# Patient Record
Sex: Female | Born: 1945 | Race: White | Hispanic: No | Marital: Married | Smoking: Never smoker
Health system: Southern US, Community
[De-identification: ages and names within clinical notes are randomized; demographics above are authoritative.]

## PROBLEM LIST (undated history)

## (undated) DIAGNOSIS — E785 Hyperlipidemia, unspecified: Secondary | ICD-10-CM

## (undated) DIAGNOSIS — I10 Essential (primary) hypertension: Secondary | ICD-10-CM

## (undated) DIAGNOSIS — M8580 Other specified disorders of bone density and structure, unspecified site: Secondary | ICD-10-CM

## (undated) DIAGNOSIS — M810 Age-related osteoporosis without current pathological fracture: Secondary | ICD-10-CM

## (undated) HISTORY — DX: Essential (primary) hypertension: I10

## (undated) HISTORY — DX: Hyperlipidemia, unspecified: E78.5

## (undated) HISTORY — DX: Age-related osteoporosis without current pathological fracture: M81.0

## (undated) HISTORY — DX: Other specified disorders of bone density and structure, unspecified site: M85.80

## (undated) HISTORY — PX: TUBAL LIGATION: SHX77

---

## 2000-01-21 ENCOUNTER — Other Ambulatory Visit: Admission: RE | Admit: 2000-01-21 | Discharge: 2000-01-21 | Payer: Self-pay | Admitting: Obstetrics and Gynecology

## 2000-01-21 ENCOUNTER — Encounter (INDEPENDENT_AMBULATORY_CARE_PROVIDER_SITE_OTHER): Payer: Self-pay

## 2003-12-13 ENCOUNTER — Encounter: Admission: RE | Admit: 2003-12-13 | Discharge: 2003-12-13 | Payer: Self-pay | Admitting: Family Medicine

## 2005-02-12 ENCOUNTER — Ambulatory Visit: Payer: Self-pay | Admitting: Family Medicine

## 2005-06-01 ENCOUNTER — Emergency Department (HOSPITAL_COMMUNITY): Admission: EM | Admit: 2005-06-01 | Discharge: 2005-06-01 | Payer: Self-pay | Admitting: Emergency Medicine

## 2005-06-04 ENCOUNTER — Encounter (HOSPITAL_COMMUNITY): Admission: RE | Admit: 2005-06-04 | Discharge: 2005-09-02 | Payer: Self-pay | Admitting: *Deleted

## 2005-09-15 ENCOUNTER — Ambulatory Visit: Payer: Self-pay | Admitting: Family Medicine

## 2005-09-16 ENCOUNTER — Ambulatory Visit: Payer: Self-pay | Admitting: Family Medicine

## 2005-09-23 ENCOUNTER — Ambulatory Visit: Payer: Self-pay | Admitting: Gastroenterology

## 2005-10-08 ENCOUNTER — Ambulatory Visit: Payer: Self-pay | Admitting: Family Medicine

## 2005-12-02 ENCOUNTER — Ambulatory Visit: Payer: Self-pay | Admitting: Gastroenterology

## 2005-12-02 LAB — HM COLONOSCOPY

## 2006-05-20 ENCOUNTER — Ambulatory Visit: Payer: Self-pay | Admitting: Internal Medicine

## 2006-11-23 ENCOUNTER — Ambulatory Visit: Payer: Self-pay | Admitting: Family Medicine

## 2006-11-23 LAB — CONVERTED CEMR LAB
ALT: 18 units/L (ref 0–40)
Alkaline Phosphatase: 46 units/L (ref 39–117)
BUN: 12 mg/dL (ref 6–23)
Basophils Relative: 0.9 % (ref 0.0–1.0)
Calcium: 9.2 mg/dL (ref 8.4–10.5)
GFR calc Af Amer: 94 mL/min
Glucose, Bld: 92 mg/dL (ref 70–99)
Hemoglobin: 12.6 g/dL (ref 12.0–15.0)
Lymphocytes Relative: 26.3 % (ref 12.0–46.0)
Platelets: 304 10*3/uL (ref 150–400)
Potassium: 3.7 meq/L (ref 3.5–5.1)
TSH: 1.81 microintl units/mL (ref 0.35–5.50)
Total Protein: 6.8 g/dL (ref 6.0–8.3)
WBC: 5.1 10*3/uL (ref 4.5–10.5)

## 2007-02-20 ENCOUNTER — Ambulatory Visit: Payer: Self-pay | Admitting: Family Medicine

## 2007-02-20 LAB — CONVERTED CEMR LAB
ALT: 21 units/L (ref 0–40)
AST: 22 units/L (ref 0–37)
Albumin: 4.1 g/dL (ref 3.5–5.2)
Alkaline Phosphatase: 51 units/L (ref 39–117)
BUN: 16 mg/dL (ref 6–23)
Bilirubin, Direct: 0.1 mg/dL (ref 0.0–0.3)
Calcium: 9.2 mg/dL (ref 8.4–10.5)
Chloride: 104 meq/L (ref 96–112)
GFR calc non Af Amer: 91 mL/min
Glucose, Bld: 94 mg/dL (ref 70–99)

## 2007-02-28 DIAGNOSIS — Z9189 Other specified personal risk factors, not elsewhere classified: Secondary | ICD-10-CM | POA: Insufficient documentation

## 2007-02-28 DIAGNOSIS — E785 Hyperlipidemia, unspecified: Secondary | ICD-10-CM | POA: Insufficient documentation

## 2007-02-28 DIAGNOSIS — I10 Essential (primary) hypertension: Secondary | ICD-10-CM | POA: Insufficient documentation

## 2007-02-28 DIAGNOSIS — Z8719 Personal history of other diseases of the digestive system: Secondary | ICD-10-CM | POA: Insufficient documentation

## 2007-06-27 ENCOUNTER — Ambulatory Visit: Payer: Self-pay | Admitting: Family Medicine

## 2007-06-27 DIAGNOSIS — J209 Acute bronchitis, unspecified: Secondary | ICD-10-CM | POA: Insufficient documentation

## 2007-06-28 ENCOUNTER — Ambulatory Visit: Payer: Self-pay | Admitting: Family Medicine

## 2007-09-04 ENCOUNTER — Ambulatory Visit: Payer: Self-pay | Admitting: Family Medicine

## 2007-09-07 LAB — CONVERTED CEMR LAB
LDL Cholesterol: 89 mg/dL (ref 0–99)
Total CHOL/HDL Ratio: 2.5
VLDL: 10 mg/dL (ref 0–40)

## 2007-09-26 ENCOUNTER — Emergency Department (HOSPITAL_COMMUNITY): Admission: EM | Admit: 2007-09-26 | Discharge: 2007-09-26 | Payer: Self-pay | Admitting: *Deleted

## 2007-09-26 ENCOUNTER — Telehealth: Payer: Self-pay | Admitting: Family Medicine

## 2007-10-10 ENCOUNTER — Ambulatory Visit: Payer: Self-pay | Admitting: Family Medicine

## 2007-10-10 DIAGNOSIS — M542 Cervicalgia: Secondary | ICD-10-CM | POA: Insufficient documentation

## 2008-01-05 ENCOUNTER — Telehealth (INDEPENDENT_AMBULATORY_CARE_PROVIDER_SITE_OTHER): Payer: Self-pay | Admitting: *Deleted

## 2008-01-16 ENCOUNTER — Telehealth (INDEPENDENT_AMBULATORY_CARE_PROVIDER_SITE_OTHER): Payer: Self-pay | Admitting: *Deleted

## 2008-02-08 ENCOUNTER — Telehealth (INDEPENDENT_AMBULATORY_CARE_PROVIDER_SITE_OTHER): Payer: Self-pay | Admitting: *Deleted

## 2008-02-20 ENCOUNTER — Telehealth (INDEPENDENT_AMBULATORY_CARE_PROVIDER_SITE_OTHER): Payer: Self-pay | Admitting: *Deleted

## 2008-03-12 ENCOUNTER — Telehealth (INDEPENDENT_AMBULATORY_CARE_PROVIDER_SITE_OTHER): Payer: Self-pay | Admitting: *Deleted

## 2008-03-25 ENCOUNTER — Encounter: Payer: Self-pay | Admitting: Family Medicine

## 2008-05-16 ENCOUNTER — Ambulatory Visit: Payer: Self-pay | Admitting: Family Medicine

## 2008-05-20 ENCOUNTER — Encounter (INDEPENDENT_AMBULATORY_CARE_PROVIDER_SITE_OTHER): Payer: Self-pay | Admitting: *Deleted

## 2008-05-20 LAB — CONVERTED CEMR LAB
AST: 21 units/L (ref 0–37)
Albumin: 4 g/dL (ref 3.5–5.2)
Alkaline Phosphatase: 43 units/L (ref 39–117)
Bilirubin, Direct: 0.1 mg/dL (ref 0.0–0.3)
Cholesterol: 141 mg/dL (ref 0–200)
Total Protein: 6.5 g/dL (ref 6.0–8.3)
Triglycerides: 40 mg/dL (ref 0–149)

## 2008-05-21 ENCOUNTER — Telehealth (INDEPENDENT_AMBULATORY_CARE_PROVIDER_SITE_OTHER): Payer: Self-pay | Admitting: *Deleted

## 2008-06-04 ENCOUNTER — Telehealth (INDEPENDENT_AMBULATORY_CARE_PROVIDER_SITE_OTHER): Payer: Self-pay | Admitting: *Deleted

## 2008-06-04 ENCOUNTER — Ambulatory Visit: Payer: Self-pay | Admitting: Family Medicine

## 2008-06-28 ENCOUNTER — Telehealth (INDEPENDENT_AMBULATORY_CARE_PROVIDER_SITE_OTHER): Payer: Self-pay | Admitting: *Deleted

## 2008-07-09 ENCOUNTER — Telehealth: Payer: Self-pay | Admitting: Family Medicine

## 2008-07-19 ENCOUNTER — Telehealth (INDEPENDENT_AMBULATORY_CARE_PROVIDER_SITE_OTHER): Payer: Self-pay | Admitting: *Deleted

## 2008-07-26 ENCOUNTER — Ambulatory Visit: Payer: Self-pay | Admitting: Family Medicine

## 2008-07-26 DIAGNOSIS — N951 Menopausal and female climacteric states: Secondary | ICD-10-CM | POA: Insufficient documentation

## 2008-07-29 ENCOUNTER — Encounter: Admission: RE | Admit: 2008-07-29 | Discharge: 2008-07-29 | Payer: Self-pay | Admitting: Family Medicine

## 2008-07-30 ENCOUNTER — Telehealth (INDEPENDENT_AMBULATORY_CARE_PROVIDER_SITE_OTHER): Payer: Self-pay | Admitting: *Deleted

## 2008-08-01 ENCOUNTER — Encounter (INDEPENDENT_AMBULATORY_CARE_PROVIDER_SITE_OTHER): Payer: Self-pay | Admitting: *Deleted

## 2008-08-07 ENCOUNTER — Encounter (INDEPENDENT_AMBULATORY_CARE_PROVIDER_SITE_OTHER): Payer: Self-pay | Admitting: *Deleted

## 2008-08-12 ENCOUNTER — Encounter: Payer: Self-pay | Admitting: Family Medicine

## 2008-08-14 ENCOUNTER — Ambulatory Visit: Payer: Self-pay | Admitting: Family Medicine

## 2008-08-26 ENCOUNTER — Telehealth (INDEPENDENT_AMBULATORY_CARE_PROVIDER_SITE_OTHER): Payer: Self-pay | Admitting: *Deleted

## 2008-08-27 ENCOUNTER — Telehealth (INDEPENDENT_AMBULATORY_CARE_PROVIDER_SITE_OTHER): Payer: Self-pay | Admitting: *Deleted

## 2009-01-08 ENCOUNTER — Telehealth (INDEPENDENT_AMBULATORY_CARE_PROVIDER_SITE_OTHER): Payer: Self-pay | Admitting: *Deleted

## 2009-02-24 ENCOUNTER — Encounter: Payer: Self-pay | Admitting: Family Medicine

## 2009-02-28 ENCOUNTER — Ambulatory Visit: Payer: Self-pay | Admitting: Family Medicine

## 2009-03-04 ENCOUNTER — Encounter (INDEPENDENT_AMBULATORY_CARE_PROVIDER_SITE_OTHER): Payer: Self-pay | Admitting: *Deleted

## 2009-03-05 ENCOUNTER — Encounter: Payer: Self-pay | Admitting: Family Medicine

## 2009-03-17 ENCOUNTER — Encounter (INDEPENDENT_AMBULATORY_CARE_PROVIDER_SITE_OTHER): Payer: Self-pay | Admitting: *Deleted

## 2009-04-12 ENCOUNTER — Ambulatory Visit: Payer: Self-pay | Admitting: Internal Medicine

## 2009-04-12 ENCOUNTER — Telehealth: Payer: Self-pay | Admitting: Internal Medicine

## 2009-04-12 DIAGNOSIS — N390 Urinary tract infection, site not specified: Secondary | ICD-10-CM | POA: Insufficient documentation

## 2009-04-12 LAB — CONVERTED CEMR LAB
Glucose, Urine, Semiquant: NEGATIVE
Ketones, urine, test strip: NEGATIVE
Nitrite: NEGATIVE

## 2009-09-22 ENCOUNTER — Ambulatory Visit: Payer: Self-pay | Admitting: Family Medicine

## 2009-10-09 ENCOUNTER — Ambulatory Visit: Payer: Self-pay | Admitting: Family Medicine

## 2009-10-09 DIAGNOSIS — J019 Acute sinusitis, unspecified: Secondary | ICD-10-CM | POA: Insufficient documentation

## 2009-10-16 ENCOUNTER — Telehealth: Payer: Self-pay | Admitting: Family Medicine

## 2009-10-20 ENCOUNTER — Telehealth: Payer: Self-pay | Admitting: Family Medicine

## 2009-10-20 ENCOUNTER — Telehealth (INDEPENDENT_AMBULATORY_CARE_PROVIDER_SITE_OTHER): Payer: Self-pay | Admitting: *Deleted

## 2009-11-13 ENCOUNTER — Telehealth: Payer: Self-pay | Admitting: Family Medicine

## 2010-01-10 ENCOUNTER — Emergency Department (HOSPITAL_COMMUNITY): Admission: EM | Admit: 2010-01-10 | Discharge: 2010-01-10 | Payer: Self-pay | Admitting: Emergency Medicine

## 2010-01-12 ENCOUNTER — Encounter: Payer: Self-pay | Admitting: Family Medicine

## 2010-01-12 ENCOUNTER — Telehealth: Payer: Self-pay | Admitting: Family Medicine

## 2010-01-23 ENCOUNTER — Telehealth (INDEPENDENT_AMBULATORY_CARE_PROVIDER_SITE_OTHER): Payer: Self-pay | Admitting: *Deleted

## 2010-01-30 ENCOUNTER — Encounter: Payer: Self-pay | Admitting: Family Medicine

## 2010-03-03 ENCOUNTER — Ambulatory Visit: Payer: Self-pay | Admitting: Family Medicine

## 2010-03-03 ENCOUNTER — Encounter (INDEPENDENT_AMBULATORY_CARE_PROVIDER_SITE_OTHER): Payer: Self-pay | Admitting: *Deleted

## 2010-03-03 DIAGNOSIS — M949 Disorder of cartilage, unspecified: Secondary | ICD-10-CM

## 2010-03-03 DIAGNOSIS — M899 Disorder of bone, unspecified: Secondary | ICD-10-CM | POA: Insufficient documentation

## 2010-03-20 ENCOUNTER — Encounter: Payer: Self-pay | Admitting: Family Medicine

## 2010-03-27 ENCOUNTER — Ambulatory Visit: Payer: Self-pay | Admitting: Family Medicine

## 2010-03-27 LAB — CONVERTED CEMR LAB: Fecal Occult Bld: NEGATIVE

## 2010-07-24 ENCOUNTER — Ambulatory Visit: Payer: Self-pay | Admitting: Family Medicine

## 2010-10-09 ENCOUNTER — Telehealth (INDEPENDENT_AMBULATORY_CARE_PROVIDER_SITE_OTHER): Payer: Self-pay | Admitting: *Deleted

## 2010-10-12 ENCOUNTER — Telehealth: Payer: Self-pay | Admitting: Family Medicine

## 2010-10-14 ENCOUNTER — Ambulatory Visit: Payer: Self-pay | Admitting: Family Medicine

## 2010-10-19 LAB — CONVERTED CEMR LAB
ALT: 16 units/L (ref 0–35)
Cholesterol: 154 mg/dL (ref 0–200)
LDL Cholesterol: 94 mg/dL (ref 0–99)
Total Bilirubin: 0.3 mg/dL (ref 0.3–1.2)
Triglycerides: 39 mg/dL (ref 0.0–149.0)

## 2010-12-06 LAB — CONVERTED CEMR LAB
ALT: 21 units/L (ref 0–35)
AST: 23 units/L (ref 0–37)
Albumin: 4.3 g/dL (ref 3.5–5.2)
BUN: 11 mg/dL (ref 6–23)
BUN: 13 mg/dL (ref 6–23)
BUN: 13 mg/dL (ref 6–23)
Basophils Absolute: 0 10*3/uL (ref 0.0–0.1)
Bilirubin Urine: NEGATIVE
Bilirubin, Direct: 0.1 mg/dL (ref 0.0–0.3)
CO2: 30 meq/L (ref 19–32)
CO2: 31 meq/L (ref 19–32)
Chloride: 103 meq/L (ref 96–112)
Chloride: 99 meq/L (ref 96–112)
Cholesterol: 149 mg/dL (ref 0–200)
Cholesterol: 157 mg/dL (ref 0–200)
Creatinine, Ser: 0.6 mg/dL (ref 0.4–1.2)
Eosinophils Absolute: 0.2 10*3/uL (ref 0.0–0.7)
Eosinophils Absolute: 0.3 10*3/uL (ref 0.0–0.7)
Eosinophils Absolute: 0.3 10*3/uL (ref 0.0–0.7)
Eosinophils Relative: 5 % (ref 0.0–5.0)
Eosinophils Relative: 5.4 % — ABNORMAL HIGH (ref 0.0–5.0)
GFR calc Af Amer: 131 mL/min
GFR calc non Af Amer: 108 mL/min
Glucose, Bld: 77 mg/dL (ref 70–99)
Glucose, Bld: 84 mg/dL (ref 70–99)
Glucose, Urine, Semiquant: NEGATIVE
HCT: 35.5 % — ABNORMAL LOW (ref 36.0–46.0)
HCT: 35.9 % — ABNORMAL LOW (ref 36.0–46.0)
Hemoglobin: 12.2 g/dL (ref 12.0–15.0)
Ketones, urine, test strip: NEGATIVE
LDL Cholesterol: 82 mg/dL (ref 0–99)
Lymphs Abs: 1.3 10*3/uL (ref 0.7–4.0)
Lymphs Abs: 1.3 10*3/uL (ref 0.7–4.0)
MCHC: 34.4 g/dL (ref 30.0–36.0)
MCHC: 34.7 g/dL (ref 30.0–36.0)
MCV: 94.9 fL (ref 78.0–100.0)
MCV: 95 fL (ref 78.0–100.0)
MCV: 95.4 fL (ref 78.0–100.0)
Monocytes Absolute: 0.4 10*3/uL (ref 0.1–1.0)
Monocytes Absolute: 0.5 10*3/uL (ref 0.1–1.0)
Monocytes Absolute: 0.6 10*3/uL (ref 0.1–1.0)
Monocytes Relative: 9.3 % (ref 3.0–12.0)
Neutro Abs: 2.7 10*3/uL (ref 1.4–7.7)
Neutrophils Relative %: 57.8 % (ref 43.0–77.0)
Neutrophils Relative %: 63.3 % (ref 43.0–77.0)
Nitrite: NEGATIVE
Pap Smear: NORMAL
Platelets: 259 10*3/uL (ref 150.0–400.0)
Platelets: 319 10*3/uL (ref 150–400)
Potassium: 3.6 meq/L (ref 3.5–5.1)
Potassium: 3.7 meq/L (ref 3.5–5.1)
Potassium: 4 meq/L (ref 3.5–5.1)
RDW: 12.5 % (ref 11.5–14.6)
RDW: 13 % (ref 11.5–14.6)
RDW: 14.3 % (ref 11.5–14.6)
Sodium: 139 meq/L (ref 135–145)
Sodium: 139 meq/L (ref 135–145)
Specific Gravity, Urine: <1.005
TSH: 1.4 microintl units/mL (ref 0.35–5.50)
TSH: 1.45 microintl units/mL (ref 0.35–5.50)
Total Bilirubin: 0.7 mg/dL (ref 0.3–1.2)
Total Protein: 6.7 g/dL (ref 6.0–8.3)
Triglycerides: 54 mg/dL (ref 0.0–149.0)
Urobilinogen, UA: 0.2
Urobilinogen, UA: NEGATIVE
Vit D, 25-Hydroxy: 65 ng/mL (ref 30–89)
Vit D, 25-Hydroxy: 77 ng/mL (ref 30–89)
WBC Urine, dipstick: NEGATIVE
WBC: 5.9 10*3/uL (ref 4.5–10.5)
WBC: 6 10*3/uL (ref 4.5–10.5)
pH: 6
pH: 8

## 2010-12-10 NOTE — Progress Notes (Signed)
Summary: REFILL  Phone Note Refill Request Message from:  Patient on October 09, 2010 11:58 AM  Refills Requested: Medication #1:  LIPITOR 20 MG  TABS 1 by mouth at bedtime [facility] - 90 DAY  SUPPLY FAX  [phone]  Initial call taken by: Okey Regal Spring,  October 09, 2010 12:00 PM    New/Updated Medications: LIPITOR 20 MG  TABS (ATORVASTATIN CALCIUM) 1 by mouth at bedtime ** LABS ARE DUE NOW***  Prescriptions: LIPITOR 20 MG  TABS (ATORVASTATIN CALCIUM) 1 by mouth at bedtime ** LABS ARE DUE NOW***  #90 x 0   Entered by:   Almeta Monas CMA (AAMA)   Authorized by:   Loreen Freud DO   Signed by:   Almeta Monas CMA (AAMA) on 10/09/2010   Method used:   Electronically to        [facility]* (retail)             ,          Ph: [phone]       Fax: [phone]   RxID:   [id number]

## 2010-12-10 NOTE — Progress Notes (Signed)
Summary: Lipitor  Phone Note Call from Patient Call back at Work Phone [phone]   Summary of Call: Patient called for samples of Lipitor.   I called to notify that we do not get samples of the med anymore, left message on voicemail to call back to office.  Initial call taken by: Lucious Groves CMA,  October 12, 2010 9:56 AM  Follow-up for Phone Call        Patient notified and prescription sent. Follow-up by: Lucious Groves CMA,  October 12, 2010 10:30 AM    Prescriptions: LIPITOR 20 MG  TABS (ATORVASTATIN CALCIUM) 1 by mouth at bedtime ** LABS ARE DUE NOW***  #30 x 0   Entered by:   Lucious Groves CMA   Authorized by:   Loreen Freud DO   Signed by:   Lucious Groves CMA on 10/12/2010   Method used:   Electronically to        [phone][facility] (retail)       [address] [place], [place]  [address]       Ph: [phone]       Fax: [phone]   RxID:   [id number]

## 2010-12-10 NOTE — Assessment & Plan Note (Signed)
Summary: cpx//lch   Vital Signs:  Patient profile:   65 year old female Height:      62 inches Weight:      123 pounds BMI:     22.58 Pulse rate:   86 / minute Pulse rhythm:   regular BP sitting:   124 / 80  (left arm) Cuff size:   regular  Vitals Entered By: Army Fossa CMA (March 03, 2010 9:01 AM) CC: Pt here for CPX. no pap. has a gyn.   History of Present Illness: Pt here for cpe and labs.  Pt has gyn.   No complaints.    Preventive Screening-Counseling & Management  Alcohol-Tobacco     Alcohol drinks/day: <1     Alcohol type: wine     Smoking Status: never     Passive Smoke Exposure: no  Caffeine-Diet-Exercise     Caffeine use/day: 6 cups coffee     Caffeine Counseling: decrease use of caffeine     Does Patient Exercise: yes     Type of exercise: walking, yoga, curves     Exercise (avg: min/session): 30-60     Times/week: 5     Exercise Counseling: not indicated; exercise is adequate  Hep-HIV-STD-Contraception     HIV Risk: no     Dental Visit-last 6 months yes     Dental Care Counseling: not indicated; dental care within six months     SBE monthly: yes     SBE Education/Counseling: not indicated; SBE done regularly     Sun Exposure-Excessive: no     Sun Exposure Counseling: not indicated; sun exposure is acceptable  Safety-Violence-Falls     Seat Belt Use: yes     Firearms in the Home: no firearms in the home     Smoke Detectors: yes     Violence in the Home: no risk noted     Sexual Abuse: no  Current Medications (verified): 1)  Aspirin Ec Lo-Dose 81 Mg Tbec (Aspirin) 2)  Wellbutrin Xl 150 Mg Xr24h-Tab (Bupropion Hcl) .Marland Kitchen.. 1 By Mouth Qam. 3)  Lisinopril-Hydrochlorothiazide 20-25 Mg Tabs (Lisinopril-Hydrochlorothiazide) .... Take One Tablet Daily 4)  Lipitor 20 Mg  Tabs (Atorvastatin Calcium) .Marland Kitchen.. 1 By Mouth At Bedtime 5)  Vit-E 400 .Marland Kitchen.. 1 By Mouth Once Daily 6)  Fish Oil 1000 Mg  Caps (Omega-3 Fatty Acids) .Marland Kitchen.. 1 By Mouth Once Daily 7)  Soma 250  Mg  Tabs (Carisoprodol) .Marland Kitchen.. 1 By Mouth At Bedtime As Needed 8)  Claritin 10 Mg Caps (Loratadine) .Marland Kitchen.. 1 By Mouth Once Daily As Needed 9)  Vagifem 25 Mcg Tabs (Estradiol) .... Use As Directed 10)  Vitamin D 1000 Unit Tabs (Cholecalciferol) 11)  Calcium 1500 Mg Tabs (Calcium Carbonate) 12)  Vivelle-Dot 0.0375 Mg/24hr Pttw (Estradiol) 13)  Nasacort Aq 55 Mcg/act Aers (Triamcinolone Acetonide(Nasal)) .... 2 Sprays Each Nostril Once Daily  Allergies (verified): No Known Drug Allergies  Past History:  Past Medical History: Last updated: 07/26/2008 Hyperlipidemia Hypertension Current Problems:  PREVENTIVE HEALTH CARE (ICD-V70.0) FAMILY HISTORY DIABETES 1ST DEGREE RELATIVE (ICD-V18.0) FAMILY HISTORY OF CERVICAL CANCER (ICD-V17.3) FAMILY HISTORY OF CAD FEMALE 1ST DEGREE RELATIVE <50 (ICD-V17.3) FAMILY HISTORY OF CAD FEMALE 1ST DEGREE RELATIVE <60 (ICD-V16.49) NECK PAIN, LEFT (ICD-723.1) BRONCHITIS, ACUTE (ICD-466.0) BREAST BIOPSY, HX OF (ICD-V15.9) COLONOSCOPY, HX OF (ICD-V12.79) S/P PT DOC HAVE DEXA (CPT-G8340) HYPERTENSION (ICD-401.9) HYPERLIPIDEMIA (ICD-272.4)  Past Surgical History: Last updated: 07/26/2008 Denies surgical history  Family History: Last updated: 02/28/2009 Family History of CAD Female 1st degree relative <60 Family  History of CAD Female 1st degree relative <50 Family History of Cervical cancer Family History Diabetes 1st degree relative Family History Hypertension Family History Lung cancer M-dementia  Social History: Last updated: 07/26/2008 Never Smoked Alcohol use-yes Retired Drug use-no Married Regular exercise-no  Risk Factors: Alcohol Use: <1 (03/03/2010) Caffeine Use: 6 cups coffee (03/03/2010) Exercise: yes (03/03/2010)  Risk Factors: Smoking Status: never (03/03/2010) Passive Smoke Exposure: no (03/03/2010)  Family History: Reviewed history from 02/28/2009 and no changes required. Family History of CAD Female 1st degree relative  <60 Family History of CAD Female 1st degree relative <50 Family History of Cervical cancer Family History Diabetes 1st degree relative Family History Hypertension Family History Lung cancer M-dementia  Social History: Reviewed history from 07/26/2008 and no changes required. Never Smoked Alcohol use-yes Retired Drug use-no Married Regular exercise-no  Review of Systems      See HPI General:  Denies chills, fatigue, fever, loss of appetite, malaise, sleep disorder, sweats, weakness, and weight loss. Eyes:  Denies blurring, discharge, double vision, eye irritation, eye pain, halos, itching, light sensitivity, red eye, vision loss-1 eye, and vision loss-both eyes; optho--no. ENT:  Denies decreased hearing, difficulty swallowing, ear discharge, earache, hoarseness, nasal congestion, nosebleeds, postnasal drainage, ringing in ears, sinus pressure, and sore throat. CV:  Denies bluish discoloration of lips or nails, chest pain or discomfort, difficulty breathing at night, difficulty breathing while lying down, fainting, fatigue, leg cramps with exertion, lightheadness, near fainting, palpitations, shortness of breath with exertion, swelling of feet, swelling of hands, and weight gain. Resp:  Denies chest discomfort, chest pain with inspiration, cough, coughing up blood, excessive snoring, hypersomnolence, morning headaches, pleuritic, shortness of breath, sputum productive, and wheezing. GI:  Denies abdominal pain, bloody stools, change in bowel habits, constipation, dark tarry stools, diarrhea, excessive appetite, gas, hemorrhoids, indigestion, and loss of appetite. GU:  Denies abnormal vaginal bleeding, decreased libido, discharge, dysuria, genital sores, hematuria, incontinence, nocturia, urinary frequency, and urinary hesitancy. MS:  Denies joint pain, joint redness, joint swelling, loss of strength, low back pain, mid back pain, muscle aches, muscle , cramps, muscle weakness, stiffness, and  thoracic pain. Derm:  Denies changes in color of skin, changes in nail beds, dryness, excessive perspiration, flushing, hair loss, insect bite(s), itching, lesion(s), poor wound healing, and rash. Neuro:  Denies brief paralysis, difficulty with concentration, disturbances in coordination, falling down, headaches, inability to speak, memory loss, numbness, poor balance, seizures, sensation of room spinning, tingling, tremors, visual disturbances, and weakness. Psych:  Denies alternate hallucination ( auditory/visual), anxiety, depression, easily angered, easily tearful, irritability, mental problems, panic attacks, sense of great danger, suicidal thoughts/plans, thoughts of violence, unusual visions or sounds, and thoughts /plans of harming others. Endo:  Denies cold intolerance, excessive hunger, excessive thirst, excessive urination, heat intolerance, polyuria, and weight change. Heme:  Denies abnormal bruising, bleeding, enlarge lymph nodes, fevers, pallor, and skin discoloration. Allergy:  Denies hives or rash, itching eyes, persistent infections, seasonal allergies, and sneezing.  Physical Exam  General:  Well-developed,well-nourished,in no acute distress; alert,appropriate and cooperative throughout examination Head:  Normocephalic and atraumatic without obvious abnormalities. No apparent alopecia or balding. Eyes:  pupils equal, pupils round, pupils reactive to light, and no injection.   Ears:  + tubes b/L no external deformities.   Nose:  External nasal examination shows no deformity or inflammation. Nasal mucosa are pink and moist without lesions or exudates. Mouth:  Oral mucosa and oropharynx without lesions or exudates.  Teeth in good repair. Neck:  No deformities, masses,  or tenderness noted. Breasts:  gyn Lungs:  Normal respiratory effort, chest expands symmetrically. Lungs are clear to auscultation, no crackles or wheezes. Heart:  normal rate and no murmur.   Abdomen:  Bowel sounds  positive,abdomen soft and non-tender without masses, organomegaly or hernias noted. Rectal:  gyn Genitalia:  gyn Msk:  normal ROM, no joint tenderness, no joint swelling, no joint warmth, no redness over joints, no joint deformities, no joint instability, and no crepitation.   Pulses:  R posterior tibial normal, R dorsalis pedis normal, R carotid normal, L posterior tibial normal, L dorsalis pedis normal, and L carotid normal.   Extremities:  No clubbing, cyanosis, edema, or deformity noted with normal full range of motion of all joints.   Neurologic:  No cranial nerve deficits noted. Station and gait are normal. Plantar reflexes are down-going bilaterally. DTRs are symmetrical throughout. Sensory, motor and coordinative functions appear intact. Skin:  Intact without suspicious lesions or rashes Cervical Nodes:  No lymphadenopathy noted Psych:  Cognition and judgment appear intact. Alert and cooperative with normal attention span and concentration. No apparent delusions, illusions, hallucinations   Impression & Recommendations:  Problem # 1:  PREVENTIVE HEALTH CARE (ICD-V70.0)  Orders: Venipuncture (45409) TLB-Lipid Panel (80061-LIPID) TLB-BMP (Basic Metabolic Panel-BMET) (80048-METABOL) TLB-CBC Platelet - w/Differential (85025-CBCD) TLB-Hepatic/Liver Function Pnl (80076-HEPATIC) TLB-TSH (Thyroid Stimulating Hormone) (84443-TSH) T-Vitamin D (25-Hydroxy) (81191-47829) EKG w/ Interpretation (93000)  Problem # 2:  OSTEOPENIA (ICD-733.90)  Her updated medication list for this problem includes:    Vitamin D 1000 Unit Tabs (Cholecalciferol)    Calcium 1500 Mg Tabs (Calcium carbonate)  Bone Density: Location:  [facility].    (03/16/2009) Vit D:77 (02/28/2009)  Orders: EKG w/ Interpretation (93000)  Problem # 3:  POSTMENOPAUSAL STATUS (ICD-627.2)  Her updated medication list for this problem includes:    Vagifem 25 Mcg Tabs (Estradiol) ..... Use as directed     Vivelle-dot 0.0375 Mg/24hr Pttw (Estradiol)  Orders: Venipuncture ([address]) TLB-Lipid Panel (80061-LIPID) TLB-BMP (Basic Metabolic Panel-BMET) (80048-METABOL) TLB-CBC Platelet - w/Differential (85025-CBCD) TLB-Hepatic/Liver Function Pnl (80076-HEPATIC) TLB-TSH (Thyroid Stimulating Hormone) (84443-TSH) T-Vitamin D (25-Hydroxy) (08657-84696) EKG w/ Interpretation (93000)  Discussed treatment options.   Problem # 4:  HYPERTENSION (ICD-401.9)  Her updated medication list for this problem includes:    Lisinopril-hydrochlorothiazide 20-25 Mg Tabs (Lisinopril-hydrochlorothiazide) .Marland Kitchen... Take one tablet daily  Orders: Venipuncture (29528) TLB-Lipid Panel (80061-LIPID) TLB-BMP (Basic Metabolic Panel-BMET) (80048-METABOL) TLB-CBC Platelet - w/Differential (85025-CBCD) TLB-Hepatic/Liver Function Pnl (80076-HEPATIC) TLB-TSH (Thyroid Stimulating Hormone) (84443-TSH) T-Vitamin D (25-Hydroxy) (41324-40102) EKG w/ Interpretation (93000)  BP today: 124/80 Prior BP: 120/80 (10/09/2009)  Labs Reviewed: K+: 3.7 (02/28/2009) Creat: : 0.6 (02/28/2009)   Chol: 157 (02/28/2009)   HDL: 63.80 (02/28/2009)   LDL: 82 (02/28/2009)   TG: 54.0 (02/28/2009)  Problem # 5:  HYPERLIPIDEMIA (ICD-272.4)  Her updated medication list for this problem includes:    Lipitor 20 Mg Tabs (Atorvastatin calcium) .Marland Kitchen... 1 by mouth at bedtime  Orders: Venipuncture (72536) TLB-Lipid Panel (80061-LIPID) TLB-BMP (Basic Metabolic Panel-BMET) (80048-METABOL) TLB-CBC Platelet - w/Differential (85025-CBCD) TLB-Hepatic/Liver Function Pnl (80076-HEPATIC) TLB-TSH (Thyroid Stimulating Hormone) (84443-TSH) T-Vitamin D (25-Hydroxy) (64403-47425) EKG w/ Interpretation (93000)  Labs Reviewed: SGOT: 24 (02/28/2009)   SGPT: 21 (02/28/2009)   HDL:63.80 (02/28/2009), 62.5 (05/16/2008)  LDL:82 (02/28/2009), 71 (05/16/2008)  Chol:157 (02/28/2009), 141 (05/16/2008)  Trig:54.0 (02/28/2009), 40 (05/16/2008)  Complete Medication List: 1)   Aspirin Ec Lo-dose 81 Mg Tbec (Aspirin) 2)  Wellbutrin Xl 150 Mg Xr24h-tab (Bupropion hcl) .Marland Kitchen.. 1 by mouth qam. 3)  Lisinopril-hydrochlorothiazide  20-25 Mg Tabs (Lisinopril-hydrochlorothiazide) .... Take one tablet daily 4)  Lipitor 20 Mg Tabs (Atorvastatin calcium) .Marland Kitchen.. 1 by mouth at bedtime 5)  Vit-e 400  .Marland KitchenMarland Kitchen. 1 by mouth once daily 6)  Fish Oil 1000 Mg Caps (Omega-3 fatty acids) .Marland Kitchen.. 1 by mouth once daily 7)  Soma 250 Mg Tabs (Carisoprodol) .Marland Kitchen.. 1 by mouth at bedtime as needed 8)  Claritin 10 Mg Caps (Loratadine) .Marland Kitchen.. 1 by mouth once daily as needed 9)  Vagifem 25 Mcg Tabs (Estradiol) .... Use as directed 10)  Vitamin D 1000 Unit Tabs (Cholecalciferol) 11)  Calcium 1500 Mg Tabs (Calcium carbonate) 12)  Vivelle-dot 0.0375 Mg/24hr Pttw (Estradiol) 13)  Nasacort Aq 55 Mcg/act Aers (Triamcinolone acetonide(nasal)) .... 2 sprays each nostril once daily Prescriptions: LIPITOR 20 MG  TABS (ATORVASTATIN CALCIUM) 1 by mouth at bedtime  #30 x 5   Entered and Authorized by:   Loreen Freud DO   Signed by:   Loreen Freud DO on 03/03/2010   Method used:   Print then Give to Patient   RxID:   [id number]    EKG  Procedure date:  03/03/2010  Findings:      Sinus bradycardia with rate of:  58  Bone Density  Procedure date:  03/16/2009  Findings:      Location:  [facility].     Comments:      Assessment:  Osteopenia.     PAP Result Date:  02/19/2009 PAP Result:  normal PAP Next Due:  1 yr Last Mammogram:  Normal Bilateral (05/22/2008 2:02:23 PM) Mammogram Result Date:  03/02/2010 Mammogram Result:  normal Mammogram Next Due:  1 yr      Appended Document: cpx//lch  Laboratory Results   Urine Tests   Date/Time Reported: March 03, 2010 1:48 PM   Routine Urinalysis   Color: yellow Appearance: Clear Glucose: negative   (Normal Range: Negative) Bilirubin: negative   (Normal Range: Negative) Ketone: negative   (Normal Range: Negative) Spec. Gravity:  <1.005   (Normal Range: 1.003-1.035) Blood: negative   (Normal Range: Negative) pH: 6.0   (Normal Range: 5.0-8.0) Protein: negative   (Normal Range: Negative) Urobilinogen: negative   (Normal Range: 0-1) Nitrite: negative   (Normal Range: Negative) Leukocyte Esterace: negative   (Normal Range: Negative)    Comments: Floydene Flock  March 03, 2010 1:49 PM

## 2010-12-10 NOTE — Letter (Signed)
Summary: [facility]: Immunoassay Fecal Occult Blood (iFOB) Order Form  [facility] [place]  [address] [place], [place] [address]   Phone: [phone]  Fax: [phone]      [facility]: Immunoassay Fecal Occult Blood (iFOB) Order Form   March 03, 2010 MRN: [id number]   Natasha Trujillo [birth year]   Physicican Name:___Yvonne Lowne,DO______________________  Diagnosis Code:______v76.51____________________      Army Fossa CMA

## 2010-12-10 NOTE — Consult Note (Signed)
Summary: [facility]  [facility]   Imported By: Lanelle Bal 01/19/2010 09:31:45  _____________________________________________________________________  External Attachment:    Type:   Image     Comment:   External Document

## 2010-12-10 NOTE — Medication Information (Signed)
Summary: Noncompliance with Triamterene/[employer]  Noncompliance with Triamterene/[employer]   Imported By: Lanelle Bal 02/02/2010 10:02:20  _____________________________________________________________________  External Attachment:    Type:   Image     Comment:   External Document

## 2010-12-10 NOTE — Progress Notes (Signed)
Summary: [name]  Phone Note Outgoing Call   Summary of Call: Triage Call Report Triage Record Num: [id number] Operator: Dayton Martes Patient Name: Natasha Trujillo Call Date & Time: 01/10/2010 7:18:03PM Patient Phone: [phone] PCP: Lelon Perla Patient Gender: Female PCP Fax : Patient DOB: [birth year] Practice Name: [facility] Reason for Call: Pt sts that she has had nasal congestion since 01/08/10 and earache; onset tonight. Denies urgent emergent sx's. Care adv given. PER STANDING ORDERS -A/B Otic: instill 2-3 gtts affected ear Q 2-3 hrs prn pain. [facility] @ [phone]. Adv UC in am. Protocol(s) Used: Ear - Pain/Injury/Foreign Body Recommended Outcome per Protocol: See Provider within 24 hours Reason for Outcome: Constant or intermittent dull earache, throbbing in ear(s) or feeling of fullness; may interfere with sleep or ability to carry out normal activities Care Advice: A warm washcloth or heating pad set on low to the affected ear may help relieve the discomfort. May apply for 15 to 20 minutes, 3 to 4 times a day.  ~ Speak with provider as soon as possible if having: - discharge from ear that does not look like earwax or that has bad odor - increased redness or swelling of external ear - worsening pain - new or worsening dizziness - or unsteadiness.  ~ Resting or sleeping with head elevated, such as semi-reclining in a recliner, may help reduce inner ear pressure and discomfort.  ~ Keep ear canal as dry as possible. Take a bath instead of showering. Try to keep water out of ear when washing hair by placing cotton balls in ear opening. Avoid swimming or water sports until okayed by provider.  ~ Consider acetaminophen as directed on label or by pharmacist/provider for pain or fever PRECAUTIONS: - If there is no history of liver disease, alcoholism, or intake of three or more alcohol drinks per day - If approved by provider during pregnancy  or when breastfeeding - During pregnancy, acetaminophen should not be taken more than 3 consecutive days without telling provider   Follow-up for Phone Call        pt states that ear drum burst after speaking with nurse. pt went to ED to be evaluated and is to f/u with ENT this week. pt states that she is doing much better today. .....Marland KitchenMarland KitchenFelecia Deloach CMA  January 12, 2010 8:36 AM

## 2010-12-10 NOTE — Assessment & Plan Note (Signed)
Summary: FLU SHOT///SPH   Nurse Visit   Allergies: No Known Drug Allergies  Orders Added: 1)  Admin 1st Vaccine [90471] 2)  Flu Vaccine 56yrs + [04540] Flu Vaccine Consent Questions     Do you have a history of severe allergic reactions to this vaccine? no    Any prior history of allergic reactions to egg and/or gelatin? no    Do you have a sensitivity to the preservative Thimersol? no    Do you have a past history of Guillan-Barre Syndrome? no    Do you currently have an acute febrile illness? no    Have you ever had a severe reaction to latex? no    Vaccine information given and explained to patient? yes    Are you currently pregnant? no    Lot Number:[id number]   Exp Date:05/08/2011   Site Given Right Deltoid IMu Vaccine 32yrs + [id number] .lbflu

## 2010-12-10 NOTE — Progress Notes (Signed)
Summary: Refill Request  Phone Note Refill Request Message from:  [facility] [address]   Refills Requested: Medication #1:  LIPITOR 20 MG  TABS 1 by mouth at bedtime   Dosage confirmed as above?Dosage Confirmed Patient normally orders her meds thru [facility], and has already ordered her Lipitor but it hasnt come yet and she is leaving early in the morning for 9 days. She wanted to know if you could call in like a 2 week rx and she can pick it up before she leaves. Thanks!!  Initial call taken by: Harold Barban,  January 23, 2010 1:43 PM  Follow-up for Phone Call        sent in meds, pt aware. Army Fossa CMA  January 23, 2010 1:49 PM     Prescriptions: LIPITOR 20 MG  TABS (ATORVASTATIN CALCIUM) 1 by mouth at bedtime  #30 x 0   Entered by:   Army Fossa CMA   Authorized by:   Loreen Freud DO   Signed by:   Army Fossa CMA on 01/23/2010   Method used:   [id number]       [address]       [place], [place]  [address]       Ph: [phone] or [phone]       Fax: [phone]   RxID:   [id number]

## 2010-12-10 NOTE — Letter (Signed)
Summary: Letter Regarding Liver Function Test/[employer]  Letter Regarding Liver Function Test/[employer]   Imported By: Lanelle Bal 03/26/2010 14:23:57  _____________________________________________________________________  External Attachment:    Type:   Image     Comment:   External Document  Appended Document: Letter Regarding Liver Function Test/[employer] It was just done 02/2010

## 2010-12-10 NOTE — Progress Notes (Signed)
Summary: refill  Phone Note Refill Request Message from:  Fax from [facility] fax [phone]  Refills Requested: Medication #1:  SOMA 250 MG  TABS 1 by mouth at bedtime as needed bupropion hcl xl 150mg ,  Initial call taken by: Barb Merino,  November 13, 2009 10:24 AM  Follow-up for Phone Call        soma last refilled- 02/28/09 bupropion- 10/20/09 last ov- 10/09/09- acute last cpx- 02/28/09 Follow-up by: Army Fossa CMA,  November 13, 2009 10:46 AM  Additional Follow-up for Phone Call Additional follow up Details #1::        ok to refill x1 Additional Follow-up by: Loreen Freud DO,  November 13, 2009 11:22 AM    Prescriptions: SOMA 250 MG  TABS (CARISOPRODOL) 1 by mouth at bedtime as needed  #90 x 0   Entered by:   Army Fossa CMA   Authorized by:   Loreen Freud DO   Signed by:   Army Fossa CMA on 11/13/2009   Method used:   Printed then faxed to ...       [facility]* (mail-order)             ,          Ph: [phone]       Fax: [phone]   RxID:   [id number] WELLBUTRIN XL 150 MG XR24H-TAB (BUPROPION HCL) 1 by mouth qam.  #90 x 0   Entered by:   Army Fossa CMA   Authorized by:   Loreen Freud DO   Signed by:   Army Fossa CMA on 11/13/2009   Method used:   Faxed to ...       [facility]* (mail-order)             ,          Ph: [phone]       Fax: [phone]   RxID:   [id number]

## 2011-01-01 ENCOUNTER — Telehealth (INDEPENDENT_AMBULATORY_CARE_PROVIDER_SITE_OTHER): Payer: Self-pay | Admitting: *Deleted

## 2011-01-04 ENCOUNTER — Telehealth (INDEPENDENT_AMBULATORY_CARE_PROVIDER_SITE_OTHER): Payer: Self-pay | Admitting: *Deleted

## 2011-01-05 ENCOUNTER — Telehealth (INDEPENDENT_AMBULATORY_CARE_PROVIDER_SITE_OTHER): Payer: Self-pay | Admitting: *Deleted

## 2011-01-05 NOTE — Progress Notes (Signed)
Summary: refill  Phone Note Refill Request Message from:  Patient  Refills Requested: Medication #1:  LIPITOR 20 MG  TABS 1 by mouth at bedtime ** LABS ARE DUE NOW*** Pt requesting 90 day supply. Pt would like to pick rx up.Marland Kitchen Pt aware Rx ready for pickup............Marland KitchenFelecia Deloach CMA  January 01, 2011 11:28 AM      Prescriptions: LIPITOR 20 MG  TABS (ATORVASTATIN CALCIUM) 1 by mouth at bedtime ** LABS ARE DUE NOW***  #90 x 0   Entered by:   Jeremy Johann CMA   Authorized by:   Loreen Freud DO   Signed by:   Jeremy Johann CMA on 01/01/2011   Method used:   Print then Give to Patient   RxID:   [id number]

## 2011-01-14 NOTE — Progress Notes (Signed)
Summary: refill  Phone Note Refill Request Message from:  Fax from Pharmacy on January 05, 2011 11:35 AM  Refills Requested: Medication #1:  LISINOPRIL-HYDROCHLOROTHIAZIDE 20-25 MG TABS take one tablet daily [facility] [address] - fax [phone]  Initial call taken by: Okey Regal Spring,  January 05, 2011 11:36 AM    Prescriptions: LISINOPRIL-HYDROCHLOROTHIAZIDE 20-25 MG TABS (LISINOPRIL-HYDROCHLOROTHIAZIDE) take one tablet daily  #90 Each x 0   Entered by:   Almeta Monas CMA (AAMA)   Authorized by:   Loreen Freud DO   Signed by:   Almeta Monas CMA (AAMA) on 01/05/2011   Method used:   Faxed to ...       [facility]* (retail)       [address] [place], [place]  [address]       Ph: [phone]       Fax: [phone]   RxID:   [id number]

## 2011-01-14 NOTE — Progress Notes (Signed)
Summary: refiil  Phone Note Refill Request Message from:  Fax from Pharmacy on January 04, 2011 8:55 AM  Refills Requested: Medication #1:  LIPITOR 20 MG  TABS 1 by mouth at bedtime ** LABS ARE DUE NOW*** [facility] [address] [facility] [phone] - 90 day supply  Initial call taken by: Okey Regal Spring,  January 04, 2011 8:57 AM    New/Updated Medications: LIPITOR 20 MG  TABS (ATORVASTATIN CALCIUM) 1 by mouth at bedtime Prescriptions: LIPITOR 20 MG  TABS (ATORVASTATIN CALCIUM) 1 by mouth at bedtime  #90 x 0   Entered by:   Almeta Monas CMA (AAMA)   Authorized by:   Loreen Freud DO   Signed by:   Almeta Monas CMA (AAMA) on 01/04/2011   Method used:   Electronically to        [facility]* (retail)       [address] [place], [place]  [address]       Ph: [phone]       Fax: [phone]   RxID:   [id number]

## 2011-02-11 ENCOUNTER — Other Ambulatory Visit: Payer: Self-pay

## 2011-02-11 MED ORDER — BUPROPION HCL ER (XL) 150 MG PO TB24: 150.0000 mg | ORAL_TABLET | ORAL | Status: DC

## 2011-02-18 ENCOUNTER — Encounter: Payer: Self-pay | Admitting: Family Medicine

## 2011-02-22 ENCOUNTER — Other Ambulatory Visit: Payer: Self-pay | Admitting: Family Medicine

## 2011-03-08 ENCOUNTER — Ambulatory Visit (INDEPENDENT_AMBULATORY_CARE_PROVIDER_SITE_OTHER): Payer: 59 | Admitting: Family Medicine

## 2011-03-08 ENCOUNTER — Encounter: Payer: Self-pay | Admitting: Family Medicine

## 2011-03-08 VITALS — BP 118/72 | HR 60 | Resp 12 | Ht 62.0 in | Wt 122.8 lb

## 2011-03-08 DIAGNOSIS — M899 Disorder of bone, unspecified: Secondary | ICD-10-CM

## 2011-03-08 DIAGNOSIS — E785 Hyperlipidemia, unspecified: Secondary | ICD-10-CM

## 2011-03-08 DIAGNOSIS — E7800 Pure hypercholesterolemia, unspecified: Secondary | ICD-10-CM

## 2011-03-08 DIAGNOSIS — I10 Essential (primary) hypertension: Secondary | ICD-10-CM

## 2011-03-08 DIAGNOSIS — Z0000 Encounter for general adult medical examination without abnormal findings: Secondary | ICD-10-CM

## 2011-03-08 DIAGNOSIS — N951 Menopausal and female climacteric states: Secondary | ICD-10-CM

## 2011-03-08 DIAGNOSIS — M8580 Other specified disorders of bone density and structure, unspecified site: Secondary | ICD-10-CM

## 2011-03-08 DIAGNOSIS — M949 Disorder of cartilage, unspecified: Secondary | ICD-10-CM

## 2011-03-08 LAB — CBC WITH DIFFERENTIAL/PLATELET
Basophils Relative: 0.5 % (ref 0.0–3.0)
Eosinophils Relative: 6.4 % — ABNORMAL HIGH (ref 0.0–5.0)
HCT: 35 % — ABNORMAL LOW (ref 36.0–46.0)
Lymphs Abs: 1.3 10*3/uL (ref 0.7–4.0)
MCV: 97 fl (ref 78.0–100.0)
Monocytes Absolute: 0.5 10*3/uL (ref 0.1–1.0)
RBC: 3.6 Mil/uL — ABNORMAL LOW (ref 3.87–5.11)
WBC: 4.5 10*3/uL (ref 4.5–10.5)

## 2011-03-08 LAB — POCT URINALYSIS DIPSTICK
Bilirubin, UA: NEGATIVE
Clarity, UA: NEGATIVE
Glucose, UA: NEGATIVE
Ketones, UA: NEGATIVE
Spec Grav, UA: 1.005

## 2011-03-08 NOTE — Assessment & Plan Note (Signed)
Per gyn 

## 2011-03-08 NOTE — Progress Notes (Signed)
  Subjective:     Natasha Trujillo is a 65 y.o. female and is here for a comprehensive physical exam. The patient reports seeing Dr Vincente Poli for menopause.Marland Kitchen  History   Social History  . Marital Status: Married    Spouse Name: N/A    Number of Children: N/A  . Years of Education: N/A   Occupational History  . retired    Social History Main Topics  . Smoking status: Never Smoker   . Smokeless tobacco: Never Used  . Alcohol Use: Yes  . Drug Use: No  . Sexually Active: Yes -- Female partner(s)   Other Topics Concern  . Not on file   Social History Narrative  . No narrative on file   Health Maintenance  Topic Date Due  . Influenza Vaccine  08/09/2011  . Pap Smear  02/20/2012  . Mammogram  03/02/2012  . Colonoscopy  12/03/2015  . Tetanus/tdap  07/26/2018  . Zostavax  Completed    The following portions of the patient's history were reviewed and updated as appropriate: allergies, current medications, past family history, past medical history, past social history, past surgical history and problem list.  Review of Systems A comprehensive review of systems was negative except for: Gastrointestinal: positive for increased gas   Objective:    BP 118/72  Pulse 60  Resp 12  Ht 5\' 2"  (1.575 m)  Wt 122 lb 12.8 oz (55.702 kg)  BMI 22.46 kg/m2 General appearance: alert, cooperative, appears stated age and no distress Head: Normocephalic, without obvious abnormality, atraumatic Eyes: conjunctivae/corneas clear. PERRL, EOM's intact. Fundi benign. Ears: normal TM's and external ear canals both ears Nose: Nares normal. Septum midline. Mucosa normal. No drainage or sinus tenderness. Throat: lips, mucosa, and tongue normal; teeth and gums normal Neck: no adenopathy, no carotid bruit, no JVD, supple, symmetrical, trachea midline and thyroid not enlarged, symmetric, no tenderness/mass/nodules Lungs: clear to auscultation bilaterally Breasts: gyn Heart: regular rate and rhythm, S1, S2  normal, no murmur, click, rub or gallop Abdomen: soft, non-tender; bowel sounds normal; no masses,  no organomegaly Pelvic: gyn.  Rectal per gyn Extremities: extremities normal, atraumatic, no cyanosis or edema Pulses: 2+ and symmetric Skin: Skin color, texture, turgor normal. No rashes or lesions Lymph nodes: Cervical, supraclavicular, and axillary nodes normal. Neurologic: Grossly normal    Assessment:    Healthy female exam.    Plan:     See After Visit Summary for Counseling Recommendations

## 2011-03-08 NOTE — Progress Notes (Signed)
Addended by: Floydene Flock on: 03/08/2011 01:33 PM   Modules accepted: Orders

## 2011-03-08 NOTE — Patient Instructions (Signed)
con't diet and exercise rto 6 months

## 2011-03-08 NOTE — Assessment & Plan Note (Signed)
Cont meds Recheck lab

## 2011-03-08 NOTE — Assessment & Plan Note (Signed)
Stable Check labs  con't meds 

## 2011-03-23 ENCOUNTER — Ambulatory Visit (INDEPENDENT_AMBULATORY_CARE_PROVIDER_SITE_OTHER): Payer: 59 | Admitting: Family Medicine

## 2011-03-23 ENCOUNTER — Encounter: Payer: Self-pay | Admitting: Family Medicine

## 2011-03-23 DIAGNOSIS — E785 Hyperlipidemia, unspecified: Secondary | ICD-10-CM

## 2011-03-23 DIAGNOSIS — E559 Vitamin D deficiency, unspecified: Secondary | ICD-10-CM

## 2011-03-23 MED ORDER — NIACIN ER (ANTIHYPERLIPIDEMIC) 1000 MG PO TBCR: 1000.0000 mg | EXTENDED_RELEASE_TABLET | Freq: Every day | ORAL | Status: DC

## 2011-03-23 MED ORDER — LORATADINE 10 MG PO CAPS: 10.0000 mg | ORAL_CAPSULE | Freq: Every day | ORAL | Status: DC | PRN

## 2011-03-23 NOTE — Patient Instructions (Signed)
niaspan 500 mg every night for 1 month then increase to 1000mg  every night. Recheck labs in 3 months

## 2011-03-23 NOTE — Assessment & Plan Note (Signed)
Add niaspan 500mg  qpm for 1 month then increase to 1000 con't Lipitor Check labs  3months

## 2011-03-23 NOTE — Progress Notes (Signed)
  Subjective:    Patient ID: Natasha Trujillo, female    DOB: February 16, 1946, 65 y.o.   MRN: [id number]  HPI Pt here to go over [facility] and have vita D drawn.  No complaints.     Review of Systems As above    Objective:   Physical Exam  Constitutional: She appears well-developed and well-nourished.  Psychiatric: She has a normal mood and affect. Her behavior is normal.          Assessment & Plan:

## 2011-03-25 LAB — VITAMIN D 1,25 DIHYDROXY
Vitamin D 1, 25 (OH)2 Total: 73 pg/mL — ABNORMAL HIGH (ref 18–72)
Vitamin D3 1, 25 (OH)2: 73 pg/mL

## 2011-03-26 ENCOUNTER — Encounter: Payer: Self-pay | Admitting: *Deleted

## 2011-03-29 ENCOUNTER — Other Ambulatory Visit: Payer: 59

## 2011-03-30 ENCOUNTER — Encounter: Payer: Self-pay | Admitting: Family Medicine

## 2011-04-02 ENCOUNTER — Other Ambulatory Visit: Payer: Self-pay

## 2011-04-02 MED ORDER — ATORVASTATIN CALCIUM 20 MG PO TABS: 20.0000 mg | ORAL_TABLET | Freq: Every day | ORAL | Status: DC

## 2011-04-02 MED ORDER — LISINOPRIL-HYDROCHLOROTHIAZIDE 20-25 MG PO TABS: 1.0000 | ORAL_TABLET | Freq: Every day | ORAL | Status: DC

## 2011-04-16 ENCOUNTER — Telehealth: Payer: Self-pay | Admitting: Family Medicine

## 2011-04-16 NOTE — Telephone Encounter (Signed)
Yes ok to give samples.

## 2011-04-16 NOTE — Telephone Encounter (Signed)
Left Pt detail message samples placed up front for pick up. 

## 2011-04-16 NOTE — Telephone Encounter (Signed)
Please advise 

## 2011-05-15 ENCOUNTER — Other Ambulatory Visit: Payer: Self-pay | Admitting: Family Medicine

## 2011-05-17 ENCOUNTER — Other Ambulatory Visit: Payer: Self-pay | Admitting: Family Medicine

## 2011-05-17 ENCOUNTER — Telehealth: Payer: Self-pay | Admitting: Family Medicine

## 2011-05-17 MED ORDER — BUPROPION HCL ER (XL) 150 MG PO TB24: 150.0000 mg | ORAL_TABLET | ORAL | Status: DC

## 2011-05-17 NOTE — Telephone Encounter (Signed)
Last seen 03/21/11 and filled 02/11/11 # 90 please advise    KP

## 2011-05-17 NOTE — Telephone Encounter (Signed)
Tried contacting patient to verify meds and mail order comp... No ans

## 2011-05-17 NOTE — Telephone Encounter (Signed)
Patient wants to get med from mail order - doesn't want refilled at [facility]

## 2011-05-18 ENCOUNTER — Other Ambulatory Visit: Payer: Self-pay | Admitting: Family Medicine

## 2011-05-18 DIAGNOSIS — F32A Depression, unspecified: Secondary | ICD-10-CM

## 2011-05-18 DIAGNOSIS — F329 Major depressive disorder, single episode, unspecified: Secondary | ICD-10-CM

## 2011-05-18 MED ORDER — BUPROPION HCL ER (XL) 150 MG PO TB24: 150.0000 mg | ORAL_TABLET | ORAL | Status: DC

## 2011-05-18 NOTE — Telephone Encounter (Signed)
rcv'd fax from mail order.Marland Kitchen Rx faxed    KP

## 2011-05-26 ENCOUNTER — Telehealth: Payer: Self-pay | Admitting: Family Medicine

## 2011-05-26 NOTE — Telephone Encounter (Signed)
Pt advise that we do not have samples of this med. Offered to send Rx in to local pharmacy, Pt states that she will have to think about it and give [place] a call back.

## 2011-06-23 ENCOUNTER — Other Ambulatory Visit: Payer: Self-pay | Admitting: *Deleted

## 2011-06-23 ENCOUNTER — Other Ambulatory Visit: Payer: Self-pay | Admitting: Family Medicine

## 2011-06-23 DIAGNOSIS — E785 Hyperlipidemia, unspecified: Secondary | ICD-10-CM

## 2011-06-24 ENCOUNTER — Other Ambulatory Visit (INDEPENDENT_AMBULATORY_CARE_PROVIDER_SITE_OTHER): Payer: 59

## 2011-06-24 DIAGNOSIS — E785 Hyperlipidemia, unspecified: Secondary | ICD-10-CM

## 2011-06-24 DIAGNOSIS — Z0000 Encounter for general adult medical examination without abnormal findings: Secondary | ICD-10-CM

## 2011-06-24 LAB — LIPID PANEL
Cholesterol: 159 mg/dL (ref 0–200)
HDL: 80.5 mg/dL (ref >39.00)
Triglycerides: 77 mg/dL (ref 0.0–149.0)

## 2011-06-24 LAB — HEPATIC FUNCTION PANEL
Bilirubin, Direct: 0 mg/dL (ref 0.0–0.3)
Total Bilirubin: 0.3 mg/dL (ref 0.3–1.2)
Total Protein: 6.7 g/dL (ref 6.0–8.3)

## 2011-06-24 NOTE — Progress Notes (Signed)
Labs only

## 2011-06-25 ENCOUNTER — Encounter: Payer: Self-pay | Admitting: Family Medicine

## 2011-06-28 ENCOUNTER — Other Ambulatory Visit: Payer: Self-pay | Admitting: Family Medicine

## 2011-07-12 ENCOUNTER — Other Ambulatory Visit: Payer: Self-pay | Admitting: Family Medicine

## 2011-08-17 LAB — DIFFERENTIAL
Basophils Absolute: 0
Eosinophils Relative: 5
Lymphocytes Relative: 27
Lymphs Abs: 1.7
Monocytes Relative: 7
Neutro Abs: 3.8

## 2011-08-17 LAB — SEDIMENTATION RATE: Sed Rate: 5

## 2011-08-17 LAB — I-STAT 8, (EC8 V) (CONVERTED LAB)
Glucose, Bld: 114 — ABNORMAL HIGH
Potassium: 5.9 — ABNORMAL HIGH
TCO2: 33
pH, Ven: 7.407 — ABNORMAL HIGH

## 2011-08-17 LAB — CBC
HCT: 38
Hemoglobin: 12.8
RDW: 13.6
WBC: 6.2

## 2011-08-17 LAB — POCT CARDIAC MARKERS
CKMB, poc: 1.6
Troponin i, poc: <0.05

## 2011-08-17 LAB — POCT I-STAT CREATININE: Operator id: 272551

## 2011-12-21 ENCOUNTER — Telehealth: Payer: Self-pay | Admitting: Family Medicine

## 2011-12-21 MED ORDER — PROMETHAZINE HCL 25 MG PO TABS: 25.0000 mg | ORAL_TABLET | Freq: Three times a day (TID) | ORAL | Status: AC | PRN

## 2011-12-21 NOTE — Telephone Encounter (Signed)
Patient called again requesting advice from dr/

## 2011-12-21 NOTE — Telephone Encounter (Signed)
Discussed recommendations with patient and she voice understanding-- she agree to try Gatorade, clear liquid and phenergan was ok'd by Dr.Lowne.    Rx faxed     KP

## 2011-12-21 NOTE — Telephone Encounter (Signed)
Vomiting not usually associated with flu---- usually high fever and body aches--- could be rotavirus---completely different treatment.  Is she have diarrhea too.

## 2011-12-21 NOTE — Telephone Encounter (Signed)
Patient's husband called stating patient has been vomiting since last night. Patient does not feel well enough to come in. Requesting prescription for tamiflu. [facility] [address]

## 2012-01-08 ENCOUNTER — Other Ambulatory Visit: Payer: Self-pay | Admitting: Family Medicine

## 2012-03-08 LAB — HM DEXA SCAN

## 2012-03-08 LAB — HM MAMMOGRAPHY

## 2012-03-21 ENCOUNTER — Other Ambulatory Visit: Payer: Self-pay | Admitting: Obstetrics and Gynecology

## 2012-03-29 ENCOUNTER — Other Ambulatory Visit: Payer: Self-pay | Admitting: Family Medicine

## 2012-04-17 ENCOUNTER — Other Ambulatory Visit: Payer: Self-pay | Admitting: Family Medicine

## 2012-05-08 ENCOUNTER — Other Ambulatory Visit: Payer: Self-pay | Admitting: Family Medicine

## 2012-05-08 ENCOUNTER — Telehealth: Payer: Self-pay | Admitting: Family Medicine

## 2012-05-08 DIAGNOSIS — Z0000 Encounter for general adult medical examination without abnormal findings: Secondary | ICD-10-CM

## 2012-05-08 NOTE — Telephone Encounter (Signed)
Pt coming in for CPE tomorrow and would like to come at 9am for labs. Can you put orders in for her?

## 2012-05-08 NOTE — Telephone Encounter (Signed)
Please advise on labs.      KP 

## 2012-05-09 ENCOUNTER — Ambulatory Visit (INDEPENDENT_AMBULATORY_CARE_PROVIDER_SITE_OTHER): Payer: 59 | Admitting: Family Medicine

## 2012-05-09 ENCOUNTER — Encounter: Payer: Self-pay | Admitting: Family Medicine

## 2012-05-09 VITALS — BP 114/70 | HR 76 | Temp 97.9°F | Ht 62.0 in | Wt 125.0 lb

## 2012-05-09 DIAGNOSIS — E785 Hyperlipidemia, unspecified: Secondary | ICD-10-CM

## 2012-05-09 DIAGNOSIS — I10 Essential (primary) hypertension: Secondary | ICD-10-CM

## 2012-05-09 DIAGNOSIS — M8580 Other specified disorders of bone density and structure, unspecified site: Secondary | ICD-10-CM

## 2012-05-09 DIAGNOSIS — E7800 Pure hypercholesterolemia, unspecified: Secondary | ICD-10-CM

## 2012-05-09 DIAGNOSIS — Z0000 Encounter for general adult medical examination without abnormal findings: Secondary | ICD-10-CM

## 2012-05-09 LAB — HEPATIC FUNCTION PANEL
Alkaline Phosphatase: 39 U/L (ref 39–117)
Bilirubin, Direct: 0 mg/dL (ref 0.0–0.3)
Total Bilirubin: 0.6 mg/dL (ref 0.3–1.2)
Total Protein: 6.9 g/dL (ref 6.0–8.3)

## 2012-05-09 LAB — POCT URINALYSIS DIPSTICK
Leukocytes, UA: NEGATIVE
Protein, UA: NEGATIVE
Spec Grav, UA: <=1.005
Urobilinogen, UA: 0.2
pH, UA: 8

## 2012-05-09 LAB — CBC WITH DIFFERENTIAL/PLATELET
Eosinophils Absolute: 0.3 10*3/uL (ref 0.0–0.7)
HCT: 36.8 % (ref 36.0–46.0)
Lymphs Abs: 1.3 10*3/uL (ref 0.7–4.0)
MCHC: 33.5 g/dL (ref 30.0–36.0)
MCV: 96.2 fl (ref 78.0–100.0)
Monocytes Absolute: 0.5 10*3/uL (ref 0.1–1.0)
Neutrophils Relative %: 46.8 % (ref 43.0–77.0)
Platelets: 247 10*3/uL (ref 150.0–400.0)
RDW: 14.4 % (ref 11.5–14.6)

## 2012-05-09 LAB — BASIC METABOLIC PANEL
BUN: 14 mg/dL (ref 6–23)
CO2: 28 mEq/L (ref 19–32)
Chloride: 99 mEq/L (ref 96–112)
Creatinine, Ser: 0.7 mg/dL (ref 0.4–1.2)
Glucose, Bld: 93 mg/dL (ref 70–99)

## 2012-05-09 LAB — LIPID PANEL
Cholesterol: 169 mg/dL (ref 0–200)
LDL Cholesterol: 82 mg/dL (ref 0–99)
Total CHOL/HDL Ratio: 2

## 2012-05-09 NOTE — Addendum Note (Signed)
Addended by: Silvio Pate D on: 05/09/2012 02:31 PM   Modules accepted: Orders

## 2012-05-09 NOTE — Patient Instructions (Addendum)
Preventive Care for Adults, Female A healthy lifestyle and preventive care can promote health and wellness. Preventive health guidelines for women include the following key practices.  A routine yearly physical is a good way to check with your caregiver about your health and preventive screening. It is a chance to share any concerns and updates on your health, and to receive a thorough exam.   Visit your dentist for a routine exam and preventive care every 6 months. Brush your teeth twice a day and floss once a day. Good oral hygiene prevents tooth decay and gum disease.   The frequency of eye exams is based on your age, health, family medical history, use of contact lenses, and other factors. Follow your caregiver's recommendations for frequency of eye exams.   Eat a healthy diet. Foods like vegetables, fruits, whole grains, low-fat dairy products, and lean protein foods contain the nutrients you need without too many calories. Decrease your intake of foods high in solid fats, added sugars, and salt. Eat the right amount of calories for you.Get information about a proper diet from your caregiver, if necessary.   Regular physical exercise is one of the most important things you can do for your health. Most adults should get at least 150 minutes of moderate-intensity exercise (any activity that increases your heart rate and causes you to sweat) each week. In addition, most adults need muscle-strengthening exercises on 2 or more days a week.   Maintain a healthy weight. The body mass index (BMI) is a screening tool to identify possible weight problems. It provides an estimate of body fat based on height and weight. Your caregiver can help determine your BMI, and can help you achieve or maintain a healthy weight.For adults 20 years and older:   A BMI below 18.5 is considered underweight.   A BMI of 18.5 to 24.9 is normal.   A BMI of 25 to 29.9 is considered overweight.   A BMI of 30 and above is  considered obese.   Maintain normal blood lipids and cholesterol levels by exercising and minimizing your intake of saturated fat. Eat a balanced diet with plenty of fruit and vegetables. Blood tests for lipids and cholesterol should begin at age 20 and be repeated every 5 years. If your lipid or cholesterol levels are high, you are over 50, or you are at high risk for heart disease, you may need your cholesterol levels checked more frequently.Ongoing high lipid and cholesterol levels should be treated with medicines if diet and exercise are not effective.   If you smoke, find out from your caregiver how to quit. If you do not use tobacco, do not start.   If you are pregnant, do not drink alcohol. If you are breastfeeding, be very cautious about drinking alcohol. If you are not pregnant and choose to drink alcohol, do not exceed 1 drink per day. One drink is considered to be 12 ounces (355 mL) of beer, 5 ounces (148 mL) of wine, or 1.5 ounces (44 mL) of liquor.   Avoid use of street drugs. Do not share needles with anyone. Ask for help if you need support or instructions about stopping the use of drugs.   High blood pressure causes heart disease and increases the risk of stroke. Your blood pressure should be checked at least every 1 to 2 years. Ongoing high blood pressure should be treated with medicines if weight loss and exercise are not effective.   If you are 55 to 65   years old, ask your caregiver if you should take aspirin to prevent strokes.   Diabetes screening involves taking a blood sample to check your fasting blood sugar level. This should be done once every 3 years, after age 45, if you are within normal weight and without risk factors for diabetes. Testing should be considered at a younger age or be carried out more frequently if you are overweight and have at least 1 risk factor for diabetes.   Breast cancer screening is essential preventive care for women. You should practice "breast  self-awareness." This means understanding the normal appearance and feel of your breasts and may include breast self-examination. Any changes detected, no matter how small, should be reported to a caregiver. Women in their 20s and 30s should have a clinical breast exam (CBE) by a caregiver as part of a regular health exam every 1 to 3 years. After age 40, women should have a CBE every year. Starting at age 40, women should consider having a mammography (breast X-ray test) every year. Women who have a family history of breast cancer should talk to their caregiver about genetic screening. Women at a high risk of breast cancer should talk to their caregivers about having magnetic resonance imaging (MRI) and a mammography every year.   The Pap test is a screening test for cervical cancer. A Pap test can show cell changes on the cervix that might become cervical cancer if left untreated. A Pap test is a procedure in which cells are obtained and examined from the lower end of the uterus (cervix).   Women should have a Pap test starting at age 21.   Between ages 21 and 29, Pap tests should be repeated every 2 years.   Beginning at age 30, you should have a Pap test every 3 years as long as the past 3 Pap tests have been normal.   Some women have medical problems that increase the chance of getting cervical cancer. Talk to your caregiver about these problems. It is especially important to talk to your caregiver if a new problem develops soon after your last Pap test. In these cases, your caregiver may recommend more frequent screening and Pap tests.   The above recommendations are the same for women who have or have not gotten the vaccine for human papillomavirus (HPV).   If you had a hysterectomy for a problem that was not cancer or a condition that could lead to cancer, then you no longer need Pap tests. Even if you no longer need a Pap test, a regular exam is a good idea to make sure no other problems are  starting.   If you are between ages 65 and 70, and you have had normal Pap tests going back 10 years, you no longer need Pap tests. Even if you no longer need a Pap test, a regular exam is a good idea to make sure no other problems are starting.   If you have had past treatment for cervical cancer or a condition that could lead to cancer, you need Pap tests and screening for cancer for at least 20 years after your treatment.   If Pap tests have been discontinued, risk factors (such as a new sexual partner) need to be reassessed to determine if screening should be resumed.   The HPV test is an additional test that may be used for cervical cancer screening. The HPV test looks for the virus that can cause the cell changes on the cervix.   The cells collected during the Pap test can be tested for HPV. The HPV test could be used to screen women aged 30 years and older, and should be used in women of any age who have unclear Pap test results. After the age of 30, women should have HPV testing at the same frequency as a Pap test.   Colorectal cancer can be detected and often prevented. Most routine colorectal cancer screening begins at the age of 50 and continues through age 75. However, your caregiver may recommend screening at an earlier age if you have risk factors for colon cancer. On a yearly basis, your caregiver may provide home test kits to check for hidden blood in the stool. Use of a small camera at the end of a tube, to directly examine the colon (sigmoidoscopy or colonoscopy), can detect the earliest forms of colorectal cancer. Talk to your caregiver about this at age 50, when routine screening begins. Direct examination of the colon should be repeated every 5 to 10 years through age 75, unless early forms of pre-cancerous polyps or small growths are found.   Hepatitis C blood testing is recommended for all people born from 1945 through 1965 and any individual with known risks for hepatitis C.    Practice safe sex. Use condoms and avoid high-risk sexual practices to reduce the spread of sexually transmitted infections (STIs). STIs include gonorrhea, chlamydia, syphilis, trichomonas, herpes, HPV, and human immunodeficiency virus (HIV). Herpes, HIV, and HPV are viral illnesses that have no cure. They can result in disability, cancer, and death. Sexually active women aged 25 and younger should be checked for chlamydia. Older women with new or multiple partners should also be tested for chlamydia. Testing for other STIs is recommended if you are sexually active and at increased risk.   Osteoporosis is a disease in which the bones lose minerals and strength with aging. This can result in serious bone fractures. The risk of osteoporosis can be identified using a bone density scan. Women ages 65 and over and women at risk for fractures or osteoporosis should discuss screening with their caregivers. Ask your caregiver whether you should take a calcium supplement or vitamin D to reduce the rate of osteoporosis.   Menopause can be associated with physical symptoms and risks. Hormone replacement therapy is available to decrease symptoms and risks. You should talk to your caregiver about whether hormone replacement therapy is right for you.   Use sunscreen with sun protection factor (SPF) of 30 or more. Apply sunscreen liberally and repeatedly throughout the day. You should seek shade when your shadow is shorter than you. Protect yourself by wearing long sleeves, pants, a wide-brimmed hat, and sunglasses year round, whenever you are outdoors.   Once a month, do a whole body skin exam, using a mirror to look at the skin on your back. Notify your caregiver of new moles, moles that have irregular borders, moles that are larger than a pencil eraser, or moles that have changed in shape or color.   Stay current with required immunizations.   Influenza. You need a dose every fall (or winter). The composition of  the flu vaccine changes each year, so being vaccinated once is not enough.   Pneumococcal polysaccharide. You need 1 to 2 doses if you smoke cigarettes or if you have certain chronic medical conditions. You need 1 dose at age 65 (or older) if you have never been vaccinated.   Tetanus, diphtheria, pertussis (Tdap, Td). Get 1 dose of   Tdap vaccine if you are younger than age 65, are over 65 and have contact with an infant, are a healthcare worker, are pregnant, or simply want to be protected from whooping cough. After that, you need a Td booster dose every 10 years. Consult your caregiver if you have not had at least 3 tetanus and diphtheria-containing shots sometime in your life or have a deep or dirty wound.   HPV. You need this vaccine if you are a woman age 26 or younger. The vaccine is given in 3 doses over 6 months.   Measles, mumps, rubella (MMR). You need at least 1 dose of MMR if you were born in 1957 or later. You may also need a second dose.   Meningococcal. If you are age 19 to 21 and a first-year college student living in a residence hall, or have one of several medical conditions, you need to get vaccinated against meningococcal disease. You may also need additional booster doses.   Zoster (shingles). If you are age 60 or older, you should get this vaccine.   Varicella (chickenpox). If you have never had chickenpox or you were vaccinated but received only 1 dose, talk to your caregiver to find out if you need this vaccine.   Hepatitis A. You need this vaccine if you have a specific risk factor for hepatitis A virus infection or you simply wish to be protected from this disease. The vaccine is usually given as 2 doses, 6 to 18 months apart.   Hepatitis B. You need this vaccine if you have a specific risk factor for hepatitis B virus infection or you simply wish to be protected from this disease. The vaccine is given in 3 doses, usually over 6 months.  Preventive Services /  Frequency Ages 19 to 39  Blood pressure check.** / Every 1 to 2 years.   Lipid and cholesterol check.** / Every 5 years beginning at age 20.   Clinical breast exam.** / Every 3 years for women in their 20s and 30s.   Pap test.** / Every 2 years from ages 21 through 29. Every 3 years starting at age 30 through age 65 or 70 with a history of 3 consecutive normal Pap tests.   HPV screening.** / Every 3 years from ages 30 through ages 65 to 70 with a history of 3 consecutive normal Pap tests.   Hepatitis C blood test.** / For any individual with known risks for hepatitis C.   Skin self-exam. / Monthly.   Influenza immunization.** / Every year.   Pneumococcal polysaccharide immunization.** / 1 to 2 doses if you smoke cigarettes or if you have certain chronic medical conditions.   Tetanus, diphtheria, pertussis (Tdap, Td) immunization. / A one-time dose of Tdap vaccine. After that, you need a Td booster dose every 10 years.   HPV immunization. / 3 doses over 6 months, if you are 26 and younger.   Measles, mumps, rubella (MMR) immunization. / You need at least 1 dose of MMR if you were born in 1957 or later. You may also need a second dose.   Meningococcal immunization. / 1 dose if you are age 19 to 21 and a first-year college student living in a residence hall, or have one of several medical conditions, you need to get vaccinated against meningococcal disease. You may also need additional booster doses.   Varicella immunization.** / Consult your caregiver.   Hepatitis A immunization.** / Consult your caregiver. 2 doses, 6 to 18 months   apart.   Hepatitis B immunization.** / Consult your caregiver. 3 doses usually over 6 months.  Ages 40 to 64  Blood pressure check.** / Every 1 to 2 years.   Lipid and cholesterol check.** / Every 5 years beginning at age 20.   Clinical breast exam.** / Every year after age 40.   Mammogram.** / Every year beginning at age 40 and continuing for as  long as you are in good health. Consult with your caregiver.   Pap test.** / Every 3 years starting at age 30 through age 65 or 70 with a history of 3 consecutive normal Pap tests.   HPV screening.** / Every 3 years from ages 30 through ages 65 to 70 with a history of 3 consecutive normal Pap tests.   Fecal occult blood test (FOBT) of stool. / Every year beginning at age 50 and continuing until age 75. You may not need to do this test if you get a colonoscopy every 10 years.   Flexible sigmoidoscopy or colonoscopy.** / Every 5 years for a flexible sigmoidoscopy or every 10 years for a colonoscopy beginning at age 50 and continuing until age 75.   Hepatitis C blood test.** / For all people born from 1945 through 1965 and any individual with known risks for hepatitis C.   Skin self-exam. / Monthly.   Influenza immunization.** / Every year.   Pneumococcal polysaccharide immunization.** / 1 to 2 doses if you smoke cigarettes or if you have certain chronic medical conditions.   Tetanus, diphtheria, pertussis (Tdap, Td) immunization.** / A one-time dose of Tdap vaccine. After that, you need a Td booster dose every 10 years.   Measles, mumps, rubella (MMR) immunization. / You need at least 1 dose of MMR if you were born in 1957 or later. You may also need a second dose.   Varicella immunization.** / Consult your caregiver.   Meningococcal immunization.** / Consult your caregiver.   Hepatitis A immunization.** / Consult your caregiver. 2 doses, 6 to 18 months apart.   Hepatitis B immunization.** / Consult your caregiver. 3 doses, usually over 6 months.  Ages 65 and over  Blood pressure check.** / Every 1 to 2 years.   Lipid and cholesterol check.** / Every 5 years beginning at age 20.   Clinical breast exam.** / Every year after age 40.   Mammogram.** / Every year beginning at age 40 and continuing for as long as you are in good health. Consult with your caregiver.   Pap test.** /  Every 3 years starting at age 30 through age 65 or 70 with a 3 consecutive normal Pap tests. Testing can be stopped between 65 and 70 with 3 consecutive normal Pap tests and no abnormal Pap or HPV tests in the past 10 years.   HPV screening.** / Every 3 years from ages 30 through ages 65 or 70 with a history of 3 consecutive normal Pap tests. Testing can be stopped between 65 and 70 with 3 consecutive normal Pap tests and no abnormal Pap or HPV tests in the past 10 years.   Fecal occult blood test (FOBT) of stool. / Every year beginning at age 50 and continuing until age 75. You may not need to do this test if you get a colonoscopy every 10 years.   Flexible sigmoidoscopy or colonoscopy.** / Every 5 years for a flexible sigmoidoscopy or every 10 years for a colonoscopy beginning at age 50 and continuing until age 75.   Hepatitis   C blood test.** / For all people born from 1945 through 1965 and any individual with known risks for hepatitis C.   Osteoporosis screening.** / A one-time screening for women ages 65 and over and women at risk for fractures or osteoporosis.   Skin self-exam. / Monthly.   Influenza immunization.** / Every year.   Pneumococcal polysaccharide immunization.** / 1 dose at age 65 (or older) if you have never been vaccinated.   Tetanus, diphtheria, pertussis (Tdap, Td) immunization. / A one-time dose of Tdap vaccine if you are over 65 and have contact with an infant, are a healthcare worker, or simply want to be protected from whooping cough. After that, you need a Td booster dose every 10 years.   Varicella immunization.** / Consult your caregiver.   Meningococcal immunization.** / Consult your caregiver.   Hepatitis A immunization.** / Consult your caregiver. 2 doses, 6 to 18 months apart.   Hepatitis B immunization.** / Check with your caregiver. 3 doses, usually over 6 months.  ** Family history and personal history of risk and conditions may change your caregiver's  recommendations. Document Released: 12/21/2001 Document Revised: 10/14/2011 Document Reviewed: 03/22/2011 [employer] Patient Information 2012 [employer]. 

## 2012-05-09 NOTE — Progress Notes (Signed)
Subjective:    Patient ID: Natasha Trujillo, female    DOB: 1946/05/08, 66 y.o.   MRN: [id number]  HPI    Review of Systems     Objective:   Physical Exam        Assessment & Plan:   Subjective:     Natasha Trujillo is a 66 y.o. female and is here for a comprehensive physical exam. The patient reports no problems.  History   Social History  . Marital Status: Married    Spouse Name: N/A    Number of Children: N/A  . Years of Education: N/A   Occupational History  . retired    Social History Main Topics  . Smoking status: Never Smoker   . Smokeless tobacco: Never Used  . Alcohol Use: Yes  . Drug Use: No  . Sexually Active: Yes -- Female partner(s)   Other Topics Concern  . Not on file   Social History Narrative  . No narrative on file   Health Maintenance  Topic Date Due  . Mammogram  03/02/2012  . Influenza Vaccine  08/08/2012  . Pap Smear  03/22/2015  . Colonoscopy  12/03/2015  . Tetanus/tdap  07/26/2018  . Zostavax  Completed    The following portions of the patient's history were reviewed and updated as appropriate: allergies, current medications, past family history, past medical history, past social history, past surgical history and problem list.  Review of Systems Review of Systems  Constitutional: Negative for activity change, appetite change and fatigue.  HENT: Negative for hearing loss, congestion, tinnitus and ear discharge.  dentist q27m Eyes: Negative for visual disturbance (see optho q1y -- vision corrected to 20/20 with contacts).  Respiratory: Negative for cough, chest tightness and shortness of breath.   Cardiovascular: Negative for chest pain, palpitations and leg swelling.  Gastrointestinal: Negative for abdominal pain, diarrhea, constipation and abdominal distention.  Genitourinary: Negative for urgency, frequency, decreased urine volume and difficulty urinating.  Musculoskeletal: Negative for back pain, arthralgias and gait  problem.  Skin: Negative for color change, pallor and rash.  Neurological: Negative for dizziness, light-headedness, numbness and headaches.  Hematological: Negative for adenopathy. Does not bruise/bleed easily.  Psychiatric/Behavioral: Negative for suicidal ideas, confusion, sleep disturbance, self-injury, dysphoric mood, decreased concentration and agitation.       Objective:    BP 114/70  Pulse 76  Temp 97.9 F (36.6 C) (Oral)  Ht 5\' 2"  (1.575 m)  Wt 125 lb (56.7 kg)  BMI 22.86 kg/m2  SpO2 98% General appearance: alert, cooperative, appears stated age and no distress Head: Normocephalic, without obvious abnormality, atraumatic Eyes: conjunctivae/corneas clear. PERRL, EOM's intact. Fundi benign. Ears: normal TM's and external ear canals both ears Nose: Nares normal. Septum midline. Mucosa normal. No drainage or sinus tenderness. Throat: lips, mucosa, and tongue normal; teeth and gums normal Neck: no adenopathy, no carotid bruit, no JVD, supple, symmetrical, trachea midline and thyroid not enlarged, symmetric, no tenderness/mass/nodules Back: symmetric, no curvature. ROM normal. No CVA tenderness. Lungs: clear to auscultation bilaterally Breasts: gyn Heart: regular rate and rhythm, S1, S2 normal, no murmur, click, rub or gallop Abdomen: soft, non-tender; bowel sounds normal; no masses,  no organomegaly Pelvic: deferred Extremities: extremities normal, atraumatic, no cyanosis or edema Pulses: 2+ and symmetric Skin: Skin color, texture, turgor normal. No rashes or lesions Lymph nodes: Cervical, supraclavicular, and axillary nodes normal. Neurologic: Alert and oriented X 3, normal strength and tone. Normal symmetric reflexes. Normal coordination and gait psych-- no depression, anxiety  Assessment:    Healthy female exam.      Plan:  ghm utd Check labs--drawn   See After Visit Summary for Counseling Recommendations

## 2012-05-09 NOTE — Assessment & Plan Note (Signed)
con't meds  Check labs 

## 2012-05-09 NOTE — Assessment & Plan Note (Signed)
Stable con't meds 

## 2012-05-09 NOTE — Addendum Note (Signed)
Addended by: Silvio Pate D on: 05/09/2012 10:21 AM   Modules accepted: Orders

## 2012-05-11 ENCOUNTER — Encounter: Payer: Self-pay | Admitting: Family Medicine

## 2012-05-12 LAB — VITAMIN D 1,25 DIHYDROXY
Vitamin D 1, 25 (OH)2 Total: 51 pg/mL (ref 18–72)
Vitamin D3 1, 25 (OH)2: 51 pg/mL

## 2012-06-06 ENCOUNTER — Other Ambulatory Visit: Payer: Self-pay | Admitting: Family Medicine

## 2012-06-26 ENCOUNTER — Other Ambulatory Visit: Payer: Self-pay | Admitting: *Deleted

## 2012-06-26 MED ORDER — BUPROPION HCL ER (XL) 150 MG PO TB24: ORAL_TABLET | ORAL | Status: DC

## 2012-06-26 MED ORDER — ATORVASTATIN CALCIUM 40 MG PO TABS: ORAL_TABLET | ORAL | Status: DC

## 2012-06-26 MED ORDER — LISINOPRIL-HYDROCHLOROTHIAZIDE 20-25 MG PO TABS: ORAL_TABLET | ORAL | Status: DC

## 2012-06-26 MED ORDER — LORATADINE 10 MG PO TABS: ORAL_TABLET | ORAL | Status: DC

## 2012-06-26 NOTE — Telephone Encounter (Signed)
Pt aware Rx sent.  

## 2012-11-24 ENCOUNTER — Encounter: Payer: Self-pay | Admitting: Gastroenterology

## 2012-12-23 ENCOUNTER — Other Ambulatory Visit: Payer: Self-pay

## 2013-01-17 ENCOUNTER — Telehealth: Payer: Self-pay | Admitting: Family Medicine

## 2013-01-17 NOTE — Telephone Encounter (Signed)
refill  Atorvastatin (Tab) 40 MG TAKE 1/2 TABLET BY MOUTH DAILY AT BEDTIME -- requesting a 90 day supply no last fill date --- last wrt 8.19.13 #45 x 1-refill

## 2013-01-18 MED ORDER — ATORVASTATIN CALCIUM 40 MG PO TABS: ORAL_TABLET | ORAL | Status: DC

## 2013-01-19 ENCOUNTER — Other Ambulatory Visit: Payer: Self-pay

## 2013-01-19 MED ORDER — ATORVASTATIN CALCIUM 40 MG PO TABS: ORAL_TABLET | ORAL | Status: DC

## 2013-01-23 ENCOUNTER — Other Ambulatory Visit: Payer: Self-pay | Admitting: *Deleted

## 2013-01-23 DIAGNOSIS — E7800 Pure hypercholesterolemia, unspecified: Secondary | ICD-10-CM

## 2013-01-23 MED ORDER — ATORVASTATIN CALCIUM 40 MG PO TABS: ORAL_TABLET | ORAL | Status: DC

## 2013-01-23 NOTE — Telephone Encounter (Signed)
[facility]ill for [facility]or[facility]atin [facility]

## 2013-02-13 ENCOUNTER — Telehealth: Payer: Self-pay | Admitting: Family Medicine

## 2013-02-13 MED ORDER — LISINOPRIL-HYDROCHLOROTHIAZIDE 20-25 MG PO TABS: ORAL_TABLET | ORAL | Status: DC

## 2013-02-13 NOTE — Telephone Encounter (Signed)
Letter to schedule an apt re-mailed      KP

## 2013-02-13 NOTE — Telephone Encounter (Signed)
Refill: Lisinopril-hctz 20-25 mg. Take 1 tablet by mouth daily.

## 2013-02-13 NOTE — Addendum Note (Signed)
Addended by: Arnette Norris on: 02/13/2013 02:36 PM   Modules accepted: Orders

## 2013-03-15 ENCOUNTER — Telehealth: Payer: Self-pay | Admitting: *Deleted

## 2013-03-15 MED ORDER — BUPROPION HCL ER (XL) 150 MG PO TB24: ORAL_TABLET | ORAL | Status: DC

## 2013-03-15 NOTE — Telephone Encounter (Signed)
msg left to call the office     KP 

## 2013-03-15 NOTE — Telephone Encounter (Signed)
3 months only--ov due

## 2013-03-15 NOTE — Telephone Encounter (Signed)
Last OV 05-09-12, last filled 06-26-12 #90 [phone]

## 2013-03-15 NOTE — Telephone Encounter (Signed)
Discuss with patient, Rx sent, appt scheduled. 

## 2013-05-04 ENCOUNTER — Encounter: Payer: Self-pay | Admitting: Gastroenterology

## 2013-05-10 ENCOUNTER — Ambulatory Visit (INDEPENDENT_AMBULATORY_CARE_PROVIDER_SITE_OTHER): Payer: Medicare HMO | Admitting: Family Medicine

## 2013-05-10 ENCOUNTER — Encounter: Payer: Self-pay | Admitting: Family Medicine

## 2013-05-10 VITALS — BP 118/76 | HR 70 | Temp 98.4°F | Ht 62.5 in | Wt 126.8 lb

## 2013-05-10 DIAGNOSIS — E785 Hyperlipidemia, unspecified: Secondary | ICD-10-CM

## 2013-05-10 DIAGNOSIS — I10 Essential (primary) hypertension: Secondary | ICD-10-CM

## 2013-05-10 DIAGNOSIS — Z23 Encounter for immunization: Secondary | ICD-10-CM

## 2013-05-10 DIAGNOSIS — Z0000 Encounter for general adult medical examination without abnormal findings: Secondary | ICD-10-CM

## 2013-05-10 DIAGNOSIS — E559 Vitamin D deficiency, unspecified: Secondary | ICD-10-CM

## 2013-05-10 LAB — POCT URINALYSIS DIPSTICK
Bilirubin, UA: NEGATIVE
Blood, UA: NEGATIVE
Leukocytes, UA: NEGATIVE
Nitrite, UA: NEGATIVE
Protein, UA: NEGATIVE
Urobilinogen, UA: 0.2
pH, UA: 7

## 2013-05-10 LAB — BASIC METABOLIC PANEL
CO2: 32 mEq/L (ref 19–32)
Calcium: 8.9 mg/dL (ref 8.4–10.5)
Creatinine, Ser: 0.6 mg/dL (ref 0.4–1.2)
GFR: 100.53 mL/min (ref >60.00)
Sodium: 140 mEq/L (ref 135–145)

## 2013-05-10 LAB — CBC WITH DIFFERENTIAL/PLATELET
Basophils Absolute: 0 10*3/uL (ref 0.0–0.1)
Eosinophils Absolute: 0.4 10*3/uL (ref 0.0–0.7)
HCT: 36 % (ref 36.0–46.0)
Hemoglobin: 12.1 g/dL (ref 12.0–15.0)
Lymphs Abs: 1.4 10*3/uL (ref 0.7–4.0)
MCHC: 33.8 g/dL (ref 30.0–36.0)
Monocytes Relative: 8.8 % (ref 3.0–12.0)
Neutro Abs: 3.3 10*3/uL (ref 1.4–7.7)
Platelets: 260 10*3/uL (ref 150.0–400.0)
RDW: 13.8 % (ref 11.5–14.6)

## 2013-05-10 LAB — HEPATIC FUNCTION PANEL
Alkaline Phosphatase: 48 U/L (ref 39–117)
Bilirubin, Direct: 0 mg/dL (ref 0.0–0.3)
Total Bilirubin: 0.5 mg/dL (ref 0.3–1.2)

## 2013-05-10 LAB — LIPID PANEL
HDL: 74.6 mg/dL (ref >39.00)
LDL Cholesterol: 79 mg/dL (ref 0–99)
Total CHOL/HDL Ratio: 2
Triglycerides: 44 mg/dL (ref 0.0–149.0)
VLDL: 8.8 mg/dL (ref 0.0–40.0)

## 2013-05-10 MED ORDER — ATORVASTATIN CALCIUM 20 MG PO TABS: 20.0000 mg | ORAL_TABLET | Freq: Every day | ORAL | Status: DC

## 2013-05-10 NOTE — Assessment & Plan Note (Signed)
Stable Cont meds 

## 2013-05-10 NOTE — Patient Instructions (Signed)
Preventive Care for Adults, Female A healthy lifestyle and preventive care can promote health and wellness. Preventive health guidelines for women include the following key practices.  A routine yearly physical is a good way to check with your caregiver about your health and preventive screening. It is a chance to share any concerns and updates on your health, and to receive a thorough exam.  Visit your dentist for a routine exam and preventive care every 6 months. Brush your teeth twice a day and floss once a day. Good oral hygiene prevents tooth decay and gum disease.  The frequency of eye exams is based on your age, health, family medical history, use of contact lenses, and other factors. Follow your caregiver's recommendations for frequency of eye exams.  Eat a healthy diet. Foods like vegetables, fruits, whole grains, low-fat dairy products, and lean protein foods contain the nutrients you need without too many calories. Decrease your intake of foods high in solid fats, added sugars, and salt. Eat the right amount of calories for you.Get information about a proper diet from your caregiver, if necessary.  Regular physical exercise is one of the most important things you can do for your health. Most adults should get at least 150 minutes of moderate-intensity exercise (any activity that increases your heart rate and causes you to sweat) each week. In addition, most adults need muscle-strengthening exercises on 2 or more days a week.  Maintain a healthy weight. The body mass index (BMI) is a screening tool to identify possible weight problems. It provides an estimate of body fat based on height and weight. Your caregiver can help determine your BMI, and can help you achieve or maintain a healthy weight.For adults 20 years and older:  A BMI below 18.5 is considered underweight.  A BMI of 18.5 to 24.9 is normal.  A BMI of 25 to 29.9 is considered overweight.  A BMI of 30 and above is  considered obese.  Maintain normal blood lipids and cholesterol levels by exercising and minimizing your intake of saturated fat. Eat a balanced diet with plenty of fruit and vegetables. Blood tests for lipids and cholesterol should begin at age 20 and be repeated every 5 years. If your lipid or cholesterol levels are high, you are over 50, or you are at high risk for heart disease, you may need your cholesterol levels checked more frequently.Ongoing high lipid and cholesterol levels should be treated with medicines if diet and exercise are not effective.  If you smoke, find out from your caregiver how to quit. If you do not use tobacco, do not start.  If you are pregnant, do not drink alcohol. If you are breastfeeding, be very cautious about drinking alcohol. If you are not pregnant and choose to drink alcohol, do not exceed 1 drink per day. One drink is considered to be 12 ounces (355 mL) of beer, 5 ounces (148 mL) of wine, or 1.5 ounces (44 mL) of liquor.  Avoid use of street drugs. Do not share needles with anyone. Ask for help if you need support or instructions about stopping the use of drugs.  High blood pressure causes heart disease and increases the risk of stroke. Your blood pressure should be checked at least every 1 to 2 years. Ongoing high blood pressure should be treated with medicines if weight loss and exercise are not effective.  If you are 55 to 66 years old, ask your caregiver if you should take aspirin to prevent strokes.  Diabetes   screening involves taking a blood sample to check your fasting blood sugar level. This should be done once every 3 years, after age 45, if you are within normal weight and without risk factors for diabetes. Testing should be considered at a younger age or be carried out more frequently if you are overweight and have at least 1 risk factor for diabetes.  Breast cancer screening is essential preventive care for women. You should practice "breast  self-awareness." This means understanding the normal appearance and feel of your breasts and may include breast self-examination. Any changes detected, no matter how small, should be reported to a caregiver. Women in their 20s and 30s should have a clinical breast exam (CBE) by a caregiver as part of a regular health exam every 1 to 3 years. After age 40, women should have a CBE every year. Starting at age 40, women should consider having a mammography (breast X-ray test) every year. Women who have a family history of breast cancer should talk to their caregiver about genetic screening. Women at a high risk of breast cancer should talk to their caregivers about having magnetic resonance imaging (MRI) and a mammography every year.  The Pap test is a screening test for cervical cancer. A Pap test can show cell changes on the cervix that might become cervical cancer if left untreated. A Pap test is a procedure in which cells are obtained and examined from the lower end of the uterus (cervix).  Women should have a Pap test starting at age 21.  Between ages 21 and 29, Pap tests should be repeated every 2 years.  Beginning at age 30, you should have a Pap test every 3 years as long as the past 3 Pap tests have been normal.  Some women have medical problems that increase the chance of getting cervical cancer. Talk to your caregiver about these problems. It is especially important to talk to your caregiver if a new problem develops soon after your last Pap test. In these cases, your caregiver may recommend more frequent screening and Pap tests.  The above recommendations are the same for women who have or have not gotten the vaccine for human papillomavirus (HPV).  If you had a hysterectomy for a problem that was not cancer or a condition that could lead to cancer, then you no longer need Pap tests. Even if you no longer need a Pap test, a regular exam is a good idea to make sure no other problems are  starting.  If you are between ages 65 and 70, and you have had normal Pap tests going back 10 years, you no longer need Pap tests. Even if you no longer need a Pap test, a regular exam is a good idea to make sure no other problems are starting.  If you have had past treatment for cervical cancer or a condition that could lead to cancer, you need Pap tests and screening for cancer for at least 20 years after your treatment.  If Pap tests have been discontinued, risk factors (such as a new sexual partner) need to be reassessed to determine if screening should be resumed.  The HPV test is an additional test that may be used for cervical cancer screening. The HPV test looks for the virus that can cause the cell changes on the cervix. The cells collected during the Pap test can be tested for HPV. The HPV test could be used to screen women aged 30 years and older, and should   be used in women of any age who have unclear Pap test results. After the age of 30, women should have HPV testing at the same frequency as a Pap test.  Colorectal cancer can be detected and often prevented. Most routine colorectal cancer screening begins at the age of 50 and continues through age 75. However, your caregiver may recommend screening at an earlier age if you have risk factors for colon cancer. On a yearly basis, your caregiver may provide home test kits to check for hidden blood in the stool. Use of a small camera at the end of a tube, to directly examine the colon (sigmoidoscopy or colonoscopy), can detect the earliest forms of colorectal cancer. Talk to your caregiver about this at age 50, when routine screening begins. Direct examination of the colon should be repeated every 5 to 10 years through age 75, unless early forms of pre-cancerous polyps or small growths are found.  Hepatitis C blood testing is recommended for all people born from 1945 through 1965 and any individual with known risks for hepatitis C.  Practice  safe sex. Use condoms and avoid high-risk sexual practices to reduce the spread of sexually transmitted infections (STIs). STIs include gonorrhea, chlamydia, syphilis, trichomonas, herpes, HPV, and human immunodeficiency virus (HIV). Herpes, HIV, and HPV are viral illnesses that have no cure. They can result in disability, cancer, and death. Sexually active women aged 25 and younger should be checked for chlamydia. Older women with new or multiple partners should also be tested for chlamydia. Testing for other STIs is recommended if you are sexually active and at increased risk.  Osteoporosis is a disease in which the bones lose minerals and strength with aging. This can result in serious bone fractures. The risk of osteoporosis can be identified using a bone density scan. Women ages 65 and over and women at risk for fractures or osteoporosis should discuss screening with their caregivers. Ask your caregiver whether you should take a calcium supplement or vitamin D to reduce the rate of osteoporosis.  Menopause can be associated with physical symptoms and risks. Hormone replacement therapy is available to decrease symptoms and risks. You should talk to your caregiver about whether hormone replacement therapy is right for you.  Use sunscreen with sun protection factor (SPF) of 30 or more. Apply sunscreen liberally and repeatedly throughout the day. You should seek shade when your shadow is shorter than you. Protect yourself by wearing long sleeves, pants, a wide-brimmed hat, and sunglasses year round, whenever you are outdoors.  Once a month, do a whole body skin exam, using a mirror to look at the skin on your back. Notify your caregiver of new moles, moles that have irregular borders, moles that are larger than a pencil eraser, or moles that have changed in shape or color.  Stay current with required immunizations.  Influenza. You need a dose every fall (or winter). The composition of the flu vaccine  changes each year, so being vaccinated once is not enough.  Pneumococcal polysaccharide. You need 1 to 2 doses if you smoke cigarettes or if you have certain chronic medical conditions. You need 1 dose at age 65 (or older) if you have never been vaccinated.  Tetanus, diphtheria, pertussis (Tdap, Td). Get 1 dose of Tdap vaccine if you are younger than age 65, are over 65 and have contact with an infant, are a healthcare worker, are pregnant, or simply want to be protected from whooping cough. After that, you need a Td   booster dose every 10 years. Consult your caregiver if you have not had at least 3 tetanus and diphtheria-containing shots sometime in your life or have a deep or dirty wound.  HPV. You need this vaccine if you are a woman age 26 or younger. The vaccine is given in 3 doses over 6 months.  Measles, mumps, rubella (MMR). You need at least 1 dose of MMR if you were born in 1957 or later. You may also need a second dose.  Meningococcal. If you are age 19 to 21 and a first-year college student living in a residence hall, or have one of several medical conditions, you need to get vaccinated against meningococcal disease. You may also need additional booster doses.  Zoster (shingles). If you are age 60 or older, you should get this vaccine.  Varicella (chickenpox). If you have never had chickenpox or you were vaccinated but received only 1 dose, talk to your caregiver to find out if you need this vaccine.  Hepatitis A. You need this vaccine if you have a specific risk factor for hepatitis A virus infection or you simply wish to be protected from this disease. The vaccine is usually given as 2 doses, 6 to 18 months apart.  Hepatitis B. You need this vaccine if you have a specific risk factor for hepatitis B virus infection or you simply wish to be protected from this disease. The vaccine is given in 3 doses, usually over 6 months. Preventive Services / Frequency Ages 19 to 39  Blood  pressure check.** / Every 1 to 2 years.  Lipid and cholesterol check.** / Every 5 years beginning at age 20.  Clinical breast exam.** / Every 3 years for women in their 20s and 30s.  Pap test.** / Every 2 years from ages 21 through 29. Every 3 years starting at age 30 through age 65 or 70 with a history of 3 consecutive normal Pap tests.  HPV screening.** / Every 3 years from ages 30 through ages 65 to 70 with a history of 3 consecutive normal Pap tests.  Hepatitis C blood test.** / For any individual with known risks for hepatitis C.  Skin self-exam. / Monthly.  Influenza immunization.** / Every year.  Pneumococcal polysaccharide immunization.** / 1 to 2 doses if you smoke cigarettes or if you have certain chronic medical conditions.  Tetanus, diphtheria, pertussis (Tdap, Td) immunization. / A one-time dose of Tdap vaccine. After that, you need a Td booster dose every 10 years.  HPV immunization. / 3 doses over 6 months, if you are 26 and younger.  Measles, mumps, rubella (MMR) immunization. / You need at least 1 dose of MMR if you were born in 1957 or later. You may also need a second dose.  Meningococcal immunization. / 1 dose if you are age 19 to 21 and a first-year college student living in a residence hall, or have one of several medical conditions, you need to get vaccinated against meningococcal disease. You may also need additional booster doses.  Varicella immunization.** / Consult your caregiver.  Hepatitis A immunization.** / Consult your caregiver. 2 doses, 6 to 18 months apart.  Hepatitis B immunization.** / Consult your caregiver. 3 doses usually over 6 months. Ages 40 to 64  Blood pressure check.** / Every 1 to 2 years.  Lipid and cholesterol check.** / Every 5 years beginning at age 20.  Clinical breast exam.** / Every year after age 40.  Mammogram.** / Every year beginning at age 40   and continuing for as long as you are in good health. Consult with your  caregiver.  Pap test.** / Every 3 years starting at age 30 through age 65 or 70 with a history of 3 consecutive normal Pap tests.  HPV screening.** / Every 3 years from ages 30 through ages 65 to 70 with a history of 3 consecutive normal Pap tests.  Fecal occult blood test (FOBT) of stool. / Every year beginning at age 50 and continuing until age 75. You may not need to do this test if you get a colonoscopy every 10 years.  Flexible sigmoidoscopy or colonoscopy.** / Every 5 years for a flexible sigmoidoscopy or every 10 years for a colonoscopy beginning at age 50 and continuing until age 75.  Hepatitis C blood test.** / For all people born from 1945 through 1965 and any individual with known risks for hepatitis C.  Skin self-exam. / Monthly.  Influenza immunization.** / Every year.  Pneumococcal polysaccharide immunization.** / 1 to 2 doses if you smoke cigarettes or if you have certain chronic medical conditions.  Tetanus, diphtheria, pertussis (Tdap, Td) immunization.** / A one-time dose of Tdap vaccine. After that, you need a Td booster dose every 10 years.  Measles, mumps, rubella (MMR) immunization. / You need at least 1 dose of MMR if you were born in 1957 or later. You may also need a second dose.  Varicella immunization.** / Consult your caregiver.  Meningococcal immunization.** / Consult your caregiver.  Hepatitis A immunization.** / Consult your caregiver. 2 doses, 6 to 18 months apart.  Hepatitis B immunization.** / Consult your caregiver. 3 doses, usually over 6 months. Ages 65 and over  Blood pressure check.** / Every 1 to 2 years.  Lipid and cholesterol check.** / Every 5 years beginning at age 20.  Clinical breast exam.** / Every year after age 40.  Mammogram.** / Every year beginning at age 40 and continuing for as long as you are in good health. Consult with your caregiver.  Pap test.** / Every 3 years starting at age 30 through age 65 or 70 with a 3  consecutive normal Pap tests. Testing can be stopped between 65 and 70 with 3 consecutive normal Pap tests and no abnormal Pap or HPV tests in the past 10 years.  HPV screening.** / Every 3 years from ages 30 through ages 65 or 70 with a history of 3 consecutive normal Pap tests. Testing can be stopped between 65 and 70 with 3 consecutive normal Pap tests and no abnormal Pap or HPV tests in the past 10 years.  Fecal occult blood test (FOBT) of stool. / Every year beginning at age 50 and continuing until age 75. You may not need to do this test if you get a colonoscopy every 10 years.  Flexible sigmoidoscopy or colonoscopy.** / Every 5 years for a flexible sigmoidoscopy or every 10 years for a colonoscopy beginning at age 50 and continuing until age 75.  Hepatitis C blood test.** / For all people born from 1945 through 1965 and any individual with known risks for hepatitis C.  Osteoporosis screening.** / A one-time screening for women ages 65 and over and women at risk for fractures or osteoporosis.  Skin self-exam. / Monthly.  Influenza immunization.** / Every year.  Pneumococcal polysaccharide immunization.** / 1 dose at age 65 (or older) if you have never been vaccinated.  Tetanus, diphtheria, pertussis (Tdap, Td) immunization. / A one-time dose of Tdap vaccine if you are over   65 and have contact with an infant, are a healthcare worker, or simply want to be protected from whooping cough. After that, you need a Td booster dose every 10 years.  Varicella immunization.** / Consult your caregiver.  Meningococcal immunization.** / Consult your caregiver.  Hepatitis A immunization.** / Consult your caregiver. 2 doses, 6 to 18 months apart.  Hepatitis B immunization.** / Check with your caregiver. 3 doses, usually over 6 months. ** Family history and personal history of risk and conditions may change your caregiver's recommendations. Document Released: 12/21/2001 Document Revised: 01/17/2012  Document Reviewed: 03/22/2011 [employer] Patient Information 2014 [employer].  

## 2013-05-10 NOTE — Addendum Note (Signed)
Addended by: Silvio Pate D on: 05/10/2013 03:21 PM   Modules accepted: Orders

## 2013-05-10 NOTE — Assessment & Plan Note (Signed)
Check labs 

## 2013-05-10 NOTE — Progress Notes (Signed)
Subjective:    Natasha Trujillo is a 67 y.o. female who presents for a welcome to Medicare exam.   Cardiac risk factors: advanced age (older than 69 for men, 67 for women), dyslipidemia and hypertension.  Activities of Daily Living  In your present state of health, do you have any difficulty performing the following activities?:  Preparing food and eating?: No Bathing yourself: No Getting dressed: No Using the toilet:No Moving around from place to place: No In the past year have you fallen or had a near fall?:No  Current exercise habits: Home exercise routine includes walking 5 miles 5x a week.   Dietary issues discussed: na   Depression Screen (Note: if answer to either of the following is "Yes", then a more complete depression screening is indicated)  Q1: Over the past two weeks, have you felt down, depressed or hopeless?no Q2: Over the past two weeks, have you felt little interest or pleasure in doing things? no   The following portions of the patient's history were reviewed and updated as appropriate:  She  has a past medical history of Hyperlipidemia; Hypertension; and Osteopenia. She  does not have any pertinent problems on file. She  has past surgical history that includes Tubal ligation. Her family history includes Cancer (age of onset: 89) in her maternal aunt; Cervical cancer in an unspecified family member; Coronary artery disease in an unspecified family member; Dementia in her mother; Diabetes in her other; Diabetes (age of onset: 26) in her brother; Heart disease in her brother; Heart disease (age of onset: 69) in her father and mother; Hypertension in her brother, father, and mother; and Lung cancer in an unspecified family member. She  reports that she has never smoked. She has never used smokeless tobacco. She reports that  drinks alcohol. She reports that she does not use illicit drugs. She has a current medication list which includes the following  prescription(s): aspirin, atorvastatin, bupropion, calcium, cholecalciferol, lisinopril-hydrochlorothiazide, loratadine, fish oil, and vitamin e. Current Outpatient Prescriptions on File Prior to Visit  Medication Sig Dispense Refill  . aspirin 81 MG EC tablet Take 81 mg by mouth.        Marland Kitchen atorvastatin (LIPITOR) 40 MG tablet TAKE 1/2 TABLET BY MOUTH DAILY AT BEDTIME  45 tablet  3  . buPROPion (WELLBUTRIN XL) 150 MG 24 hr tablet TAKE 1 TABLET EVERY MORNING  90 tablet  0  . Calcium 1500 MG tablet Take 1,500 mg by mouth.        . cholecalciferol (VITAMIN D) 1000 UNITS tablet Take 1,000 Units by mouth.        Marland Kitchen lisinopril-hydrochlorothiazide (PRINZIDE,ZESTORETIC) 20-25 MG per tablet TAKE 1 TABLET BY MOUTH DAILY.  90 tablet  0  . loratadine (CLARITIN) 10 MG tablet TAKE ONE TABLET BY MOUTH EVERY DAY AS NEEDED  90 tablet  1  . Omega-3 Fatty Acids (FISH OIL) 1000 MG CAPS Take 1 capsule by mouth daily.        . vitamin E 400 UNIT capsule Take 400 Units by mouth.         No current facility-administered medications on file prior to visit.   She has No Known Allergies.. Review of Systems Review of Systems  Constitutional: Negative for activity change, appetite change and fatigue.  HENT: Negative for hearing loss, congestion, tinnitus and ear discharge.  dentist q87m Eyes: Negative for visual disturbance (see optho q1y -- vision corrected to 20/20 with glasses).  Respiratory: Negative for cough, chest tightness and  shortness of breath.   Cardiovascular: Negative for chest pain, palpitations and leg swelling.  Gastrointestinal: Negative for abdominal pain, diarrhea, constipation and abdominal distention.  Genitourinary: Negative for urgency, frequency, decreased urine volume and difficulty urinating.  Musculoskeletal: Negative for back pain, arthralgias and gait problem.  Skin: Negative for color change, pallor and rash.  Neurological: Negative for dizziness, light-headedness, numbness and headaches.   Hematological: Negative for adenopathy. Does not bruise/bleed easily.  Psychiatric/Behavioral: Negative for suicidal ideas, confusion, sleep disturbance, self-injury, dysphoric mood, decreased concentration and agitation.        Objective:     Vision by Snellen chart: opth Blood pressure 118/76, pulse 70, temperature 98.4 F (36.9 C), temperature source Oral, height 5' 2.5" (1.588 m), weight 126 lb 12.8 oz (57.516 kg), SpO2 97.00%. Body mass index is 22.81 kg/(m^2). BP 118/76  Pulse 70  Temp(Src) 98.4 F (36.9 C) (Oral)  Ht 5' 2.5" (1.588 m)  Wt 126 lb 12.8 oz (57.516 kg)  BMI 22.81 kg/m2  SpO2 97% General appearance: alert, cooperative, appears stated age and no distress Head: Normocephalic, without obvious abnormality, atraumatic Eyes: conjunctivae/corneas clear. PERRL, EOM's intact. Fundi benign. Ears: normal TM's and external ear canals both ears Nose: Nares normal. Septum midline. Mucosa normal. No drainage or sinus tenderness. Throat: lips, mucosa, and tongue normal; teeth and gums normal Neck: no adenopathy, no carotid bruit, no JVD, supple, symmetrical, trachea midline and thyroid not enlarged, symmetric, no tenderness/mass/nodules Back: symmetric, no curvature. ROM normal. No CVA tenderness. Lungs: clear to auscultation bilaterally Breasts: gyn Heart: regular rate and rhythm, S1, S2 normal, no murmur, click, rub or gallop Abdomen: soft, non-tender; bowel sounds normal; no masses,  no organomegaly Pelvic: deferred-deferred Extremities: extremities normal, atraumatic, no cyanosis or edema Pulses: 2+ and symmetric Skin: Skin color, texture, turgor normal. No rashes or lesions Lymph nodes: Cervical, supraclavicular, and axillary nodes normal. Neurologic: Alert and oriented X 3, normal strength and tone. Normal symmetric reflexes. Normal coordination and gait Psych-- no depression, no anxiety      Assessment:    cpe     Plan:     During the course of the visit  the patient was educated and counseled about appropriate screening and preventive services including:   Pneumococcal vaccine   Influenza vaccine  Td vaccine  Screening electrocardiogram  Screening mammography  Screening Pap smear and pelvic exam   Bone densitometry screening  Colorectal cancer screening  Diabetes screening  Glaucoma screening  Advanced directives: has an advanced directive - a copy HAS NOT been provided.  Patient Instructions (the written plan) was given to the patient.

## 2013-05-11 LAB — MICROALBUMIN / CREATININE URINE RATIO
Creatinine, Urine: 110.1 mg/dL
Microalb Creat Ratio: 4.5 mg/g (ref 0.0–30.0)

## 2013-05-15 LAB — VITAMIN D 1,25 DIHYDROXY: Vitamin D2 1, 25 (OH)2: <8 pg/mL

## 2013-05-16 ENCOUNTER — Other Ambulatory Visit: Payer: Self-pay | Admitting: *Deleted

## 2013-05-16 MED ORDER — LISINOPRIL-HYDROCHLOROTHIAZIDE 20-25 MG PO TABS: ORAL_TABLET | ORAL | Status: DC

## 2013-05-16 NOTE — Telephone Encounter (Signed)
Rx sent 

## 2013-05-17 ENCOUNTER — Encounter: Payer: Self-pay | Admitting: Family Medicine

## 2013-05-17 ENCOUNTER — Other Ambulatory Visit: Payer: Self-pay | Admitting: Family Medicine

## 2013-05-17 DIAGNOSIS — Z0000 Encounter for general adult medical examination without abnormal findings: Secondary | ICD-10-CM

## 2013-05-17 MED ORDER — LISINOPRIL-HYDROCHLOROTHIAZIDE 20-25 MG PO TABS: ORAL_TABLET | ORAL | Status: DC

## 2013-06-18 ENCOUNTER — Other Ambulatory Visit: Payer: Self-pay | Admitting: Family Medicine

## 2013-06-18 ENCOUNTER — Other Ambulatory Visit: Payer: Self-pay | Admitting: *Deleted

## 2013-06-18 MED ORDER — BUPROPION HCL ER (XL) 150 MG PO TB24: ORAL_TABLET | ORAL | Status: DC

## 2013-06-18 NOTE — Telephone Encounter (Signed)
Rx has been refilled.  AG cma

## 2013-07-03 ENCOUNTER — Other Ambulatory Visit: Payer: Self-pay | Admitting: Obstetrics and Gynecology

## 2013-07-30 ENCOUNTER — Ambulatory Visit (AMBULATORY_SURGERY_CENTER): Payer: Self-pay | Admitting: *Deleted

## 2013-07-30 VITALS — Ht 62.0 in | Wt 130.8 lb

## 2013-07-30 DIAGNOSIS — Z86010 Personal history of colonic polyps: Secondary | ICD-10-CM

## 2013-07-30 MED ORDER — MOVIPREP 100 G PO SOLR: ORAL | Status: DC

## 2013-08-02 ENCOUNTER — Encounter: Payer: Self-pay | Admitting: Gastroenterology

## 2013-08-13 ENCOUNTER — Other Ambulatory Visit: Payer: Medicare HMO | Admitting: Gastroenterology

## 2013-08-15 ENCOUNTER — Telehealth: Payer: Self-pay | Admitting: Family Medicine

## 2013-08-15 NOTE — Telephone Encounter (Signed)
No records at this time.    KP

## 2013-08-15 NOTE — Telephone Encounter (Signed)
Patient says that she was seen at the [facility] after hours clinic on [address] this past Saturday by a Dr. Juluis Rainier for a possible meniscus tear on her LT leg. She was given crutches and a brace and was told that notes would be sent to our office requesting that Dr. Laury Axon refer her to an Orthopedic doctor. Patient is calling to find out if we have received this information.  She is out of town this week and would like to receive a referral to see an Orthopedic doctor next week when she gets back if possible. Please advise.

## 2013-08-16 NOTE — Telephone Encounter (Signed)
Detailed message advising no records received.     KP

## 2013-08-21 ENCOUNTER — Encounter: Payer: Self-pay | Admitting: Gastroenterology

## 2013-08-21 ENCOUNTER — Ambulatory Visit (AMBULATORY_SURGERY_CENTER): Payer: Medicare HMO | Admitting: Gastroenterology

## 2013-08-21 VITALS — BP 126/86 | HR 68 | Temp 98.2°F | Resp 19 | Ht 62.0 in | Wt 130.0 lb

## 2013-08-21 DIAGNOSIS — Z86010 Personal history of colonic polyps: Secondary | ICD-10-CM

## 2013-08-21 MED ORDER — SODIUM CHLORIDE 0.9 % IV SOLN: 500.0000 mL | INTRAVENOUS | Status: DC

## 2013-08-21 NOTE — Op Note (Signed)
 [facility] [address] [place], [address]   COLONOSCOPY PROCEDURE REPORT  PATIENT: Natasha, Trujillo  MR#: [id number] BIRTHDATE: 1946-03-01 , 67  yrs. old GENDER: Female ENDOSCOPIST: Rachael Fee, MD PROCEDURE DATE:  08/21/2013 PROCEDURE:   Colonoscopy, surveillance First Screening Colonoscopy - Avg.  risk and is 50 yrs.  old or older - No.  Prior Negative Screening - Now for repeat screening. N/A  History of Adenoma - Now for follow-up colonoscopy & has been > or = to 3 yrs.  Yes hx of adenoma.  Has been 3 or more years since last colonoscopy.  Polyps Removed Today? No.  Recommend repeat exam, <10 yrs? Yes.  High risk (family or personal hx). ASA CLASS:   Class II INDICATIONS:Colonoscopy 2003 with SML, 12mm adenoma removed. Colonoscopy [facility] 2007 found no polyps.Marland Kitchen MEDICATIONS: Fentanyl 75 mcg IV, Versed 7 mg IV, and These medications were titrated to patient response per physician's verbal order  DESCRIPTION OF PROCEDURE:   After the risks benefits and alternatives of the procedure were thoroughly explained, informed consent was obtained.  A digital rectal exam revealed no abnormalities of the rectum.   The LB [id number]  endoscope was introduced through the anus and advanced to the cecum, which was identified by both the appendix and ileocecal valve. No adverse events experienced.   The quality of the prep was good.  The instrument was then slowly withdrawn as the colon was fully examined.  COLON FINDINGS: A normal appearing cecum, ileocecal valve, and appendiceal orifice were identified.  The ascending, hepatic flexure, transverse, splenic flexure, descending, sigmoid colon and rectum appeared unremarkable.  No polyps or cancers were seen. Retroflexed views revealed no abnormalities. The time to cecum=6 minutes 23 seconds.  Withdrawal time=7 minutes 00 seconds.  The scope was withdrawn and the procedure completed. COMPLICATIONS: There were  no complications.  ENDOSCOPIC IMPRESSION: Normal colon No polyps or cancers  RECOMMENDATIONS: Given your personal history of adenomatous (pre-cancerous) polyps, you will need a repeat colonoscopy in 5 years.   eSigned:  Rachael Fee, MD 08/21/2013 9:03 AM   cc: Loreen Freud, MD

## 2013-08-21 NOTE — Progress Notes (Signed)
Patient did not experience any of the following events: a burn prior to discharge; a fall within the facility; wrong site/side/patient/procedure/implant event; or a hospital transfer or hospital admission upon discharge from the facility. (G8907) Patient did not have preoperative order for IV antibiotic SSI prophylaxis. (G8918)  

## 2013-08-21 NOTE — Patient Instructions (Signed)
YOU HAD AN ENDOSCOPIC PROCEDURE TODAY AT THE [facility]: Refer to the procedure report that was given to you for any specific questions about what was found during the examination.  If the procedure report does not answer your questions, please call your gastroenterologist to clarify.  If you requested that your care partner not be given the details of your procedure findings, then the procedure report has been included in a sealed envelope for you to review at your convenience later.  YOU SHOULD EXPECT: Some feelings of bloating in the abdomen. Passage of more gas than usual.  Walking can help get rid of the air that was put into your GI tract during the procedure and reduce the bloating. If you had a lower endoscopy (such as a colonoscopy or flexible sigmoidoscopy) you may notice spotting of blood in your stool or on the toilet paper. If you underwent a bowel prep for your procedure, then you may not have a normal bowel movement for a few days.  DIET: Your first meal following the procedure should be a light meal and then it is ok to progress to your normal diet.  A half-sandwich or bowl of soup is an example of a good first meal.  Heavy or fried foods are harder to digest and may make you feel nauseous or bloated.  Likewise meals heavy in dairy and vegetables can cause extra gas to form and this can also increase the bloating.  Drink plenty of fluids but you should avoid alcoholic beverages for 24 hours.  ACTIVITY: Your care partner should take you home directly after the procedure.  You should plan to take it easy, moving slowly for the rest of the day.  You can resume normal activity the day after the procedure however you should NOT DRIVE or use heavy machinery for 24 hours (because of the sedation medicines used during the test).    SYMPTOMS TO REPORT IMMEDIATELY: A gastroenterologist can be reached at any hour.  During normal business hours, 8:30 AM to 5:00 PM Monday through Friday,  call [phone].  After hours and on weekends, please call the GI answering service at [phone] who will take a message and have the physician on call contact you.   Following lower endoscopy (colonoscopy or flexible sigmoidoscopy):  Excessive amounts of blood in the stool  Significant tenderness or worsening of abdominal pains  Swelling of the abdomen that is new, acute  Fever of 100F or higher  FOLLOW UP: If any biopsies were taken you will be contacted by phone or by letter within the next 1-3 weeks.  Call your gastroenterologist if you have not heard about the biopsies in 3 weeks.  Our staff will call the home number listed on your records the next business day following your procedure to check on you and address any questions or concerns that you may have at that time regarding the information given to you following your procedure. This is a courtesy call and so if there is no answer at the home number and we have not heard from you through the emergency physician on call, we will assume that you have returned to your regular daily activities without incident.  SIGNATURES/CONFIDENTIALITY: You and/or your care partner have signed paperwork which will be entered into your electronic medical record.  These signatures attest to the fact that that the information above on your After Visit Summary has been reviewed and is understood.  Full responsibility of the confidentiality of this   discharge information lies with you and/or your care-partner.  Repeat colonoscopy in 5 years 

## 2013-08-22 ENCOUNTER — Telehealth: Payer: Self-pay

## 2013-08-22 NOTE — Telephone Encounter (Signed)
Left a message at [phone] for the pt to call if she has any questions or concerns. Maw

## 2013-09-12 HISTORY — PX: KNEE SURGERY: SHX244

## 2013-09-19 ENCOUNTER — Other Ambulatory Visit: Payer: Self-pay | Admitting: *Deleted

## 2013-09-19 MED ORDER — BUPROPION HCL ER (XL) 150 MG PO TB24: ORAL_TABLET | ORAL | Status: DC

## 2013-10-01 ENCOUNTER — Ambulatory Visit (HOSPITAL_COMMUNITY): Payer: Medicare HMO | Attending: Orthopedic Surgery

## 2013-10-01 DIAGNOSIS — M7989 Other specified soft tissue disorders: Secondary | ICD-10-CM | POA: Insufficient documentation

## 2013-10-01 DIAGNOSIS — M79609 Pain in unspecified limb: Secondary | ICD-10-CM | POA: Insufficient documentation

## 2013-10-30 ENCOUNTER — Encounter: Payer: Self-pay | Admitting: Family Medicine

## 2013-10-31 ENCOUNTER — Other Ambulatory Visit: Payer: Self-pay | Admitting: *Deleted

## 2013-10-31 DIAGNOSIS — Z9889 Other specified postprocedural states: Secondary | ICD-10-CM

## 2013-11-09 ENCOUNTER — Encounter: Payer: Self-pay | Admitting: Family Medicine

## 2013-11-09 ENCOUNTER — Other Ambulatory Visit: Payer: Self-pay | Admitting: Family Medicine

## 2013-11-09 DIAGNOSIS — M25569 Pain in unspecified knee: Secondary | ICD-10-CM

## 2013-11-09 NOTE — Telephone Encounter (Signed)
Does a new referral need to be put in, or does Natasha Trujillo have to call there?      KP

## 2013-11-09 NOTE — Telephone Encounter (Signed)
Message from Rosalita Chessman, DO sent at 11/09/2013 12:36 PM ----- This should have been done by Dr French Ana originally but since Dr Larose Kells did it we can extend the referral

## 2013-11-21 ENCOUNTER — Other Ambulatory Visit: Payer: Self-pay

## 2013-11-21 MED ORDER — LISINOPRIL-HYDROCHLOROTHIAZIDE 20-25 MG PO TABS: ORAL_TABLET | ORAL | Status: DC

## 2013-12-06 ENCOUNTER — Encounter: Payer: Self-pay | Admitting: Family Medicine

## 2013-12-06 ENCOUNTER — Ambulatory Visit (INDEPENDENT_AMBULATORY_CARE_PROVIDER_SITE_OTHER): Payer: Medicare HMO | Admitting: Family Medicine

## 2013-12-06 VITALS — BP 132/80 | HR 85 | Temp 98.6°F | Wt 134.4 lb

## 2013-12-06 DIAGNOSIS — E785 Hyperlipidemia, unspecified: Secondary | ICD-10-CM

## 2013-12-06 DIAGNOSIS — F329 Major depressive disorder, single episode, unspecified: Secondary | ICD-10-CM

## 2013-12-06 DIAGNOSIS — F32A Depression, unspecified: Secondary | ICD-10-CM

## 2013-12-06 DIAGNOSIS — R2231 Localized swelling, mass and lump, right upper limb: Secondary | ICD-10-CM

## 2013-12-06 DIAGNOSIS — F3289 Other specified depressive episodes: Secondary | ICD-10-CM

## 2013-12-06 DIAGNOSIS — R229 Localized swelling, mass and lump, unspecified: Secondary | ICD-10-CM

## 2013-12-06 DIAGNOSIS — I10 Essential (primary) hypertension: Secondary | ICD-10-CM

## 2013-12-06 LAB — HEPATIC FUNCTION PANEL
ALT: 18 U/L (ref 0–35)
AST: 19 U/L (ref 0–37)
Albumin: 4.5 g/dL (ref 3.5–5.2)
Alkaline Phosphatase: 59 U/L (ref 39–117)
BILIRUBIN DIRECT: 0.1 mg/dL (ref 0.0–0.3)
BILIRUBIN TOTAL: 0.7 mg/dL (ref 0.3–1.2)
Total Protein: 7.4 g/dL (ref 6.0–8.3)

## 2013-12-06 LAB — BASIC METABOLIC PANEL
BUN: 8 mg/dL (ref 6–23)
CHLORIDE: 97 meq/L (ref 96–112)
CO2: 28 mEq/L (ref 19–32)
CREATININE: 0.6 mg/dL (ref 0.4–1.2)
Calcium: 9.2 mg/dL (ref 8.4–10.5)
GFR: 106.17 mL/min (ref >60.00)
Glucose, Bld: 77 mg/dL (ref 70–99)
POTASSIUM: 3.8 meq/L (ref 3.5–5.1)
Sodium: 133 mEq/L — ABNORMAL LOW (ref 135–145)

## 2013-12-06 LAB — LIPID PANEL
CHOLESTEROL: 161 mg/dL (ref 0–200)
HDL: 78.3 mg/dL (ref >39.00)
LDL Cholesterol: 72 mg/dL (ref 0–99)
TRIGLYCERIDES: 52 mg/dL (ref 0.0–149.0)
Total CHOL/HDL Ratio: 2
VLDL: 10.4 mg/dL (ref 0.0–40.0)

## 2013-12-06 MED ORDER — BUPROPION HCL ER (XL) 150 MG PO TB24: ORAL_TABLET | ORAL | Status: DC

## 2013-12-06 NOTE — Progress Notes (Signed)
Pre visit review using our clinic review tool, if applicable. No additional management support is needed unless otherwise documented below in the visit note. 

## 2013-12-06 NOTE — Patient Instructions (Signed)

## 2013-12-06 NOTE — Progress Notes (Signed)
  Subjective:    Patient here for follow-up of elevated blood pressure.  She is not exercising and is adherent to a low-salt diet.  Blood pressure is well controlled at home. Cardiac symptoms: none. Patient denies: chest pain, chest pressure/discomfort, claudication, dyspnea, exertional chest pressure/discomfort, fatigue, irregular heart beat, lower extremity edema, near-syncope, orthopnea, palpitations, paroxysmal nocturnal dyspnea, syncope and tachypnea. Cardiovascular risk factors: dyslipidemia, hypertension and sedentary lifestyle. Use of agents associated with hypertension: none. History of target organ damage: none.  Pt also c/o mass in R axilla.  No pain--- she noticed it 2 weeks ago.   The following portions of the patient's history were reviewed and updated as appropriate: allergies, current medications, past family history, past medical history, past social history, past surgical history and problem list.  Review of Systems Pertinent items are noted in HPI.     Objective:    BP 132/80  Pulse 85  Temp(Src) 98.6 F (37 C) (Oral)  Wt 134 lb 6.4 oz (60.963 kg)  SpO2 99% General appearance: alert, cooperative, appears stated age and no distress Lungs: clear to auscultation bilaterally Breasts: + nodule -- R axilla , non tender, mobile Heart: regular rate and rhythm, S1, S2 normal, no murmur, click, rub or gallop Extremities: extremities normal, atraumatic, no cyanosis or edema   L axilla-- + nodule 3/4 inch diameter,  Non tender, mobil,   Breast tissue normal Assessment:    Hypertension, normal blood pressure . Evidence of target organ damage: none.    Plan:    Medication: no change. Dietary sodium restriction. Regular aerobic exercise. Follow up: 6 months and as needed.

## 2013-12-07 ENCOUNTER — Other Ambulatory Visit: Payer: Self-pay | Admitting: Family Medicine

## 2013-12-07 ENCOUNTER — Telehealth: Payer: Self-pay | Admitting: Family Medicine

## 2013-12-07 DIAGNOSIS — R2231 Localized swelling, mass and lump, right upper limb: Secondary | ICD-10-CM

## 2013-12-07 NOTE — Telephone Encounter (Signed)
Relevant patient education assigned to patient using Emmi. ° °

## 2013-12-13 ENCOUNTER — Ambulatory Visit
Admission: RE | Admit: 2013-12-13 | Discharge: 2013-12-13 | Disposition: A | Discharge status: Home / Self Care | Payer: Commercial Managed Care - HMO | Source: Ambulatory Visit | Attending: Family Medicine | Admitting: Family Medicine

## 2013-12-13 DIAGNOSIS — R2231 Localized swelling, mass and lump, right upper limb: Secondary | ICD-10-CM

## 2014-01-14 ENCOUNTER — Other Ambulatory Visit: Payer: Self-pay | Admitting: Family Medicine

## 2014-03-07 ENCOUNTER — Telehealth: Payer: Self-pay

## 2014-03-07 DIAGNOSIS — H60399 Other infective otitis externa, unspecified ear: Secondary | ICD-10-CM

## 2014-03-07 DIAGNOSIS — S0920XA Traumatic rupture of unspecified ear drum, initial encounter: Secondary | ICD-10-CM

## 2014-03-07 NOTE — Telephone Encounter (Signed)
Call from Pisek at Dr. Pollie Friar office who advised the patient is having some bleeding from her ear,and they will need a [facility] referral since she is Natasha Trujillo and her apt is scheduled for today at 3:15pm. I made Jen aware and she is working on the referral.      KP

## 2014-03-21 ENCOUNTER — Telehealth: Payer: Self-pay | Admitting: Family Medicine

## 2014-03-21 NOTE — Telephone Encounter (Signed)
[facility] referral faxed. Awaiting approval

## 2014-03-21 NOTE — Telephone Encounter (Signed)
Caller name: Natasha Trujillo Relation to BH:ALPFXTK Call back number: [phone] Pharmacy:  Reason for call: to request a referral be sent to [facility] regarding her Knee. Patient does have an appointment today at 3:30pm.   [employer] id #[id number]

## 2014-03-27 NOTE — Telephone Encounter (Signed)
Auth # [id number]  Valid 03/21/14-06/19/14 # visits: 6

## 2014-04-19 ENCOUNTER — Ambulatory Visit (INDEPENDENT_AMBULATORY_CARE_PROVIDER_SITE_OTHER): Payer: Medicare HMO | Admitting: Family Medicine

## 2014-04-19 ENCOUNTER — Other Ambulatory Visit: Payer: Self-pay

## 2014-04-19 ENCOUNTER — Encounter: Payer: Self-pay | Admitting: Family Medicine

## 2014-04-19 VITALS — BP 136/80 | HR 74 | Temp 98.6°F | Wt 127.6 lb

## 2014-04-19 DIAGNOSIS — R51 Headache: Secondary | ICD-10-CM

## 2014-04-19 DIAGNOSIS — IMO0002 Reserved for concepts with insufficient information to code with codable children: Secondary | ICD-10-CM

## 2014-04-19 DIAGNOSIS — M792 Neuralgia and neuritis, unspecified: Secondary | ICD-10-CM

## 2014-04-19 LAB — HEPATIC FUNCTION PANEL
ALT: 17 U/L (ref 0–35)
AST: 19 U/L (ref 0–37)
Albumin: 4.3 g/dL (ref 3.5–5.2)
Alkaline Phosphatase: 50 U/L (ref 39–117)
BILIRUBIN DIRECT: 0 mg/dL (ref 0.0–0.3)
BILIRUBIN TOTAL: 0.3 mg/dL (ref 0.2–1.2)
Total Protein: 7 g/dL (ref 6.0–8.3)

## 2014-04-19 LAB — CBC WITH DIFFERENTIAL/PLATELET
BASOS ABS: 0 10*3/uL (ref 0.0–0.1)
Basophils Relative: 0.6 % (ref 0.0–3.0)
Eosinophils Absolute: 0.3 10*3/uL (ref 0.0–0.7)
Eosinophils Relative: 7.7 % — ABNORMAL HIGH (ref 0.0–5.0)
HCT: 36.4 % (ref 36.0–46.0)
Hemoglobin: 12.3 g/dL (ref 12.0–15.0)
LYMPHS ABS: 1.3 10*3/uL (ref 0.7–4.0)
Lymphocytes Relative: 31.8 % (ref 12.0–46.0)
MCHC: 33.7 g/dL (ref 30.0–36.0)
MCV: 94.3 fl (ref 78.0–100.0)
MONOS PCT: 9.4 % (ref 3.0–12.0)
Monocytes Absolute: 0.4 10*3/uL (ref 0.1–1.0)
NEUTROS PCT: 50.5 % (ref 43.0–77.0)
Neutro Abs: 2 10*3/uL (ref 1.4–7.7)
PLATELETS: 323 10*3/uL (ref 150.0–400.0)
RBC: 3.86 Mil/uL — ABNORMAL LOW (ref 3.87–5.11)
RDW: 13.7 % (ref 11.5–15.5)
WBC: 4 10*3/uL (ref 4.0–10.5)

## 2014-04-19 LAB — BASIC METABOLIC PANEL
BUN: 9 mg/dL (ref 6–23)
CALCIUM: 9.5 mg/dL (ref 8.4–10.5)
CO2: 31 mEq/L (ref 19–32)
CREATININE: 0.5 mg/dL (ref 0.4–1.2)
Chloride: 96 mEq/L (ref 96–112)
GFR: 125.09 mL/min (ref >60.00)
GLUCOSE: 76 mg/dL (ref 70–99)
POTASSIUM: 3.8 meq/L (ref 3.5–5.1)
Sodium: 134 mEq/L — ABNORMAL LOW (ref 135–145)

## 2014-04-19 LAB — SEDIMENTATION RATE: Sed Rate: 13 mm/hr (ref 0–22)

## 2014-04-19 LAB — VITAMIN B12: VITAMIN B 12: 249 pg/mL (ref 211–911)

## 2014-04-19 NOTE — Progress Notes (Signed)
   Subjective:    Patient ID: YURITZA PAULHUS, female    DOB: 1946-10-14, 68 y.o.   MRN: [id number]  HPI Pt here c/o burning sensation L side head--- with sharp pain top of back of scalp.  No headaches, no vision changes.  No dizziness.  +Throbbing in head.  Has not occurred since Monday-- before that occurring 1x a day.       Review of Systems As above    Objective:   Physical Exam BP 150/80  Pulse 74  Temp(Src) 98.6 F (37 C) (Oral)  Wt 127 lb 9.6 oz (57.879 kg)  SpO2 98% General appearance: alert, cooperative, appears stated age and no distress Head: Normocephalic, without obvious abnormality, atraumatic Eyes: conjunctivae/corneas clear. PERRL, EOM's intact. Fundi benign. Ears: Myrigotomy tubes b/l -- L one maybe coming out Nose: Nares normal. Septum midline. Mucosa normal. No drainage or sinus tenderness. Throat: lips, mucosa, and tongue normal; teeth and gums normal Neck: no adenopathy, no carotid bruit, no JVD, supple, symmetrical, trachea midline and thyroid not enlarged, symmetric, no tenderness/mass/nodules Lungs: clear to auscultation bilaterally Heart: S1, S2 normal Extremities: extremities normal, atraumatic, no cyanosis or edema Neurologic: Alert and oriented X 3, normal strength and tone. Normal symmetric reflexes. Normal coordination and gait       Assessment & Plan:  1. Neuralgia Check labs If occurs again check bp and call office or go to Er If rash breaks out-- call office - Basic metabolic panel - CBC with Differential - Hepatic function panel - Vitamin B12 - Sedimentation rate  2. GDJMEQAS(341.9)  - Basic metabolic panel - CBC with Differential - Hepatic function panel - Vitamin B12 - Sedimentation rate

## 2014-04-19 NOTE — Patient Instructions (Signed)
If symptoms return -- call office Check bp if you can We will check some labs in the mean time

## 2014-04-19 NOTE — Progress Notes (Signed)
Pre visit review using our clinic review tool, if applicable. No additional management support is needed unless otherwise documented below in the visit note. 

## 2014-04-30 ENCOUNTER — Encounter: Payer: Self-pay | Admitting: Family Medicine

## 2014-05-01 ENCOUNTER — Encounter: Payer: Self-pay | Admitting: Family Medicine

## 2014-05-01 MED ORDER — PREMIUM AUTOMATIC BP MONITOR DEVI: Status: DC

## 2014-05-02 NOTE — Telephone Encounter (Signed)
Patient is scheduled to come in on 05/03/2014 at 3:15pm for her B-12 injection.

## 2014-05-03 ENCOUNTER — Ambulatory Visit (INDEPENDENT_AMBULATORY_CARE_PROVIDER_SITE_OTHER): Payer: Medicare HMO | Admitting: *Deleted

## 2014-05-03 DIAGNOSIS — E538 Deficiency of other specified B group vitamins: Secondary | ICD-10-CM

## 2014-05-03 MED ORDER — CYANOCOBALAMIN 1000 MCG/ML IJ SOLN
1000.0000 ug | Freq: Once | INTRAMUSCULAR | Status: AC
  Administered 2014-05-03: 1000 ug via INTRAMUSCULAR

## 2014-05-09 ENCOUNTER — Ambulatory Visit (INDEPENDENT_AMBULATORY_CARE_PROVIDER_SITE_OTHER): Payer: Commercial Managed Care - HMO | Admitting: *Deleted

## 2014-05-09 DIAGNOSIS — E538 Deficiency of other specified B group vitamins: Secondary | ICD-10-CM

## 2014-05-09 MED ORDER — CYANOCOBALAMIN 1000 MCG/ML IJ SOLN
1000.0000 ug | Freq: Once | INTRAMUSCULAR | Status: AC
  Administered 2014-05-09: 1000 ug via INTRAMUSCULAR

## 2014-05-13 ENCOUNTER — Encounter: Payer: Self-pay | Admitting: Family Medicine

## 2014-05-14 ENCOUNTER — Ambulatory Visit (INDEPENDENT_AMBULATORY_CARE_PROVIDER_SITE_OTHER): Payer: Commercial Managed Care - HMO

## 2014-05-14 DIAGNOSIS — E538 Deficiency of other specified B group vitamins: Secondary | ICD-10-CM

## 2014-05-14 MED ORDER — CYANOCOBALAMIN 1000 MCG/ML IJ SOLN
1000.0000 ug | Freq: Once | INTRAMUSCULAR | Status: AC
  Administered 2014-05-14: 1000 ug via INTRAMUSCULAR

## 2014-05-14 NOTE — Progress Notes (Signed)
Patient ID: Natasha Trujillo, female   DOB: 10-08-1946, 68 y.o.   MRN: [id number] Pre visit review using our clinic review tool, if applicable. No additional management support is needed unless otherwise documented below in the visit note.

## 2014-05-18 ENCOUNTER — Encounter: Payer: Self-pay | Admitting: Family Medicine

## 2014-05-30 ENCOUNTER — Ambulatory Visit (INDEPENDENT_AMBULATORY_CARE_PROVIDER_SITE_OTHER): Payer: Commercial Managed Care - HMO | Admitting: *Deleted

## 2014-05-30 DIAGNOSIS — E538 Deficiency of other specified B group vitamins: Secondary | ICD-10-CM

## 2014-05-30 MED ORDER — CYANOCOBALAMIN 1000 MCG/ML IJ SOLN
1000.0000 ug | Freq: Once | INTRAMUSCULAR | Status: AC
Start: 2014-05-30 — End: 2014-05-30
  Administered 2014-05-30: 1000 ug via INTRAMUSCULAR

## 2014-05-30 NOTE — Progress Notes (Signed)
Pt for her 4th weekly Vit B12 injection.  Pt tolerated injection well.   Next injection in 1 month and pt will schedule an appt.//AB/CMA

## 2014-06-13 ENCOUNTER — Encounter: Payer: Self-pay | Admitting: Family Medicine

## 2014-06-20 ENCOUNTER — Ambulatory Visit (INDEPENDENT_AMBULATORY_CARE_PROVIDER_SITE_OTHER): Payer: Commercial Managed Care - HMO

## 2014-06-20 ENCOUNTER — Encounter: Payer: Self-pay | Admitting: Family Medicine

## 2014-06-20 ENCOUNTER — Ambulatory Visit (INDEPENDENT_AMBULATORY_CARE_PROVIDER_SITE_OTHER): Payer: Commercial Managed Care - HMO | Admitting: Family Medicine

## 2014-06-20 VITALS — BP 132/78 | HR 68 | Temp 98.1°F | Ht 61.5 in | Wt 126.0 lb

## 2014-06-20 DIAGNOSIS — D229 Melanocytic nevi, unspecified: Secondary | ICD-10-CM

## 2014-06-20 DIAGNOSIS — E785 Hyperlipidemia, unspecified: Secondary | ICD-10-CM

## 2014-06-20 DIAGNOSIS — Z0000 Encounter for general adult medical examination without abnormal findings: Secondary | ICD-10-CM

## 2014-06-20 DIAGNOSIS — D239 Other benign neoplasm of skin, unspecified: Secondary | ICD-10-CM

## 2014-06-20 DIAGNOSIS — I10 Essential (primary) hypertension: Secondary | ICD-10-CM

## 2014-06-20 DIAGNOSIS — E538 Deficiency of other specified B group vitamins: Secondary | ICD-10-CM

## 2014-06-20 DIAGNOSIS — Z23 Encounter for immunization: Secondary | ICD-10-CM

## 2014-06-20 LAB — CBC WITH DIFFERENTIAL/PLATELET
BASOS ABS: 0.1 10*3/uL (ref 0.0–0.1)
Basophils Relative: 1.1 % (ref 0.0–3.0)
EOS PCT: 6.6 % — AB (ref 0.0–5.0)
Eosinophils Absolute: 0.3 10*3/uL (ref 0.0–0.7)
HCT: 35.3 % — ABNORMAL LOW (ref 36.0–46.0)
HEMOGLOBIN: 11.9 g/dL — AB (ref 12.0–15.0)
Lymphocytes Relative: 25.5 % (ref 12.0–46.0)
Lymphs Abs: 1.2 10*3/uL (ref 0.7–4.0)
MCHC: 33.6 g/dL (ref 30.0–36.0)
MCV: 94.1 fl (ref 78.0–100.0)
MONOS PCT: 11.9 % (ref 3.0–12.0)
Monocytes Absolute: 0.6 10*3/uL (ref 0.1–1.0)
NEUTROS ABS: 2.7 10*3/uL (ref 1.4–7.7)
Neutrophils Relative %: 54.9 % (ref 43.0–77.0)
Platelets: 309 10*3/uL (ref 150.0–400.0)
RBC: 3.76 Mil/uL — ABNORMAL LOW (ref 3.87–5.11)
RDW: 14 % (ref 11.5–15.5)
WBC: 4.9 10*3/uL (ref 4.0–10.5)

## 2014-06-20 LAB — LIPID PANEL
CHOLESTEROL: 211 mg/dL — AB (ref 0–200)
HDL: 73.3 mg/dL (ref >39.00)
LDL Cholesterol: 128 mg/dL — ABNORMAL HIGH (ref 0–99)
NONHDL: 137.7
Total CHOL/HDL Ratio: 3
Triglycerides: 51 mg/dL (ref 0.0–149.0)
VLDL: 10.2 mg/dL (ref 0.0–40.0)

## 2014-06-20 LAB — HEPATIC FUNCTION PANEL
ALT: 15 U/L (ref 0–35)
AST: 19 U/L (ref 0–37)
Albumin: 4.3 g/dL (ref 3.5–5.2)
Alkaline Phosphatase: 52 U/L (ref 39–117)
BILIRUBIN TOTAL: 0.6 mg/dL (ref 0.2–1.2)
Bilirubin, Direct: 0 mg/dL (ref 0.0–0.3)
Total Protein: 6.9 g/dL (ref 6.0–8.3)

## 2014-06-20 LAB — POCT URINALYSIS DIPSTICK
Bilirubin, UA: NEGATIVE
Blood, UA: NEGATIVE
GLUCOSE UA: NEGATIVE
Ketones, UA: NEGATIVE
LEUKOCYTES UA: NEGATIVE
NITRITE UA: NEGATIVE
Protein, UA: NEGATIVE
Spec Grav, UA: 1.01
Urobilinogen, UA: 0.2
pH, UA: 7

## 2014-06-20 LAB — BASIC METABOLIC PANEL
BUN: 9 mg/dL (ref 6–23)
CO2: 29 mEq/L (ref 19–32)
Calcium: 9.3 mg/dL (ref 8.4–10.5)
Chloride: 98 mEq/L (ref 96–112)
Creatinine, Ser: 0.7 mg/dL (ref 0.4–1.2)
GFR: 88.72 mL/min (ref >60.00)
Glucose, Bld: 78 mg/dL (ref 70–99)
Potassium: 3.8 mEq/L (ref 3.5–5.1)
Sodium: 134 mEq/L — ABNORMAL LOW (ref 135–145)

## 2014-06-20 MED ORDER — CYANOCOBALAMIN 1000 MCG/ML IJ SOLN
1000.0000 ug | Freq: Once | INTRAMUSCULAR | Status: AC
  Administered 2014-06-20: 1000 ug via INTRAMUSCULAR

## 2014-06-20 MED ORDER — LISINOPRIL-HYDROCHLOROTHIAZIDE 20-25 MG PO TABS: ORAL_TABLET | ORAL | Status: DC

## 2014-06-20 NOTE — Progress Notes (Signed)
Pre visit review using our clinic review tool, if applicable. No additional management support is needed unless otherwise documented below in the visit note. 

## 2014-06-20 NOTE — Patient Instructions (Signed)
Preventive Care for Adults A healthy lifestyle and preventive care can promote health and wellness. Preventive health guidelines for women include the following key practices.  A routine yearly physical is a good way to check with your health care provider about your health and preventive screening. It is a chance to share any concerns and updates on your health and to receive a thorough exam.  Visit your dentist for a routine exam and preventive care every 6 months. Brush your teeth twice a day and floss once a day. Good oral hygiene prevents tooth decay and gum disease.  The frequency of eye exams is based on your age, health, family medical history, use of contact lenses, and other factors. Follow your health care provider's recommendations for frequency of eye exams.  Eat a healthy diet. Foods like vegetables, fruits, whole grains, low-fat dairy products, and lean protein foods contain the nutrients you need without too many calories. Decrease your intake of foods high in solid fats, added sugars, and salt. Eat the right amount of calories for you.Get information about a proper diet from your health care provider, if necessary.  Regular physical exercise is one of the most important things you can do for your health. Most adults should get at least 150 minutes of moderate-intensity exercise (any activity that increases your heart rate and causes you to sweat) each week. In addition, most adults need muscle-strengthening exercises on 2 or more days a week.  Maintain a healthy weight. The body mass index (BMI) is a screening tool to identify possible weight problems. It provides an estimate of body fat based on height and weight. Your health care provider can find your BMI and can help you achieve or maintain a healthy weight.For adults 20 years and older:  A BMI below 18.5 is considered underweight.  A BMI of 18.5 to 24.9 is normal.  A BMI of 25 to 29.9 is considered overweight.  A BMI of  30 and above is considered obese.  Maintain normal blood lipids and cholesterol levels by exercising and minimizing your intake of saturated fat. Eat a balanced diet with plenty of fruit and vegetables. Blood tests for lipids and cholesterol should begin at age 76 and be repeated every 5 years. If your lipid or cholesterol levels are high, you are over 50, or you are at high risk for heart disease, you may need your cholesterol levels checked more frequently.Ongoing high lipid and cholesterol levels should be treated with medicines if diet and exercise are not working.  If you smoke, find out from your health care provider how to quit. If you do not use tobacco, do not start.  Lung cancer screening is recommended for adults aged 22-80 years who are at high risk for developing lung cancer because of a history of smoking. A yearly low-dose CT scan of the lungs is recommended for people who have at least a 30-pack-year history of smoking and are a current smoker or have quit within the past 15 years. A pack year of smoking is smoking an average of 1 pack of cigarettes a day for 1 year (for example: 1 pack a day for 30 years or 2 packs a day for 15 years). Yearly screening should continue until the smoker has stopped smoking for at least 15 years. Yearly screening should be stopped for people who develop a health problem that would prevent them from having lung cancer treatment.  If you are pregnant, do not drink alcohol. If you are breastfeeding,  be very cautious about drinking alcohol. If you are not pregnant and choose to drink alcohol, do not have more than 1 drink per day. One drink is considered to be 12 ounces (355 mL) of beer, 5 ounces (148 mL) of wine, or 1.5 ounces (44 mL) of liquor.  Avoid use of street drugs. Do not share needles with anyone. Ask for help if you need support or instructions about stopping the use of drugs.  High blood pressure causes heart disease and increases the risk of  stroke. Your blood pressure should be checked at least every 1 to 2 years. Ongoing high blood pressure should be treated with medicines if weight loss and exercise do not work.  If you are 75-52 years old, ask your health care provider if you should take aspirin to prevent strokes.  Diabetes screening involves taking a blood sample to check your fasting blood sugar level. This should be done once every 3 years, after age 15, if you are within normal weight and without risk factors for diabetes. Testing should be considered at a younger age or be carried out more frequently if you are overweight and have at least 1 risk factor for diabetes.  Breast cancer screening is essential preventive care for women. You should practice "breast self-awareness." This means understanding the normal appearance and feel of your breasts and may include breast self-examination. Any changes detected, no matter how small, should be reported to a health care provider. Women in their 58s and 30s should have a clinical breast exam (CBE) by a health care provider as part of a regular health exam every 1 to 3 years. After age 16, women should have a CBE every year. Starting at age 53, women should consider having a mammogram (breast X-ray test) every year. Women who have a family history of breast cancer should talk to their health care provider about genetic screening. Women at a high risk of breast cancer should talk to their health care providers about having an MRI and a mammogram every year.  Breast cancer gene (BRCA)-related cancer risk assessment is recommended for women who have family members with BRCA-related cancers. BRCA-related cancers include breast, ovarian, tubal, and peritoneal cancers. Having family members with these cancers may be associated with an increased risk for harmful changes (mutations) in the breast cancer genes BRCA1 and BRCA2. Results of the assessment will determine the need for genetic counseling and  BRCA1 and BRCA2 testing.  Routine pelvic exams to screen for cancer are no longer recommended for nonpregnant women who are considered low risk for cancer of the pelvic organs (ovaries, uterus, and vagina) and who do not have symptoms. Ask your health care provider if a screening pelvic exam is right for you.  If you have had past treatment for cervical cancer or a condition that could lead to cancer, you need Pap tests and screening for cancer for at least 20 years after your treatment. If Pap tests have been discontinued, your risk factors (such as having a new sexual partner) need to be reassessed to determine if screening should be resumed. Some women have medical problems that increase the chance of getting cervical cancer. In these cases, your health care provider may recommend more frequent screening and Pap tests.  The HPV test is an additional test that may be used for cervical cancer screening. The HPV test looks for the virus that can cause the cell changes on the cervix. The cells collected during the Pap test can be  tested for HPV. The HPV test could be used to screen women aged 30 years and older, and should be used in women of any age who have unclear Pap test results. After the age of 30, women should have HPV testing at the same frequency as a Pap test.  Colorectal cancer can be detected and often prevented. Most routine colorectal cancer screening begins at the age of 50 years and continues through age 75 years. However, your health care provider may recommend screening at an earlier age if you have risk factors for colon cancer. On a yearly basis, your health care provider may provide home test kits to check for hidden blood in the stool. Use of a small camera at the end of a tube, to directly examine the colon (sigmoidoscopy or colonoscopy), can detect the earliest forms of colorectal cancer. Talk to your health care provider about this at age 50, when routine screening begins. Direct  exam of the colon should be repeated every 5-10 years through age 75 years, unless early forms of pre-cancerous polyps or small growths are found.  People who are at an increased risk for hepatitis B should be screened for this virus. You are considered at high risk for hepatitis B if:  You were born in a country where hepatitis B occurs often. Talk with your health care provider about which countries are considered high risk.  Your parents were born in a high-risk country and you have not received a shot to protect against hepatitis B (hepatitis B vaccine).  You have HIV or AIDS.  You use needles to inject street drugs.  You live with, or have sex with, someone who has hepatitis B.  You get hemodialysis treatment.  You take certain medicines for conditions like cancer, organ transplantation, and autoimmune conditions.  Hepatitis C blood testing is recommended for all people born from 1945 through 1965 and any individual with known risks for hepatitis C.  Practice safe sex. Use condoms and avoid high-risk sexual practices to reduce the spread of sexually transmitted infections (STIs). STIs include gonorrhea, chlamydia, syphilis, trichomonas, herpes, HPV, and human immunodeficiency virus (HIV). Herpes, HIV, and HPV are viral illnesses that have no cure. They can result in disability, cancer, and death.  You should be screened for sexually transmitted illnesses (STIs) including gonorrhea and chlamydia if:  You are sexually active and are younger than 24 years.  You are older than 24 years and your health care provider tells you that you are at risk for this type of infection.  Your sexual activity has changed since you were last screened and you are at an increased risk for chlamydia or gonorrhea. Ask your health care provider if you are at risk.  If you are at risk of being infected with HIV, it is recommended that you take a prescription medicine daily to prevent HIV infection. This is  called preexposure prophylaxis (PrEP). You are considered at risk if:  You are a heterosexual woman, are sexually active, and are at increased risk for HIV infection.  You take drugs by injection.  You are sexually active with a partner who has HIV.  Talk with your health care provider about whether you are at high risk of being infected with HIV. If you choose to begin PrEP, you should first be tested for HIV. You should then be tested every 3 months for as long as you are taking PrEP.  Osteoporosis is a disease in which the bones lose minerals and strength   with aging. This can result in serious bone fractures or breaks. The risk of osteoporosis can be identified using a bone density scan. Women ages 65 years and over and women at risk for fractures or osteoporosis should discuss screening with their health care providers. Ask your health care provider whether you should take a calcium supplement or vitamin D to reduce the rate of osteoporosis.  Menopause can be associated with physical symptoms and risks. Hormone replacement therapy is available to decrease symptoms and risks. You should talk to your health care provider about whether hormone replacement therapy is right for you.  Use sunscreen. Apply sunscreen liberally and repeatedly throughout the day. You should seek shade when your shadow is shorter than you. Protect yourself by wearing long sleeves, pants, a wide-brimmed hat, and sunglasses year round, whenever you are outdoors.  Once a month, do a whole body skin exam, using a mirror to look at the skin on your back. Tell your health care provider of new moles, moles that have irregular borders, moles that are larger than a pencil eraser, or moles that have changed in shape or color.  Stay current with required vaccines (immunizations).  Influenza vaccine. All adults should be immunized every year.  Tetanus, diphtheria, and acellular pertussis (Td, Tdap) vaccine. Pregnant women should  receive 1 dose of Tdap vaccine during each pregnancy. The dose should be obtained regardless of the length of time since the last dose. Immunization is preferred during the 27th-36th week of gestation. An adult who has not previously received Tdap or who does not know her vaccine status should receive 1 dose of Tdap. This initial dose should be followed by tetanus and diphtheria toxoids (Td) booster doses every 10 years. Adults with an unknown or incomplete history of completing a 3-dose immunization series with Td-containing vaccines should begin or complete a primary immunization series including a Tdap dose. Adults should receive a Td booster every 10 years.  Varicella vaccine. An adult without evidence of immunity to varicella should receive 2 doses or a second dose if she has previously received 1 dose. Pregnant females who do not have evidence of immunity should receive the first dose after pregnancy. This first dose should be obtained before leaving the health care facility. The second dose should be obtained 4-8 weeks after the first dose.  Human papillomavirus (HPV) vaccine. Females aged 13-26 years who have not received the vaccine previously should obtain the 3-dose series. The vaccine is not recommended for use in pregnant females. However, pregnancy testing is not needed before receiving a dose. If a female is found to be pregnant after receiving a dose, no treatment is needed. In that case, the remaining doses should be delayed until after the pregnancy. Immunization is recommended for any person with an immunocompromised condition through the age of 26 years if she did not get any or all doses earlier. During the 3-dose series, the second dose should be obtained 4-8 weeks after the first dose. The third dose should be obtained 24 weeks after the first dose and 16 weeks after the second dose.  Zoster vaccine. One dose is recommended for adults aged 60 years or older unless certain conditions are  present.  Measles, mumps, and rubella (MMR) vaccine. Adults born before 1957 generally are considered immune to measles and mumps. Adults born in 1957 or later should have 1 or more doses of MMR vaccine unless there is a contraindication to the vaccine or there is laboratory evidence of immunity to   each of the three diseases. A routine second dose of MMR vaccine should be obtained at least 28 days after the first dose for students attending postsecondary schools, health care workers, or international travelers. People who received inactivated measles vaccine or an unknown type of measles vaccine during 1963-1967 should receive 2 doses of MMR vaccine. People who received inactivated mumps vaccine or an unknown type of mumps vaccine before 1979 and are at high risk for mumps infection should consider immunization with 2 doses of MMR vaccine. For females of childbearing age, rubella immunity should be determined. If there is no evidence of immunity, females who are not pregnant should be vaccinated. If there is no evidence of immunity, females who are pregnant should delay immunization until after pregnancy. Unvaccinated health care workers born before 1957 who lack laboratory evidence of measles, mumps, or rubella immunity or laboratory confirmation of disease should consider measles and mumps immunization with 2 doses of MMR vaccine or rubella immunization with 1 dose of MMR vaccine.  Pneumococcal 13-valent conjugate (PCV13) vaccine. When indicated, a person who is uncertain of her immunization history and has no record of immunization should receive the PCV13 vaccine. An adult aged 19 years or older who has certain medical conditions and has not been previously immunized should receive 1 dose of PCV13 vaccine. This PCV13 should be followed with a dose of pneumococcal polysaccharide (PPSV23) vaccine. The PPSV23 vaccine dose should be obtained at least 8 weeks after the dose of PCV13 vaccine. An adult aged 19  years or older who has certain medical conditions and previously received 1 or more doses of PPSV23 vaccine should receive 1 dose of PCV13. The PCV13 vaccine dose should be obtained 1 or more years after the last PPSV23 vaccine dose.  Pneumococcal polysaccharide (PPSV23) vaccine. When PCV13 is also indicated, PCV13 should be obtained first. All adults aged 65 years and older should be immunized. An adult younger than age 65 years who has certain medical conditions should be immunized. Any person who resides in a nursing home or long-term care facility should be immunized. An adult smoker should be immunized. People with an immunocompromised condition and certain other conditions should receive both PCV13 and PPSV23 vaccines. People with human immunodeficiency virus (HIV) infection should be immunized as soon as possible after diagnosis. Immunization during chemotherapy or radiation therapy should be avoided. Routine use of PPSV23 vaccine is not recommended for American Indians, [place] Natives, or people younger than 65 years unless there are medical conditions that require PPSV23 vaccine. When indicated, people who have unknown immunization and have no record of immunization should receive PPSV23 vaccine. One-time revaccination 5 years after the first dose of PPSV23 is recommended for people aged 19-64 years who have chronic kidney failure, nephrotic syndrome, asplenia, or immunocompromised conditions. People who received 1-2 doses of PPSV23 before age 65 years should receive another dose of PPSV23 vaccine at age 65 years or later if at least 5 years have passed since the previous dose. Doses of PPSV23 are not needed for people immunized with PPSV23 at or after age 65 years.  Meningococcal vaccine. Adults with asplenia or persistent complement component deficiencies should receive 2 doses of quadrivalent meningococcal conjugate (MenACWY-D) vaccine. The doses should be obtained at least 2 months apart.  Microbiologists working with certain meningococcal bacteria, military recruits, people at risk during an outbreak, and people who travel to or live in countries with a high rate of meningitis should be immunized. A first-year college student up through age   21 years who is living in a residence hall should receive a dose if she did not receive a dose on or after her 16th birthday. Adults who have certain high-risk conditions should receive one or more doses of vaccine.  Hepatitis A vaccine. Adults who wish to be protected from this disease, have certain high-risk conditions, work with hepatitis A-infected animals, work in hepatitis A research labs, or travel to or work in countries with a high rate of hepatitis A should be immunized. Adults who were previously unvaccinated and who anticipate close contact with an international adoptee during the first 60 days after arrival in the [place] from a country with a high rate of hepatitis A should be immunized.  Hepatitis B vaccine. Adults who wish to be protected from this disease, have certain high-risk conditions, may be exposed to blood or other infectious body fluids, are household contacts or sex partners of hepatitis B positive people, are clients or workers in certain care facilities, or travel to or work in countries with a high rate of hepatitis B should be immunized.  Haemophilus influenzae type b (Hib) vaccine. A previously unvaccinated person with asplenia or sickle cell disease or having a scheduled splenectomy should receive 1 dose of Hib vaccine. Regardless of previous immunization, a recipient of a hematopoietic stem cell transplant should receive a 3-dose series 6-12 months after her successful transplant. Hib vaccine is not recommended for adults with HIV infection. Preventive Services / Frequency Ages 64 to 68 years  Blood pressure check.** / Every 1 to 2 years.  Lipid and cholesterol check.** / Every 5 years beginning at age  22.  Clinical breast exam.** / Every 3 years for women in their 88s and 53s.  BRCA-related cancer risk assessment.** / For women who have family members with a BRCA-related cancer (breast, ovarian, tubal, or peritoneal cancers).  Pap test.** / Every 2 years from ages 90 through 51. Every 3 years starting at age 21 through age 56 or 3 with a history of 3 consecutive normal Pap tests.  HPV screening.** / Every 3 years from ages 24 through ages 1 to 46 with a history of 3 consecutive normal Pap tests.  Hepatitis C blood test.** / For any individual with known risks for hepatitis C.  Skin self-exam. / Monthly.  Influenza vaccine. / Every year.  Tetanus, diphtheria, and acellular pertussis (Tdap, Td) vaccine.** / Consult your health care provider. Pregnant women should receive 1 dose of Tdap vaccine during each pregnancy. 1 dose of Td every 10 years.  Varicella vaccine.** / Consult your health care provider. Pregnant females who do not have evidence of immunity should receive the first dose after pregnancy.  HPV vaccine. / 3 doses over 6 months, if 72 and younger. The vaccine is not recommended for use in pregnant females. However, pregnancy testing is not needed before receiving a dose.  Measles, mumps, rubella (MMR) vaccine.** / You need at least 1 dose of MMR if you were born in 1957 or later. You may also need a 2nd dose. For females of childbearing age, rubella immunity should be determined. If there is no evidence of immunity, females who are not pregnant should be vaccinated. If there is no evidence of immunity, females who are pregnant should delay immunization until after pregnancy.  Pneumococcal 13-valent conjugate (PCV13) vaccine.** / Consult your health care provider.  Pneumococcal polysaccharide (PPSV23) vaccine.** / 1 to 2 doses if you smoke cigarettes or if you have certain conditions.  Meningococcal vaccine.** /  1 dose if you are age 19 to 21 years and a first-year college  student living in a residence hall, or have one of several medical conditions, you need to get vaccinated against meningococcal disease. You may also need additional booster doses.  Hepatitis A vaccine.** / Consult your health care provider.  Hepatitis B vaccine.** / Consult your health care provider.  Haemophilus influenzae type b (Hib) vaccine.** / Consult your health care provider. Ages 40 to 64 years  Blood pressure check.** / Every 1 to 2 years.  Lipid and cholesterol check.** / Every 5 years beginning at age 20 years.  Lung cancer screening. / Every year if you are aged 55-80 years and have a 30-pack-year history of smoking and currently smoke or have quit within the past 15 years. Yearly screening is stopped once you have quit smoking for at least 15 years or develop a health problem that would prevent you from having lung cancer treatment.  Clinical breast exam.** / Every year after age 40 years.  BRCA-related cancer risk assessment.** / For women who have family members with a BRCA-related cancer (breast, ovarian, tubal, or peritoneal cancers).  Mammogram.** / Every year beginning at age 40 years and continuing for as long as you are in good health. Consult with your health care provider.  Pap test.** / Every 3 years starting at age 30 years through age 65 or 70 years with a history of 3 consecutive normal Pap tests.  HPV screening.** / Every 3 years from ages 30 years through ages 65 to 70 years with a history of 3 consecutive normal Pap tests.  Fecal occult blood test (FOBT) of stool. / Every year beginning at age 50 years and continuing until age 75 years. You may not need to do this test if you get a colonoscopy every 10 years.  Flexible sigmoidoscopy or colonoscopy.** / Every 5 years for a flexible sigmoidoscopy or every 10 years for a colonoscopy beginning at age 50 years and continuing until age 75 years.  Hepatitis C blood test.** / For all people born from 1945 through  1965 and any individual with known risks for hepatitis C.  Skin self-exam. / Monthly.  Influenza vaccine. / Every year.  Tetanus, diphtheria, and acellular pertussis (Tdap/Td) vaccine.** / Consult your health care provider. Pregnant women should receive 1 dose of Tdap vaccine during each pregnancy. 1 dose of Td every 10 years.  Varicella vaccine.** / Consult your health care provider. Pregnant females who do not have evidence of immunity should receive the first dose after pregnancy.  Zoster vaccine.** / 1 dose for adults aged 60 years or older.  Measles, mumps, rubella (MMR) vaccine.** / You need at least 1 dose of MMR if you were born in 1957 or later. You may also need a 2nd dose. For females of childbearing age, rubella immunity should be determined. If there is no evidence of immunity, females who are not pregnant should be vaccinated. If there is no evidence of immunity, females who are pregnant should delay immunization until after pregnancy.  Pneumococcal 13-valent conjugate (PCV13) vaccine.** / Consult your health care provider.  Pneumococcal polysaccharide (PPSV23) vaccine.** / 1 to 2 doses if you smoke cigarettes or if you have certain conditions.  Meningococcal vaccine.** / Consult your health care provider.  Hepatitis A vaccine.** / Consult your health care provider.  Hepatitis B vaccine.** / Consult your health care provider.  Haemophilus influenzae type b (Hib) vaccine.** / Consult your health care provider. Ages 65   years and over  Blood pressure check.** / Every 1 to 2 years.  Lipid and cholesterol check.** / Every 5 years beginning at age 22 years.  Lung cancer screening. / Every year if you are aged 73-80 years and have a 30-pack-year history of smoking and currently smoke or have quit within the past 15 years. Yearly screening is stopped once you have quit smoking for at least 15 years or develop a health problem that would prevent you from having lung cancer  treatment.  Clinical breast exam.** / Every year after age 4 years.  BRCA-related cancer risk assessment.** / For women who have family members with a BRCA-related cancer (breast, ovarian, tubal, or peritoneal cancers).  Mammogram.** / Every year beginning at age 40 years and continuing for as long as you are in good health. Consult with your health care provider.  Pap test.** / Every 3 years starting at age 9 years through age 34 or 91 years with 3 consecutive normal Pap tests. Testing can be stopped between 65 and 70 years with 3 consecutive normal Pap tests and no abnormal Pap or HPV tests in the past 10 years.  HPV screening.** / Every 3 years from ages 57 years through ages 64 or 45 years with a history of 3 consecutive normal Pap tests. Testing can be stopped between 65 and 70 years with 3 consecutive normal Pap tests and no abnormal Pap or HPV tests in the past 10 years.  Fecal occult blood test (FOBT) of stool. / Every year beginning at age 15 years and continuing until age 17 years. You may not need to do this test if you get a colonoscopy every 10 years.  Flexible sigmoidoscopy or colonoscopy.** / Every 5 years for a flexible sigmoidoscopy or every 10 years for a colonoscopy beginning at age 86 years and continuing until age 71 years.  Hepatitis C blood test.** / For all people born from [birth year] through 1964 and any individual with known risks for hepatitis C.  Osteoporosis screening.** / A one-time screening for women ages 83 years and over and women at risk for fractures or osteoporosis.  Skin self-exam. / Monthly.  Influenza vaccine. / Every year.  Tetanus, diphtheria, and acellular pertussis (Tdap/Td) vaccine.** / 1 dose of Td every 10 years.  Varicella vaccine.** / Consult your health care provider.  Zoster vaccine.** / 1 dose for adults aged 61 years or older.  Pneumococcal 13-valent conjugate (PCV13) vaccine.** / Consult your health care provider.  Pneumococcal  polysaccharide (PPSV23) vaccine.** / 1 dose for all adults aged 28 years and older.  Meningococcal vaccine.** / Consult your health care provider.  Hepatitis A vaccine.** / Consult your health care provider.  Hepatitis B vaccine.** / Consult your health care provider.  Haemophilus influenzae type b (Hib) vaccine.** / Consult your health care provider. ** Family history and personal history of risk and conditions may change your health care provider's recommendations. Document Released: 12/21/2001 Document Revised: 03/11/2014 Document Reviewed: 03/22/2011 [facility] Patient Information 2015 [place], [place]. This information is not intended to replace advice given to you by your health care provider. Make sure you discuss any questions you have with your health care provider.

## 2014-06-20 NOTE — Progress Notes (Signed)
Pt tolerated injection well

## 2014-06-20 NOTE — Addendum Note (Signed)
Addended by: Ewing Schlein on: 06/20/2014 12:01 PM   Modules accepted: Orders

## 2014-06-20 NOTE — Addendum Note (Signed)
Addended by: Rosalita Chessman on: 06/20/2014 10:47 AM   Modules accepted: Orders

## 2014-06-20 NOTE — Progress Notes (Signed)
Subjective:    Natasha Trujillo is a 68 y.o. female who presents for Medicare Initial preventive examination.  Preventive Screening-Counseling & Management  Tobacco History  Smoking status  . Never Smoker   Smokeless tobacco  . Never Used     Problems Prior to Visit 1. none  Current Problems (verified) Patient Active Problem List   Diagnosis Date Noted  . OSTEOPENIA 03/03/2010  . POSTMENOPAUSAL STATUS 07/26/2008  . HYPERLIPIDEMIA 02/28/2007  . HYPERTENSION 02/28/2007  . COLONOSCOPY, HX OF 02/28/2007  . BREAST BIOPSY, HX OF 02/28/2007    Medications Prior to Visit Current Outpatient Prescriptions on File Prior to Visit  Medication Sig Dispense Refill  . aspirin 81 MG EC tablet Take 81 mg by mouth.        . Blood Pressure Monitoring (PREMIUM AUTOMATIC BP MONITOR) DEVI digital BP monitor #245 for daily use  1 Device  0  . buPROPion (WELLBUTRIN XL) 150 MG 24 hr tablet TAKE 1 TABLET EVERY MORNING  90 tablet  1  . Calcium 1500 MG tablet Take 1,500 mg by mouth.        . cholecalciferol (VITAMIN D) 1000 UNITS tablet Take 1,000 Units by mouth.        . Diclofenac Sodium (PENNSAID TD) Place 1 application onto the skin daily.      . Loratadine 10 MG CAPS Take 1 capsule by mouth daily.      . meloxicam (MOBIC) 15 MG tablet Take 1 tablet by mouth daily.      . Multiple Vitamins-Minerals (MULTIVITAMIN PO) Take 1 tablet by mouth daily.      . Omega-3 Fatty Acids (FISH OIL) 1000 MG CAPS Take 1 capsule by mouth daily.        . vitamin E 400 UNIT capsule Take 400 Units by mouth.         No current facility-administered medications on file prior to visit.    Current Medications (verified) Current Outpatient Prescriptions  Medication Sig Dispense Refill  . aspirin 81 MG EC tablet Take 81 mg by mouth.        . Blood Pressure Monitoring (PREMIUM AUTOMATIC BP MONITOR) DEVI digital BP monitor #245 for daily use  1 Device  0  . buPROPion (WELLBUTRIN XL) 150 MG 24 hr tablet TAKE 1  TABLET EVERY MORNING  90 tablet  1  . Calcium 1500 MG tablet Take 1,500 mg by mouth.        . cholecalciferol (VITAMIN D) 1000 UNITS tablet Take 1,000 Units by mouth.        . Diclofenac Sodium (PENNSAID TD) Place 1 application onto the skin daily.      Marland Kitchen lisinopril-hydrochlorothiazide (PRINZIDE,ZESTORETIC) 20-25 MG per tablet TAKE 1 TABLET BY MOUTH DAILY.  90 tablet  3  . Loratadine 10 MG CAPS Take 1 capsule by mouth daily.      . meloxicam (MOBIC) 15 MG tablet Take 1 tablet by mouth daily.      . Multiple Vitamins-Minerals (MULTIVITAMIN PO) Take 1 tablet by mouth daily.      . Omega-3 Fatty Acids (FISH OIL) 1000 MG CAPS Take 1 capsule by mouth daily.        . vitamin E 400 UNIT capsule Take 400 Units by mouth.         No current facility-administered medications for this visit.     Allergies (verified) Adrenalin   PAST HISTORY  Family History Family History  Problem Relation Age of Onset  . Coronary artery disease  female 1st degree relative <50/female 1st degree relative <60  . Cervical cancer    . Lung cancer    . Dementia Mother   . Hypertension Mother   . Heart disease Mother 20    CABG  . Hypertension Father   . Heart disease Father 26    MI  . Diabetes Brother 16    type 1  . Hypertension Brother   . Heart disease Brother     cabg  . Cancer Maternal Aunt 68    cervical  . Diabetes Other     committed suicide  . Colon cancer Neg Hx     Social History History  Substance Use Topics  . Smoking status: Never Smoker   . Smokeless tobacco: Never Used  . Alcohol Use: Yes     Comment: occasional     Are there smokers in your home (other than you)? No  Risk Factors Current exercise habits: Home exercise routine includes pilates, utube videos. Gym/ health club routine includes light weights and low impact aerobics.  Dietary issues discussed: na   Cardiac risk factors: advanced age (older than 1 for men, 68 for women), dyslipidemia and  hypertension.  Depression Screen (Note: if answer to either of the following is "Yes", a more complete depression screening is indicated)   Over the past 2 weeks, have you felt down, depressed or hopeless? No  Over the past 2 weeks, have you felt little interest or pleasure in doing things? No  Have you lost interest or pleasure in daily life? No  Do you often feel hopeless? No  Do you cry easily over simple problems? No  Activities of Daily Living In your present state of health, do you have any difficulty performing the following activities?:  Driving? No Managing money?  No Feeding yourself? No Getting from bed to chair? No Climbing a flight of stairs? No Preparing food and eating?: No Bathing or showering? No Getting dressed: No Getting to the toilet? No Using the toilet:No Moving around from place to place: No In the past year have you fallen or had a near fall?:No   Are you sexually active?  No  Do you have more than one partner?  No  Hearing Difficulties: No Do you often ask people to speak up or repeat themselves? No Do you experience ringing or noises in your ears? No Do you have difficulty understanding soft or whispered voices? No   Do you feel that you have a problem with memory? No  Do you often misplace items? No  Do you feel safe at home?  No  Cognitive Testing  Alert? Yes  Normal Appearance?Yes  Oriented to person? Yes  Place? Yes   Time? Yes  Recall of three objects?  Yes  Can perform simple calculations? Yes  Displays appropriate judgment?Yes  Can read the correct time from a watch face?Yes   Advanced Directives have been discussed with the patient? Yes  List the Names of Other Physician/Practitioners you currently use: 1.  opth- Dr palmer 2.  Dentist--Dr Mirna Mires 3. Gyn-- [place]al 4. Derm--[place]pton 5. ENT-- Ishmael Holter any recent Medical Services you may have received from other than [facility] providers in the past year (date may be  approximate).  Immunization History  Administered Date(s) Administered  . Influenza Whole 07/26/2008, 08/14/2008, 09/22/2009, 07/24/2010  . Influenza,inj,Quad PF,36+ Mos 08/15/2013  . Pneumococcal Conjugate-13 06/20/2014  . Pneumococcal Polysaccharide-23 05/10/2013  . Td 09/22/2005, 07/26/2008  . Zoster 06/04/2008  Screening Tests Health Maintenance  Topic Date Due  . Influenza Vaccine  08/20/2014 (Originally 06/08/2014)  . Mammogram  12/14/2015  . Tetanus/tdap  07/26/2018  . Colonoscopy  08/21/2018  . Pneumococcal Polysaccharide Vaccine Age 39 And Over  Completed  . Zostavax  Completed    All answers were reviewed with the patient and necessary referrals were made:  Garnet Koyanagi, DO   06/20/2014   History reviewed:  She  has a past medical history of Hyperlipidemia; Hypertension; and Osteopenia. She  does not have any pertinent problems on file. She  has past surgical history that includes Tubal ligation and Knee surgery (Left, 09/12/13). Her family history includes Cancer (age of onset: 58) in her maternal aunt; Cervical cancer in an other family member; Coronary artery disease in an other family member; Dementia in her mother; Diabetes in her other; Diabetes (age of onset: 62) in her brother; Heart disease in her brother; Heart disease (age of onset: 70) in her father and mother; Hypertension in her brother, father, and mother; Lung cancer in an other family member. There is no history of Colon cancer. She  reports that she has never smoked. She has never used smokeless tobacco. She reports that she drinks alcohol. She reports that she does not use illicit drugs. She has a current medication list which includes the following prescription(s): aspirin, premium automatic bp monitor, bupropion, calcium, cholecalciferol, diclofenac sodium, lisinopril-hydrochlorothiazide, loratadine, meloxicam, multiple vitamins-minerals, fish oil, and vitamin e. Current Outpatient Prescriptions on File  Prior to Visit  Medication Sig Dispense Refill  . aspirin 81 MG EC tablet Take 81 mg by mouth.        . Blood Pressure Monitoring (PREMIUM AUTOMATIC BP MONITOR) DEVI digital BP monitor #245 for daily use  1 Device  0  . buPROPion (WELLBUTRIN XL) 150 MG 24 hr tablet TAKE 1 TABLET EVERY MORNING  90 tablet  1  . Calcium 1500 MG tablet Take 1,500 mg by mouth.        . cholecalciferol (VITAMIN D) 1000 UNITS tablet Take 1,000 Units by mouth.        . Diclofenac Sodium (PENNSAID TD) Place 1 application onto the skin daily.      . Loratadine 10 MG CAPS Take 1 capsule by mouth daily.      . meloxicam (MOBIC) 15 MG tablet Take 1 tablet by mouth daily.      . Multiple Vitamins-Minerals (MULTIVITAMIN PO) Take 1 tablet by mouth daily.      . Omega-3 Fatty Acids (FISH OIL) 1000 MG CAPS Take 1 capsule by mouth daily.        . vitamin E 400 UNIT capsule Take 400 Units by mouth.         No current facility-administered medications on file prior to visit.   She is allergic to adrenalin.  Review of Systems  Review of Systems  Constitutional: Negative for activity change, appetite change and fatigue.  HENT: Negative for hearing loss, congestion, tinnitus and ear discharge.   Eyes: Negative for visual disturbance (see optho q1y -- vision corrected to 20/20 with glasses).  Respiratory: Negative for cough, chest tightness and shortness of breath.   Cardiovascular: Negative for chest pain, palpitations and leg swelling.  Gastrointestinal: Negative for abdominal pain, diarrhea, constipation and abdominal distention.  Genitourinary: Negative for urgency, frequency, decreased urine volume and difficulty urinating.  Musculoskeletal: Negative for back pain, arthralgias and gait problem.  Skin: Negative for color change, pallor and rash.  Neurological: Negative for  dizziness, light-headedness, numbness and headaches.  Hematological: Negative for adenopathy. Does not bruise/bleed easily.  Psychiatric/Behavioral:  Negative for suicidal ideas, confusion, sleep disturbance, self-injury, dysphoric mood, decreased concentration and agitation.  Pt is able to read and write and can do all ADLs No risk for falling No abuse/ violence in home      Objective:     Vision by Snellen chart: opth  Body mass index is 23.42 kg/(m^2). BP 132/78  Pulse 68  Temp(Src) 98.1 F (36.7 C) (Oral)  Ht 5' 1.5" (1.562 m)  Wt 126 lb (57.153 kg)  BMI 23.42 kg/m2  SpO2 95%  BP 132/78  Pulse 68  Temp(Src) 98.1 F (36.7 C) (Oral)  Ht 5' 1.5" (1.562 m)  Wt 126 lb (57.153 kg)  BMI 23.42 kg/m2  SpO2 95% General appearance: alert, cooperative, appears stated age and no distress Head: Normocephalic, without obvious abnormality, atraumatic Eyes: negative findings: lids and lashes normal, conjunctivae and sclerae normal and pupils equal, round, reactive to light and accomodation Ears: normal TM's and external ear canals both ears and Myringtomy tubes in place Nose: Nares normal. Septum midline. Mucosa normal. No drainage or sinus tenderness. Throat: lips, mucosa, and tongue normal; teeth and gums normal Neck: no adenopathy, no carotid bruit, no JVD, supple, symmetrical, trachea midline and thyroid not enlarged, symmetric, no tenderness/mass/nodules Back: symmetric, no curvature. ROM normal. No CVA tenderness. Lungs: clear to auscultation bilaterally Breasts: gyn Heart: S1, S2 normal Abdomen: soft, non-tender; bowel sounds normal; no masses,  no organomegaly Pelvic: deferred--gyn Extremities: extremities normal, atraumatic, no cyanosis or edema Pulses: 2+ and symmetric Skin: Skin color, texture, turgor normal. No rashes or lesions Lymph nodes: Cervical, supraclavicular, and axillary nodes normal. Neurologic: Alert and oriented X 3, normal strength and tone. Normal symmetric reflexes. Normal coordination and gait Psych-- no depression, no anxiety      Assessment:     cpe     Plan:     During the course of  the visit the patient was educated and counseled about appropriate screening and preventive services including:    Pneumococcal vaccine   Influenza vaccine  Hepatitis B vaccine  Td vaccine  Screening mammography  Screening Pap smear and pelvic exam   Bone densitometry screening  Colorectal cancer screening  Diabetes screening  Glaucoma screening  Advanced directives: has an advanced directive - a copy HAS NOT been provided.  Diet review for nutrition referral? Yes ____  Not Indicated ___x_   Patient Instructions (the written plan) was given to the patient.  Medicare Attestation I have personally reviewed: The patient's medical and social history Their use of alcohol, tobacco or illicit drugs Their current medications and supplements The patient's functional ability including ADLs,fall risks, home safety risks, cognitive, and hearing and visual impairment Diet and physical activities Evidence for depression or mood disorders  The patient's weight, height, BMI, and visual acuity have been recorded in the chart.  I have made referrals, counseling, and provided education to the patient based on review of the above and I have provided the patient with a written personalized care plan for preventive services.    1. Need for pneumococcal vaccination  - Pneumococcal conjugate vaccine 13-valent  2. Medicare annual wellness visit, initial   3. Essential hypertension  - lisinopril-hydrochlorothiazide (PRINZIDE,ZESTORETIC) 20-25 MG per tablet; TAKE 1 TABLET BY MOUTH DAILY.  Dispense: 90 tablet; Refill: 3 4.  B12 deficiency--  Cont injections  Garnet Koyanagi, DO   06/20/2014

## 2014-06-26 ENCOUNTER — Encounter: Payer: Self-pay | Admitting: Family Medicine

## 2014-07-19 ENCOUNTER — Other Ambulatory Visit (INDEPENDENT_AMBULATORY_CARE_PROVIDER_SITE_OTHER): Payer: Commercial Managed Care - HMO

## 2014-07-19 ENCOUNTER — Ambulatory Visit: Payer: Commercial Managed Care - HMO | Admitting: Family Medicine

## 2014-07-19 ENCOUNTER — Telehealth: Payer: Self-pay

## 2014-07-19 ENCOUNTER — Ambulatory Visit (INDEPENDENT_AMBULATORY_CARE_PROVIDER_SITE_OTHER): Payer: Commercial Managed Care - HMO

## 2014-07-19 DIAGNOSIS — E538 Deficiency of other specified B group vitamins: Secondary | ICD-10-CM

## 2014-07-19 LAB — VITAMIN B12: Vitamin B-12: 467 pg/mL (ref 211–911)

## 2014-07-19 MED ORDER — CYANOCOBALAMIN 1000 MCG/ML IJ SOLN
1000.0000 ug | Freq: Once | INTRAMUSCULAR | Status: AC
  Administered 2014-07-19: 1000 ug via INTRAMUSCULAR

## 2014-07-19 NOTE — Progress Notes (Signed)
Pt tolerated injection well

## 2014-07-19 NOTE — Telephone Encounter (Signed)
Vitamin b-12 lab order placed per Dr. Nonda Lou request.

## 2014-08-13 ENCOUNTER — Other Ambulatory Visit: Payer: Self-pay | Admitting: Obstetrics and Gynecology

## 2014-08-13 LAB — HM PAP SMEAR

## 2014-08-14 LAB — CYTOLOGY - PAP

## 2014-12-12 ENCOUNTER — Other Ambulatory Visit: Payer: Self-pay | Admitting: Family Medicine

## 2014-12-16 ENCOUNTER — Encounter: Payer: Self-pay | Admitting: Family Medicine

## 2014-12-16 DIAGNOSIS — E785 Hyperlipidemia, unspecified: Secondary | ICD-10-CM

## 2014-12-17 MED ORDER — ATORVASTATIN CALCIUM 20 MG PO TABS: 20.0000 mg | ORAL_TABLET | Freq: Every day | ORAL | Status: DC

## 2014-12-19 ENCOUNTER — Telehealth: Payer: Self-pay

## 2014-12-19 DIAGNOSIS — E611 Iron deficiency: Secondary | ICD-10-CM

## 2014-12-19 DIAGNOSIS — E785 Hyperlipidemia, unspecified: Secondary | ICD-10-CM

## 2014-12-19 NOTE — Telephone Encounter (Signed)
-----   Message from Irven Baltimore sent at 12/19/2014  9:53 AM EST ----- Patient scheduled lab appointment per [facility] message. Please enter orders.

## 2014-12-19 NOTE — Telephone Encounter (Signed)
Orders are in.     KP 

## 2014-12-20 ENCOUNTER — Other Ambulatory Visit (INDEPENDENT_AMBULATORY_CARE_PROVIDER_SITE_OTHER): Payer: Commercial Managed Care - HMO

## 2014-12-20 DIAGNOSIS — D509 Iron deficiency anemia, unspecified: Secondary | ICD-10-CM | POA: Diagnosis not present

## 2014-12-20 DIAGNOSIS — E611 Iron deficiency: Secondary | ICD-10-CM

## 2014-12-20 DIAGNOSIS — E785 Hyperlipidemia, unspecified: Secondary | ICD-10-CM | POA: Diagnosis not present

## 2014-12-20 LAB — CBC WITH DIFFERENTIAL/PLATELET
Basophils Absolute: 0 10*3/uL (ref 0.0–0.1)
Basophils Relative: 0.9 % (ref 0.0–3.0)
Eosinophils Absolute: 0.5 10*3/uL (ref 0.0–0.7)
Eosinophils Relative: 10.9 % — ABNORMAL HIGH (ref 0.0–5.0)
HEMATOCRIT: 36.6 % (ref 36.0–46.0)
Hemoglobin: 12.3 g/dL (ref 12.0–15.0)
LYMPHS ABS: 1.4 10*3/uL (ref 0.7–4.0)
LYMPHS PCT: 32.8 % (ref 12.0–46.0)
MCHC: 33.6 g/dL (ref 30.0–36.0)
MCV: 93.9 fl (ref 78.0–100.0)
MONOS PCT: 10.5 % (ref 3.0–12.0)
Monocytes Absolute: 0.5 10*3/uL (ref 0.1–1.0)
Neutro Abs: 2 10*3/uL (ref 1.4–7.7)
Neutrophils Relative %: 44.9 % (ref 43.0–77.0)
PLATELETS: 311 10*3/uL (ref 150.0–400.0)
RBC: 3.9 Mil/uL (ref 3.87–5.11)
RDW: 14.5 % (ref 11.5–15.5)
WBC: 4.4 10*3/uL (ref 4.0–10.5)

## 2014-12-20 LAB — HEPATIC FUNCTION PANEL
ALBUMIN: 4.3 g/dL (ref 3.5–5.2)
ALT: 15 U/L (ref 0–35)
AST: 18 U/L (ref 0–37)
Alkaline Phosphatase: 55 U/L (ref 39–117)
Bilirubin, Direct: 0.1 mg/dL (ref 0.0–0.3)
Total Bilirubin: 0.5 mg/dL (ref 0.2–1.2)
Total Protein: 6.9 g/dL (ref 6.0–8.3)

## 2014-12-20 LAB — LIPID PANEL
CHOL/HDL RATIO: 2
Cholesterol: 160 mg/dL (ref 0–200)
HDL: 71.5 mg/dL (ref >39.00)
LDL CALC: 78 mg/dL (ref 0–99)
NONHDL: 88.5
TRIGLYCERIDES: 51 mg/dL (ref 0.0–149.0)
VLDL: 10.2 mg/dL (ref 0.0–40.0)

## 2014-12-20 LAB — IBC PANEL
Iron: 105 ug/dL (ref 42–145)
Saturation Ratios: 30.6 % (ref 20.0–50.0)
Transferrin: 245 mg/dL (ref 212.0–360.0)

## 2014-12-20 NOTE — Addendum Note (Signed)
Addended by: Harl Bowie on: 12/20/2014 11:07 AM   Modules accepted: Orders

## 2014-12-27 ENCOUNTER — Encounter: Payer: Self-pay | Admitting: Family Medicine

## 2015-02-03 ENCOUNTER — Ambulatory Visit (INDEPENDENT_AMBULATORY_CARE_PROVIDER_SITE_OTHER): Payer: Commercial Managed Care - HMO | Admitting: Family Medicine

## 2015-02-03 ENCOUNTER — Encounter: Payer: Self-pay | Admitting: Family Medicine

## 2015-02-03 VITALS — BP 162/84 | HR 66 | Temp 98.5°F | Wt 127.2 lb

## 2015-02-03 DIAGNOSIS — M25561 Pain in right knee: Secondary | ICD-10-CM | POA: Diagnosis not present

## 2015-02-03 NOTE — Progress Notes (Signed)
Natasha Trujillo is a 69 y.o. female   HPI:  Knee Pain: Patient presents with knee pain involving the  right knee. Onset of the symptoms was a week ago. Inciting event: none known. Current symptoms include popping sensation.  She had been walking in [place] last week and both knees hurt.  Yesterday she felt/heard a loud pop in knee and had excruciating pain in knee and behind knee. Pain is  Better today with oxycodone and rest.   Pain is aggravated by any weight bearing.  Patient has had prior knee problems. Evaluation to date: none. Treatment to date: none---- prior surgery on L knee.   Pain feels that the pain is the same as prior to knee surgery on L with Dr French Ana..     Past Medical History  Diagnosis Date  . Hyperlipidemia   . Hypertension   . Osteopenia    Past Surgical History  Procedure Laterality Date  . Tubal ligation    . Knee surgery Left 09/12/13    Current outpatient prescriptions:  .  aspirin 81 MG EC tablet, Take 81 mg by mouth.  , Disp: , Rfl:  .  atorvastatin (LIPITOR) 20 MG tablet, Take 1 tablet (20 mg total) by mouth daily., Disp: 90 tablet, Rfl: 0 .  buPROPion (WELLBUTRIN XL) 150 MG 24 hr tablet, Take 1 tablet (150 mg total) by mouth every morning. Office visit due now, Disp: 90 tablet, Rfl: 0 .  Calcium 1500 MG tablet, Take 1,500 mg by mouth.  , Disp: , Rfl:  .  cholecalciferol (VITAMIN D) 1000 UNITS tablet, Take 1,000 Units by mouth.  , Disp: , Rfl:  .  diclofenac (VOLTAREN) 50 MG EC tablet, Take 50 mg by mouth 2 (two) times daily., Disp: , Rfl:  .  Diclofenac Sodium (PENNSAID TD), Place 1 application onto the skin daily., Disp: , Rfl:  .  lisinopril-hydrochlorothiazide (PRINZIDE,ZESTORETIC) 20-25 MG per tablet, TAKE 1 TABLET BY MOUTH DAILY., Disp: 90 tablet, Rfl: 3 .  Loratadine 10 MG CAPS, Take 1 capsule by mouth daily., Disp: , Rfl:  .  meloxicam (MOBIC) 15 MG tablet, Take 1 tablet by mouth daily., Disp: , Rfl:  .  Multiple Vitamins-Minerals (MULTIVITAMIN PO),  Take 1 tablet by mouth daily., Disp: , Rfl:  .  Omega-3 Fatty Acids (FISH OIL) 1000 MG CAPS, Take 1 capsule by mouth daily.  , Disp: , Rfl:  .  vitamin E 400 UNIT capsule, Take 400 Units by mouth.  , Disp: , Rfl:  Allergies  Allergen Reactions  . Adrenalin [Epinephrine] Other (See Comments)    dizziness    reports that she has never smoked. She has never used smokeless tobacco. She reports that she drinks alcohol. She reports that she does not use illicit drugs. Family History  Problem Relation Age of Onset  . Coronary artery disease      female 1st degree relative <50/female 1st degree relative <60  . Cervical cancer    . Lung cancer    . Dementia Mother   . Hypertension Mother   . Heart disease Mother 26    CABG  . Hypertension Father   . Heart disease Father 36    MI  . Diabetes Brother 16    type 1  . Hypertension Brother   . Heart disease Brother     cabg  . Cancer Maternal Aunt 68    cervical  . Diabetes Other     committed suicide  . Colon cancer Neg  Hx     Knee: Normal to inspection with no erythema or effusion or obvious bony abnormalities. Palpation normal with no warmth or joint line tenderness or patellar tenderness or condyle tenderness. ROM normal in flexion and extension and lower leg rotation. Non painful patellar compression. Patellar and quadriceps tendons unremarkable. Hamstring and quadriceps strength is normal.

## 2015-02-03 NOTE — Patient Instructions (Addendum)

## 2015-02-03 NOTE — Assessment & Plan Note (Signed)
Refer ortho Knee sleeve

## 2015-02-03 NOTE — Progress Notes (Signed)
Pre visit review using our clinic review tool, if applicable. No additional management support is needed unless otherwise documented below in the visit note. 

## 2015-02-04 DIAGNOSIS — M25561 Pain in right knee: Secondary | ICD-10-CM | POA: Diagnosis not present

## 2015-02-09 ENCOUNTER — Encounter: Payer: Self-pay | Admitting: Family Medicine

## 2015-02-10 ENCOUNTER — Other Ambulatory Visit: Payer: Self-pay | Admitting: Family Medicine

## 2015-02-10 DIAGNOSIS — H748X9 Other specified disorders of middle ear and mastoid, unspecified ear: Secondary | ICD-10-CM

## 2015-02-12 DIAGNOSIS — H6522 Chronic serous otitis media, left ear: Secondary | ICD-10-CM | POA: Diagnosis not present

## 2015-03-18 ENCOUNTER — Other Ambulatory Visit: Payer: Self-pay | Admitting: Family Medicine

## 2015-03-18 ENCOUNTER — Encounter: Payer: Self-pay | Admitting: Family Medicine

## 2015-03-18 DIAGNOSIS — D229 Melanocytic nevi, unspecified: Secondary | ICD-10-CM

## 2015-03-26 ENCOUNTER — Other Ambulatory Visit: Payer: Self-pay | Admitting: Family Medicine

## 2015-03-28 DIAGNOSIS — L814 Other melanin hyperpigmentation: Secondary | ICD-10-CM | POA: Diagnosis not present

## 2015-03-28 DIAGNOSIS — D225 Melanocytic nevi of trunk: Secondary | ICD-10-CM | POA: Diagnosis not present

## 2015-03-28 DIAGNOSIS — L821 Other seborrheic keratosis: Secondary | ICD-10-CM | POA: Diagnosis not present

## 2015-03-28 DIAGNOSIS — D1801 Hemangioma of skin and subcutaneous tissue: Secondary | ICD-10-CM | POA: Diagnosis not present

## 2015-03-28 DIAGNOSIS — L820 Inflamed seborrheic keratosis: Secondary | ICD-10-CM | POA: Diagnosis not present

## 2015-03-28 DIAGNOSIS — D2272 Melanocytic nevi of left lower limb, including hip: Secondary | ICD-10-CM | POA: Diagnosis not present

## 2015-04-10 ENCOUNTER — Encounter: Payer: Self-pay | Admitting: Family Medicine

## 2015-04-10 DIAGNOSIS — M25552 Pain in left hip: Secondary | ICD-10-CM

## 2015-04-11 MED ORDER — BUPROPION HCL ER (XL) 150 MG PO TB24
150.0000 mg | ORAL_TABLET | Freq: Every morning | ORAL | Status: DC
Start: 2015-04-11 — End: 2015-10-20

## 2015-07-29 ENCOUNTER — Encounter: Payer: Self-pay | Admitting: Family Medicine

## 2015-07-31 ENCOUNTER — Encounter: Payer: Self-pay | Admitting: Family Medicine

## 2015-08-12 ENCOUNTER — Encounter: Payer: Self-pay | Admitting: Family Medicine

## 2015-08-19 DIAGNOSIS — M8588 Other specified disorders of bone density and structure, other site: Secondary | ICD-10-CM | POA: Diagnosis not present

## 2015-08-19 DIAGNOSIS — Z13820 Encounter for screening for osteoporosis: Secondary | ICD-10-CM | POA: Diagnosis not present

## 2015-08-19 DIAGNOSIS — Z6823 Body mass index (BMI) 23.0-23.9, adult: Secondary | ICD-10-CM | POA: Diagnosis not present

## 2015-08-19 DIAGNOSIS — Z1231 Encounter for screening mammogram for malignant neoplasm of breast: Secondary | ICD-10-CM | POA: Diagnosis not present

## 2015-08-19 DIAGNOSIS — Z01419 Encounter for gynecological examination (general) (routine) without abnormal findings: Secondary | ICD-10-CM | POA: Diagnosis not present

## 2015-08-19 DIAGNOSIS — N958 Other specified menopausal and perimenopausal disorders: Secondary | ICD-10-CM | POA: Diagnosis not present

## 2015-08-19 DIAGNOSIS — Z1212 Encounter for screening for malignant neoplasm of rectum: Secondary | ICD-10-CM | POA: Diagnosis not present

## 2015-08-19 DIAGNOSIS — E559 Vitamin D deficiency, unspecified: Secondary | ICD-10-CM | POA: Diagnosis not present

## 2015-08-19 LAB — HM DEXA SCAN

## 2015-08-28 LAB — HM MAMMOGRAPHY

## 2015-08-29 ENCOUNTER — Other Ambulatory Visit: Payer: Self-pay | Admitting: Family Medicine

## 2015-09-19 ENCOUNTER — Ambulatory Visit (INDEPENDENT_AMBULATORY_CARE_PROVIDER_SITE_OTHER): Payer: Commercial Managed Care - HMO | Admitting: Family Medicine

## 2015-09-19 ENCOUNTER — Encounter: Payer: Self-pay | Admitting: Family Medicine

## 2015-09-19 VITALS — BP 122/70 | HR 83 | Temp 98.4°F | Wt 122.4 lb

## 2015-09-19 DIAGNOSIS — I10 Essential (primary) hypertension: Secondary | ICD-10-CM | POA: Diagnosis not present

## 2015-09-19 DIAGNOSIS — J302 Other seasonal allergic rhinitis: Secondary | ICD-10-CM | POA: Diagnosis not present

## 2015-09-19 DIAGNOSIS — J209 Acute bronchitis, unspecified: Secondary | ICD-10-CM | POA: Diagnosis not present

## 2015-09-19 DIAGNOSIS — J0110 Acute frontal sinusitis, unspecified: Secondary | ICD-10-CM

## 2015-09-19 MED ORDER — AMLODIPINE BESYLATE 5 MG PO TABS: 5.0000 mg | ORAL_TABLET | Freq: Every day | ORAL | Status: DC

## 2015-09-19 MED ORDER — LORATADINE 10 MG PO TABS: 10.0000 mg | ORAL_TABLET | Freq: Every day | ORAL | Status: DC

## 2015-09-19 MED ORDER — PREDNISONE 10 MG PO TABS: ORAL_TABLET | ORAL | Status: DC

## 2015-09-19 MED ORDER — AMOXICILLIN-POT CLAVULANATE 875-125 MG PO TABS: 1.0000 | ORAL_TABLET | Freq: Two times a day (BID) | ORAL | Status: DC

## 2015-09-19 NOTE — Patient Instructions (Signed)

## 2015-09-19 NOTE — Progress Notes (Signed)
Pre visit review using our clinic review tool, if applicable. No additional management support is needed unless otherwise documented below in the visit note. 

## 2015-09-19 NOTE — Progress Notes (Signed)
Patient ID: Natasha Trujillo, female    DOB: 1946/05/04  Age: 69 y.o. MRN: [id number]    Subjective:  Subjective HPI Natasha Trujillo presents for congestion and sore throat x 8 days, + wheezing , runny nose, +yellow / green mucous  Review of Systems  Constitutional: Positive for fatigue. Negative for fever and chills.  HENT: Positive for congestion, postnasal drip, rhinorrhea and sinus pressure.   Respiratory: Positive for cough, chest tightness, shortness of breath and wheezing.   Cardiovascular: Negative for chest pain, palpitations and leg swelling.  Allergic/Immunologic: Negative for environmental allergies.    History Past Medical History  Diagnosis Date  . Hyperlipidemia   . Hypertension   . Osteopenia     She has past surgical history that includes Tubal ligation and Knee surgery (Left, 09/12/13).   Her family history includes Cancer (age of onset: 108) in her maternal aunt; Cervical cancer in an other family member; Coronary artery disease in an other family member; Dementia in her mother; Diabetes in her other; Diabetes (age of onset: 71) in her brother; Heart disease in her brother; Heart disease (age of onset: 60) in her father and mother; Hypertension in her brother, father, and mother; Lung cancer in an other family member. There is no history of Colon cancer.She reports that she has never smoked. She has never used smokeless tobacco. She reports that she drinks alcohol. She reports that she does not use illicit drugs.  Current Outpatient Prescriptions on File Prior to Visit  Medication Sig Dispense Refill  . aspirin 81 MG EC tablet Take 81 mg by mouth.      Marland Kitchen atorvastatin (LIPITOR) 20 MG tablet TAKE 1 TABLET EVERY DAY 90 tablet 3  . buPROPion (WELLBUTRIN XL) 150 MG 24 hr tablet Take 1 tablet (150 mg total) by mouth every morning. 90 tablet 1  . Calcium 1500 MG tablet Take 1,500 mg by mouth.      . cholecalciferol (VITAMIN D) 1000 UNITS tablet Take 1,000 Units by  mouth.      . diclofenac (VOLTAREN) 50 MG EC tablet Take 50 mg by mouth 2 (two) times daily.    . Diclofenac Sodium (PENNSAID TD) Place 1 application onto the skin daily.    . meloxicam (MOBIC) 15 MG tablet Take 1 tablet by mouth daily.    . Multiple Vitamins-Minerals (MULTIVITAMIN PO) Take 1 tablet by mouth daily.    . Omega-3 Fatty Acids (FISH OIL) 1000 MG CAPS Take 1 capsule by mouth daily.      . vitamin E 400 UNIT capsule Take 400 Units by mouth.       No current facility-administered medications on file prior to visit.     Objective:  Objective Physical Exam  Constitutional: She is oriented to person, place, and time. She appears well-developed and well-nourished.  HENT:  Right Ear: Hearing, tympanic membrane, external ear and ear canal normal.  Left Ear: Hearing, tympanic membrane, external ear and ear canal normal.  Nose: Right sinus exhibits maxillary sinus tenderness and frontal sinus tenderness. Left sinus exhibits maxillary sinus tenderness and frontal sinus tenderness.  Mouth/Throat: Posterior oropharyngeal erythema present. No oropharyngeal exudate.  + PND + errythema  Eyes: Conjunctivae are normal. Right eye exhibits no discharge. Left eye exhibits no discharge.  Neck: Neck supple.  Cardiovascular: Normal rate, regular rhythm and normal heart sounds.   No murmur heard. Pulmonary/Chest: Effort normal and breath sounds normal. No respiratory distress. She has no wheezes. She has no rales. She exhibits  no tenderness.  Musculoskeletal: She exhibits no edema.  Lymphadenopathy:    She has cervical adenopathy.  Neurological: She is alert and oriented to person, place, and time.  Nursing note and vitals reviewed.  BP 122/70 mmHg  Pulse 83  Temp(Src) 98.4 F (36.9 C) (Oral)  Wt 122 lb 6.4 oz (55.52 kg)  SpO2 98% Wt Readings from Last 3 Encounters:  09/19/15 122 lb 6.4 oz (55.52 kg)  02/03/15 127 lb 3.2 oz (57.698 kg)  06/20/14 126 lb (57.153 kg)     Lab Results    Component Value Date   WBC 4.4 12/20/2014   HGB 12.3 12/20/2014   HCT 36.6 12/20/2014   PLT 311.0 12/20/2014   GLUCOSE 78 06/20/2014   CHOL 160 12/20/2014   TRIG 51.0 12/20/2014   HDL 71.50 12/20/2014   LDLCALC 78 12/20/2014   ALT 15 12/20/2014   AST 18 12/20/2014   NA 134* 06/20/2014   K 3.8 06/20/2014   CL 98 06/20/2014   CREATININE 0.7 06/20/2014   BUN 9 06/20/2014   CO2 29 06/20/2014   TSH 1.95 05/09/2012   MICROALBUR 0.50 05/10/2013    Mm Digital Diagnostic Unilat R  12/13/2013  CLINICAL DATA:  Palpable abnormality in the right axilla. EXAM: DIGITAL DIAGNOSTIC  right MAMMOGRAM WITH CAD ULTRASOUND right BREAST COMPARISON:  03/21/2012 and earlier ACR Breast Density Category d: The breast tissue is extremely dense, which lowers the sensitivity of mammography. FINDINGS: No suspicious mass, distortion, or microcalcifications are identified to suggest presence of malignancy. Mammographic images were processed with CAD. On physical exam, the area of patient's concern is high within the right axilla, overlying the humeral head when the patient's arm is extended/hyperextended. On physical exam I palpate the humeral head itself. I palpate no discrete mass. I palpate no adenopathy. Ultrasound is performed, showing normal appearing right axillary contents and lymph nodes. The area of concern correlates with the humeral head when hyperextended. IMPRESSION: 1.  No mammographic or ultrasound evidence for malignancy. 2. The area of concern within the right axilla appears to correlate with the humeral head. RECOMMENDATION: Screening mammogram is suggested in August 2015. I have discussed the findings and recommendations with the patient. Results were also provided in writing at the conclusion of the visit. BI-RADS CATEGORY  1: Negative Electronically Signed   By: Shon Hale M.D.   On: 12/13/2013 17:23   US Breast Ltd Uni Right Inc Axilla  12/13/2013  CLINICAL DATA:  Palpable abnormality in the right  axilla. EXAM: DIGITAL DIAGNOSTIC  right MAMMOGRAM WITH CAD ULTRASOUND right BREAST COMPARISON:  03/21/2012 and earlier ACR Breast Density Category d: The breast tissue is extremely dense, which lowers the sensitivity of mammography. FINDINGS: No suspicious mass, distortion, or microcalcifications are identified to suggest presence of malignancy. Mammographic images were processed with CAD. On physical exam, the area of patient's concern is high within the right axilla, overlying the humeral head when the patient's arm is extended/hyperextended. On physical exam I palpate the humeral head itself. I palpate no discrete mass. I palpate no adenopathy. Ultrasound is performed, showing normal appearing right axillary contents and lymph nodes. The area of concern correlates with the humeral head when hyperextended. IMPRESSION: 1.  No mammographic or ultrasound evidence for malignancy. 2. The area of concern within the right axilla appears to correlate with the humeral head. RECOMMENDATION: Screening mammogram is suggested in August 2015. I have discussed the findings and recommendations with the patient. Results were also provided in writing at the  conclusion of the visit. BI-RADS CATEGORY  1: Negative Electronically Signed   By: Shon Hale M.D.   On: 12/13/2013 17:23     Assessment & Plan:  Plan I have discontinued Ms. Zale's lisinopril-hydrochlorothiazide. I am also having her start on predniSONE and amoxicillin-clavulanate. Additionally, I am having her maintain her aspirin, Calcium, Fish Oil, vitamin E, cholecalciferol, meloxicam, Multiple Vitamins-Minerals (MULTIVITAMIN PO), Diclofenac Sodium (PENNSAID TD), diclofenac, atorvastatin, buPROPion, loratadine, and amLODipine.  Meds ordered this encounter  Medications  . DISCONTD: loratadine (CLARITIN) 10 MG tablet    Sig: Take 1 tablet (10 mg total) by mouth daily.    Dispense:  90 tablet    Refill:  3  . DISCONTD: amLODipine (NORVASC) 5 MG tablet     Sig: Take 1 tablet (5 mg total) by mouth daily.    Dispense:  90 tablet    Refill:  3  . loratadine (CLARITIN) 10 MG tablet    Sig: Take 1 tablet (10 mg total) by mouth daily.    Dispense:  90 tablet    Refill:  3  . amLODipine (NORVASC) 5 MG tablet    Sig: Take 1 tablet (5 mg total) by mouth daily.    Dispense:  30 tablet    Refill:  3    D/c previous rx for #90  . predniSONE (DELTASONE) 10 MG tablet    Sig: 3 po qd for 3 days then 2 po qd for 3 days the 1 po qd for 3 days    Dispense:  18 tablet    Refill:  0  . amoxicillin-clavulanate (AUGMENTIN) 875-125 MG tablet    Sig: Take 1 tablet by mouth 2 (two) times daily.    Dispense:  20 tablet    Refill:  0    Problem List Items Addressed This Visit    None    Visit Diagnoses    Essential hypertension    -  Primary    Relevant Medications    amLODipine (NORVASC) 5 MG tablet    Seasonal allergies        Relevant Medications    loratadine (CLARITIN) 10 MG tablet    Acute frontal sinusitis, recurrence not specified        Relevant Medications    loratadine (CLARITIN) 10 MG tablet    predniSONE (DELTASONE) 10 MG tablet    amoxicillin-clavulanate (AUGMENTIN) 875-125 MG tablet    Acute bronchitis, unspecified organism        Relevant Medications    predniSONE (DELTASONE) 10 MG tablet    amoxicillin-clavulanate (AUGMENTIN) 875-125 MG tablet       Follow-up: Return if symptoms worsen or fail to improve.  Garnet Koyanagi, DO

## 2015-10-14 ENCOUNTER — Encounter: Payer: Self-pay | Admitting: Family Medicine

## 2015-10-19 ENCOUNTER — Encounter: Payer: Self-pay | Admitting: Family Medicine

## 2015-10-20 MED ORDER — BUPROPION HCL ER (XL) 150 MG PO TB24: 150.0000 mg | ORAL_TABLET | Freq: Every morning | ORAL | Status: DC

## 2015-10-20 NOTE — Telephone Encounter (Signed)
Duplicate request. See other my-chart message.   KP

## 2015-10-27 ENCOUNTER — Encounter: Payer: Self-pay | Admitting: Family Medicine

## 2015-10-27 NOTE — Telephone Encounter (Signed)
Apt scheduled for tomorrow at 3:30 and the patient is aware and has agreed to the apt.      KP

## 2015-10-28 ENCOUNTER — Ambulatory Visit (INDEPENDENT_AMBULATORY_CARE_PROVIDER_SITE_OTHER): Payer: Commercial Managed Care - HMO | Admitting: Family Medicine

## 2015-10-28 ENCOUNTER — Encounter: Payer: Self-pay | Admitting: Family Medicine

## 2015-10-28 VITALS — BP 140/90 | HR 74 | Temp 98.0°F | Ht 61.5 in | Wt 122.2 lb

## 2015-10-28 DIAGNOSIS — Z0000 Encounter for general adult medical examination without abnormal findings: Secondary | ICD-10-CM | POA: Diagnosis not present

## 2015-10-28 DIAGNOSIS — I10 Essential (primary) hypertension: Secondary | ICD-10-CM

## 2015-10-28 MED ORDER — HYDROCHLOROTHIAZIDE 25 MG PO TABS: 25.0000 mg | ORAL_TABLET | Freq: Every day | ORAL | Status: DC

## 2015-10-28 NOTE — Patient Instructions (Signed)
Preventive Care for Adults, Female A healthy lifestyle and preventive care can promote health and wellness. Preventive health guidelines for women include the following key practices.  A routine yearly physical is a good way to check with your health care provider about your health and preventive screening. It is a chance to share any concerns and updates on your health and to receive a thorough exam.  Visit your dentist for a routine exam and preventive care every 6 months. Brush your teeth twice a day and floss once a day. Good oral hygiene prevents tooth decay and gum disease.  The frequency of eye exams is based on your age, health, family medical history, use of contact lenses, and other factors. Follow your health care provider's recommendations for frequency of eye exams.  Eat a healthy diet. Foods like vegetables, fruits, whole grains, low-fat dairy products, and lean protein foods contain the nutrients you need without too many calories. Decrease your intake of foods high in solid fats, added sugars, and salt. Eat the right amount of calories for you.Get information about a proper diet from your health care provider, if necessary.  Regular physical exercise is one of the most important things you can do for your health. Most adults should get at least 150 minutes of moderate-intensity exercise (any activity that increases your heart rate and causes you to sweat) each week. In addition, most adults need muscle-strengthening exercises on 2 or more days a week.  Maintain a healthy weight. The body mass index (BMI) is a screening tool to identify possible weight problems. It provides an estimate of body fat based on height and weight. Your health care provider can find your BMI and can help you achieve or maintain a healthy weight.For adults 20 years and older:  A BMI below 18.5 is considered underweight.  A BMI of 18.5 to 24.9 is normal.  A BMI of 25 to 29.9 is considered overweight.  A  BMI of 30 and above is considered obese.  Maintain normal blood lipids and cholesterol levels by exercising and minimizing your intake of saturated fat. Eat a balanced diet with plenty of fruit and vegetables. Blood tests for lipids and cholesterol should begin at age 45 and be repeated every 5 years. If your lipid or cholesterol levels are high, you are over 50, or you are at high risk for heart disease, you may need your cholesterol levels checked more frequently.Ongoing high lipid and cholesterol levels should be treated with medicines if diet and exercise are not working.  If you smoke, find out from your health care provider how to quit. If you do not use tobacco, do not start.  Lung cancer screening is recommended for adults aged 45-80 years who are at high risk for developing lung cancer because of a history of smoking. A yearly low-dose CT scan of the lungs is recommended for people who have at least a 30-pack-year history of smoking and are a current smoker or have quit within the past 15 years. A pack year of smoking is smoking an average of 1 pack of cigarettes a day for 1 year (for example: 1 pack a day for 30 years or 2 packs a day for 15 years). Yearly screening should continue until the smoker has stopped smoking for at least 15 years. Yearly screening should be stopped for people who develop a health problem that would prevent them from having lung cancer treatment.  If you are pregnant, do not drink alcohol. If you are  breastfeeding, be very cautious about drinking alcohol. If you are not pregnant and choose to drink alcohol, do not have more than 1 drink per day. One drink is considered to be 12 ounces (355 mL) of beer, 5 ounces (148 mL) of wine, or 1.5 ounces (44 mL) of liquor.  Avoid use of street drugs. Do not share needles with anyone. Ask for help if you need support or instructions about stopping the use of drugs.  High blood pressure causes heart disease and increases the risk  of stroke. Your blood pressure should be checked at least every 1 to 2 years. Ongoing high blood pressure should be treated with medicines if weight loss and exercise do not work.  If you are 55-79 years old, ask your health care provider if you should take aspirin to prevent strokes.  Diabetes screening is done by taking a blood sample to check your blood glucose level after you have not eaten for a certain period of time (fasting). If you are not overweight and you do not have risk factors for diabetes, you should be screened once every 3 years starting at age 45. If you are overweight or obese and you are 40-70 years of age, you should be screened for diabetes every year as part of your cardiovascular risk assessment.  Breast cancer screening is essential preventive care for women. You should practice "breast self-awareness." This means understanding the normal appearance and feel of your breasts and may include breast self-examination. Any changes detected, no matter how small, should be reported to a health care provider. Women in their 20s and 30s should have a clinical breast exam (CBE) by a health care provider as part of a regular health exam every 1 to 3 years. After age 40, women should have a CBE every year. Starting at age 40, women should consider having a mammogram (breast X-ray test) every year. Women who have a family history of breast cancer should talk to their health care provider about genetic screening. Women at a high risk of breast cancer should talk to their health care providers about having an MRI and a mammogram every year.  Breast cancer gene (BRCA)-related cancer risk assessment is recommended for women who have family members with BRCA-related cancers. BRCA-related cancers include breast, ovarian, tubal, and peritoneal cancers. Having family members with these cancers may be associated with an increased risk for harmful changes (mutations) in the breast cancer genes BRCA1 and  BRCA2. Results of the assessment will determine the need for genetic counseling and BRCA1 and BRCA2 testing.  Your health care provider may recommend that you be screened regularly for cancer of the pelvic organs (ovaries, uterus, and vagina). This screening involves a pelvic examination, including checking for microscopic changes to the surface of your cervix (Pap test). You may be encouraged to have this screening done every 3 years, beginning at age 21.  For women ages 30-65, health care providers may recommend pelvic exams and Pap testing every 3 years, or they may recommend the Pap and pelvic exam, combined with testing for human papilloma virus (HPV), every 5 years. Some types of HPV increase your risk of cervical cancer. Testing for HPV may also be done on women of any age with unclear Pap test results.  Other health care providers may not recommend any screening for nonpregnant women who are considered low risk for pelvic cancer and who do not have symptoms. Ask your health care provider if a screening pelvic exam is right for   you.  If you have had past treatment for cervical cancer or a condition that could lead to cancer, you need Pap tests and screening for cancer for at least 20 years after your treatment. If Pap tests have been discontinued, your risk factors (such as having a new sexual partner) need to be reassessed to determine if screening should resume. Some women have medical problems that increase the chance of getting cervical cancer. In these cases, your health care provider may recommend more frequent screening and Pap tests.  Colorectal cancer can be detected and often prevented. Most routine colorectal cancer screening begins at the age of 50 years and continues through age 75 years. However, your health care provider may recommend screening at an earlier age if you have risk factors for colon cancer. On a yearly basis, your health care provider may provide home test kits to check  for hidden blood in the stool. Use of a small camera at the end of a tube, to directly examine the colon (sigmoidoscopy or colonoscopy), can detect the earliest forms of colorectal cancer. Talk to your health care provider about this at age 50, when routine screening begins. Direct exam of the colon should be repeated every 5-10 years through age 75 years, unless early forms of precancerous polyps or small growths are found.  People who are at an increased risk for hepatitis B should be screened for this virus. You are considered at high risk for hepatitis B if:  You were born in a country where hepatitis B occurs often. Talk with your health care provider about which countries are considered high risk.  Your parents were born in a high-risk country and you have not received a shot to protect against hepatitis B (hepatitis B vaccine).  You have HIV or AIDS.  You use needles to inject street drugs.  You live with, or have sex with, someone who has hepatitis B.  You get hemodialysis treatment.  You take certain medicines for conditions like cancer, organ transplantation, and autoimmune conditions.  Hepatitis C blood testing is recommended for all people born from 1945 through 1965 and any individual with known risks for hepatitis C.  Practice safe sex. Use condoms and avoid high-risk sexual practices to reduce the spread of sexually transmitted infections (STIs). STIs include gonorrhea, chlamydia, syphilis, trichomonas, herpes, HPV, and human immunodeficiency virus (HIV). Herpes, HIV, and HPV are viral illnesses that have no cure. They can result in disability, cancer, and death.  You should be screened for sexually transmitted illnesses (STIs) including gonorrhea and chlamydia if:  You are sexually active and are younger than 24 years.  You are older than 24 years and your health care provider tells you that you are at risk for this type of infection.  Your sexual activity has changed  since you were last screened and you are at an increased risk for chlamydia or gonorrhea. Ask your health care provider if you are at risk.  If you are at risk of being infected with HIV, it is recommended that you take a prescription medicine daily to prevent HIV infection. This is called preexposure prophylaxis (PrEP). You are considered at risk if:  You are sexually active and do not regularly use condoms or know the HIV status of your partner(s).  You take drugs by injection.  You are sexually active with a partner who has HIV.  Talk with your health care provider about whether you are at high risk of being infected with HIV. If   you choose to begin PrEP, you should first be tested for HIV. You should then be tested every 3 months for as long as you are taking PrEP.  Osteoporosis is a disease in which the bones lose minerals and strength with aging. This can result in serious bone fractures or breaks. The risk of osteoporosis can be identified using a bone density scan. Women ages 67 years and over and women at risk for fractures or osteoporosis should discuss screening with their health care providers. Ask your health care provider whether you should take a calcium supplement or vitamin D to reduce the rate of osteoporosis.  Menopause can be associated with physical symptoms and risks. Hormone replacement therapy is available to decrease symptoms and risks. You should talk to your health care provider about whether hormone replacement therapy is right for you.  Use sunscreen. Apply sunscreen liberally and repeatedly throughout the day. You should seek shade when your shadow is shorter than you. Protect yourself by wearing long sleeves, pants, a wide-brimmed hat, and sunglasses year round, whenever you are outdoors.  Once a month, do a whole body skin exam, using a mirror to look at the skin on your back. Tell your health care provider of new moles, moles that have irregular borders, moles that  are larger than a pencil eraser, or moles that have changed in shape or color.  Stay current with required vaccines (immunizations).  Influenza vaccine. All adults should be immunized every year.  Tetanus, diphtheria, and acellular pertussis (Td, Tdap) vaccine. Pregnant women should receive 1 dose of Tdap vaccine during each pregnancy. The dose should be obtained regardless of the length of time since the last dose. Immunization is preferred during the 27th-36th week of gestation. An adult who has not previously received Tdap or who does not know her vaccine status should receive 1 dose of Tdap. This initial dose should be followed by tetanus and diphtheria toxoids (Td) booster doses every 10 years. Adults with an unknown or incomplete history of completing a 3-dose immunization series with Td-containing vaccines should begin or complete a primary immunization series including a Tdap dose. Adults should receive a Td booster every 10 years.  Varicella vaccine. An adult without evidence of immunity to varicella should receive 2 doses or a second dose if she has previously received 1 dose. Pregnant females who do not have evidence of immunity should receive the first dose after pregnancy. This first dose should be obtained before leaving the health care facility. The second dose should be obtained 4-8 weeks after the first dose.  Human papillomavirus (HPV) vaccine. Females aged 13-26 years who have not received the vaccine previously should obtain the 3-dose series. The vaccine is not recommended for use in pregnant females. However, pregnancy testing is not needed before receiving a dose. If a female is found to be pregnant after receiving a dose, no treatment is needed. In that case, the remaining doses should be delayed until after the pregnancy. Immunization is recommended for any person with an immunocompromised condition through the age of 61 years if she did not get any or all doses earlier. During the  3-dose series, the second dose should be obtained 4-8 weeks after the first dose. The third dose should be obtained 24 weeks after the first dose and 16 weeks after the second dose.  Zoster vaccine. One dose is recommended for adults aged 30 years or older unless certain conditions are present.  Measles, mumps, and rubella (MMR) vaccine. Adults born  before 1957 generally are considered immune to measles and mumps. Adults born in 1957 or later should have 1 or more doses of MMR vaccine unless there is a contraindication to the vaccine or there is laboratory evidence of immunity to each of the three diseases. A routine second dose of MMR vaccine should be obtained at least 28 days after the first dose for students attending postsecondary schools, health care workers, or international travelers. People who received inactivated measles vaccine or an unknown type of measles vaccine during 1963-1967 should receive 2 doses of MMR vaccine. People who received inactivated mumps vaccine or an unknown type of mumps vaccine before 1979 and are at high risk for mumps infection should consider immunization with 2 doses of MMR vaccine. For females of childbearing age, rubella immunity should be determined. If there is no evidence of immunity, females who are not pregnant should be vaccinated. If there is no evidence of immunity, females who are pregnant should delay immunization until after pregnancy. Unvaccinated health care workers born before 1957 who lack laboratory evidence of measles, mumps, or rubella immunity or laboratory confirmation of disease should consider measles and mumps immunization with 2 doses of MMR vaccine or rubella immunization with 1 dose of MMR vaccine.  Pneumococcal 13-valent conjugate (PCV13) vaccine. When indicated, a person who is uncertain of his immunization history and has no record of immunization should receive the PCV13 vaccine. All adults 65 years of age and older should receive this  vaccine. An adult aged 19 years or older who has certain medical conditions and has not been previously immunized should receive 1 dose of PCV13 vaccine. This PCV13 should be followed with a dose of pneumococcal polysaccharide (PPSV23) vaccine. Adults who are at high risk for pneumococcal disease should obtain the PPSV23 vaccine at least 8 weeks after the dose of PCV13 vaccine. Adults older than 68 years of age who have normal immune system function should obtain the PPSV23 vaccine dose at least 1 year after the dose of PCV13 vaccine.  Pneumococcal polysaccharide (PPSV23) vaccine. When PCV13 is also indicated, PCV13 should be obtained first. All adults aged 65 years and older should be immunized. An adult younger than age 65 years who has certain medical conditions should be immunized. Any person who resides in a nursing home or long-term care facility should be immunized. An adult smoker should be immunized. People with an immunocompromised condition and certain other conditions should receive both PCV13 and PPSV23 vaccines. People with human immunodeficiency virus (HIV) infection should be immunized as soon as possible after diagnosis. Immunization during chemotherapy or radiation therapy should be avoided. Routine use of PPSV23 vaccine is not recommended for American Indians, [place] Natives, or people younger than 65 years unless there are medical conditions that require PPSV23 vaccine. When indicated, people who have unknown immunization and have no record of immunization should receive PPSV23 vaccine. One-time revaccination 5 years after the first dose of PPSV23 is recommended for people aged 19-64 years who have chronic kidney failure, nephrotic syndrome, asplenia, or immunocompromised conditions. People who received 1-2 doses of PPSV23 before age 65 years should receive another dose of PPSV23 vaccine at age 65 years or later if at least 5 years have passed since the previous dose. Doses of PPSV23 are not  needed for people immunized with PPSV23 at or after age 65 years.  Meningococcal vaccine. Adults with asplenia or persistent complement component deficiencies should receive 2 doses of quadrivalent meningococcal conjugate (MenACWY-D) vaccine. The doses should be obtained   at least 2 months apart. Microbiologists working with certain meningococcal bacteria, [place] recruits, people at risk during an outbreak, and people who travel to or live in countries with a high rate of meningitis should be immunized. A first-year college student up through age 34 years who is living in a residence hall should receive a dose if she did not receive a dose on or after her 16th birthday. Adults who have certain high-risk conditions should receive one or more doses of vaccine.  Hepatitis A vaccine. Adults who wish to be protected from this disease, have certain high-risk conditions, work with hepatitis A-infected animals, work in hepatitis A research labs, or travel to or work in countries with a high rate of hepatitis A should be immunized. Adults who were previously unvaccinated and who anticipate close contact with an international adoptee during the first 60 days after arrival in the [place] from a country with a high rate of hepatitis A should be immunized.  Hepatitis B vaccine. Adults who wish to be protected from this disease, have certain high-risk conditions, may be exposed to blood or other infectious body fluids, are household contacts or sex partners of hepatitis B positive people, are clients or workers in certain care facilities, or travel to or work in countries with a high rate of hepatitis B should be immunized.  Haemophilus influenzae type b (Hib) vaccine. A previously unvaccinated person with asplenia or sickle cell disease or having a scheduled splenectomy should receive 1 dose of Hib vaccine. Regardless of previous immunization, a recipient of a hematopoietic stem cell transplant should receive a  3-dose series 6-12 months after her successful transplant. Hib vaccine is not recommended for adults with HIV infection. Preventive Services / Frequency Ages 35 to 4 years  Blood pressure check.** / Every 3-5 years.  Lipid and cholesterol check.** / Every 5 years beginning at age 60.  Clinical breast exam.** / Every 3 years for women in their 71s and 10s.  BRCA-related cancer risk assessment.** / For women who have family members with a BRCA-related cancer (breast, ovarian, tubal, or peritoneal cancers).  Pap test.** / Every 2 years from ages 76 through 26. Every 3 years starting at age 61 through age 76 or 93 with a history of 3 consecutive normal Pap tests.  HPV screening.** / Every 3 years from ages 37 through ages 60 to 51 with a history of 3 consecutive normal Pap tests.  Hepatitis C blood test.** / For any individual with known risks for hepatitis C.  Skin self-exam. / Monthly.  Influenza vaccine. / Every year.  Tetanus, diphtheria, and acellular pertussis (Tdap, Td) vaccine.** / Consult your health care provider. Pregnant women should receive 1 dose of Tdap vaccine during each pregnancy. 1 dose of Td every 10 years.  Varicella vaccine.** / Consult your health care provider. Pregnant females who do not have evidence of immunity should receive the first dose after pregnancy.  HPV vaccine. / 3 doses over 6 months, if 93 and younger. The vaccine is not recommended for use in pregnant females. However, pregnancy testing is not needed before receiving a dose.  Measles, mumps, rubella (MMR) vaccine.** / You need at least 1 dose of MMR if you were born in 1957 or later. You may also need a 2nd dose. For females of childbearing age, rubella immunity should be determined. If there is no evidence of immunity, females who are not pregnant should be vaccinated. If there is no evidence of immunity, females who are  pregnant should delay immunization until after pregnancy.  Pneumococcal  13-valent conjugate (PCV13) vaccine.** / Consult your health care provider.  Pneumococcal polysaccharide (PPSV23) vaccine.** / 1 to 2 doses if you smoke cigarettes or if you have certain conditions.  Meningococcal vaccine.** / 1 dose if you are age 68 to 8 years and a Market researcher living in a residence hall, or have one of several medical conditions, you need to get vaccinated against meningococcal disease. You may also need additional booster doses.  Hepatitis A vaccine.** / Consult your health care provider.  Hepatitis B vaccine.** / Consult your health care provider.  Haemophilus influenzae type b (Hib) vaccine.** / Consult your health care provider. Ages 7 to 53 years  Blood pressure check.** / Every year.  Lipid and cholesterol check.** / Every 5 years beginning at age 25 years.  Lung cancer screening. / Every year if you are aged 11-80 years and have a 30-pack-year history of smoking and currently smoke or have quit within the past 15 years. Yearly screening is stopped once you have quit smoking for at least 15 years or develop a health problem that would prevent you from having lung cancer treatment.  Clinical breast exam.** / Every year after age 48 years.  BRCA-related cancer risk assessment.** / For women who have family members with a BRCA-related cancer (breast, ovarian, tubal, or peritoneal cancers).  Mammogram.** / Every year beginning at age 41 years and continuing for as long as you are in good health. Consult with your health care provider.  Pap test.** / Every 3 years starting at age 65 years through age 37 or 70 years with a history of 3 consecutive normal Pap tests.  HPV screening.** / Every 3 years from ages 72 years through ages 60 to 40 years with a history of 3 consecutive normal Pap tests.  Fecal occult blood test (FOBT) of stool. / Every year beginning at age 21 years and continuing until age 5 years. You may not need to do this test if you get  a colonoscopy every 10 years.  Flexible sigmoidoscopy or colonoscopy.** / Every 5 years for a flexible sigmoidoscopy or every 10 years for a colonoscopy beginning at age 35 years and continuing until age 48 years.  Hepatitis C blood test.** / For all people born from [birth year] through 1965 and any individual with known risks for hepatitis C.  Skin self-exam. / Monthly.  Influenza vaccine. / Every year.  Tetanus, diphtheria, and acellular pertussis (Tdap/Td) vaccine.** / Consult your health care provider. Pregnant women should receive 1 dose of Tdap vaccine during each pregnancy. 1 dose of Td every 10 years.  Varicella vaccine.** / Consult your health care provider. Pregnant females who do not have evidence of immunity should receive the first dose after pregnancy.  Zoster vaccine.** / 1 dose for adults aged 30 years or older.  Measles, mumps, rubella (MMR) vaccine.** / You need at least 1 dose of MMR if you were born in 1957 or later. You may also need a second dose. For females of childbearing age, rubella immunity should be determined. If there is no evidence of immunity, females who are not pregnant should be vaccinated. If there is no evidence of immunity, females who are pregnant should delay immunization until after pregnancy.  Pneumococcal 13-valent conjugate (PCV13) vaccine.** / Consult your health care provider.  Pneumococcal polysaccharide (PPSV23) vaccine.** / 1 to 2 doses if you smoke cigarettes or if you have certain conditions.  Meningococcal vaccine.** /  Consult your health care provider.  Hepatitis A vaccine.** / Consult your health care provider.  Hepatitis B vaccine.** / Consult your health care provider.  Haemophilus influenzae type b (Hib) vaccine.** / Consult your health care provider. Ages 64 years and over  Blood pressure check.** / Every year.  Lipid and cholesterol check.** / Every 5 years beginning at age 23 years.  Lung cancer screening. / Every year if you  are aged 16-80 years and have a 30-pack-year history of smoking and currently smoke or have quit within the past 15 years. Yearly screening is stopped once you have quit smoking for at least 15 years or develop a health problem that would prevent you from having lung cancer treatment.  Clinical breast exam.** / Every year after age 74 years.  BRCA-related cancer risk assessment.** / For women who have family members with a BRCA-related cancer (breast, ovarian, tubal, or peritoneal cancers).  Mammogram.** / Every year beginning at age 44 years and continuing for as long as you are in good health. Consult with your health care provider.  Pap test.** / Every 3 years starting at age 58 years through age 22 or 39 years with 3 consecutive normal Pap tests. Testing can be stopped between 65 and 70 years with 3 consecutive normal Pap tests and no abnormal Pap or HPV tests in the past 10 years.  HPV screening.** / Every 3 years from ages 64 years through ages 70 or 61 years with a history of 3 consecutive normal Pap tests. Testing can be stopped between 65 and 70 years with 3 consecutive normal Pap tests and no abnormal Pap or HPV tests in the past 10 years.  Fecal occult blood test (FOBT) of stool. / Every year beginning at age 40 years and continuing until age 27 years. You may not need to do this test if you get a colonoscopy every 10 years.  Flexible sigmoidoscopy or colonoscopy.** / Every 5 years for a flexible sigmoidoscopy or every 10 years for a colonoscopy beginning at age 7 years and continuing until age 32 years.  Hepatitis C blood test.** / For all people born from [birth year] through 1964 and any individual with known risks for hepatitis C.  Osteoporosis screening.** / A one-time screening for women ages 30 years and over and women at risk for fractures or osteoporosis.  Skin self-exam. / Monthly.  Influenza vaccine. / Every year.  Tetanus, diphtheria, and acellular pertussis (Tdap/Td)  vaccine.** / 1 dose of Td every 10 years.  Varicella vaccine.** / Consult your health care provider.  Zoster vaccine.** / 1 dose for adults aged 35 years or older.  Pneumococcal 13-valent conjugate (PCV13) vaccine.** / Consult your health care provider.  Pneumococcal polysaccharide (PPSV23) vaccine.** / 1 dose for all adults aged 46 years and older.  Meningococcal vaccine.** / Consult your health care provider.  Hepatitis A vaccine.** / Consult your health care provider.  Hepatitis B vaccine.** / Consult your health care provider.  Haemophilus influenzae type b (Hib) vaccine.** / Consult your health care provider. ** Family history and personal history of risk and conditions may change your health care provider's recommendations.   This information is not intended to replace advice given to you by your health care provider. Make sure you discuss any questions you have with your health care provider.   Document Released: 12/21/2001 Document Revised: 11/15/2014 Document Reviewed: 03/22/2011 Elsevier Interactive Patient Education [employer].

## 2015-10-28 NOTE — Progress Notes (Signed)
Pre visit review using our clinic review tool, if applicable. No additional management support is needed unless otherwise documented below in the visit note. 

## 2015-10-29 LAB — LIPID PANEL
CHOL/HDL RATIO: 2
Cholesterol: 163 mg/dL (ref 0–200)
HDL: 89 mg/dL (ref >39.00)
LDL CALC: 66 mg/dL (ref 0–99)
NONHDL: 74.22
Triglycerides: 39 mg/dL (ref 0.0–149.0)
VLDL: 7.8 mg/dL (ref 0.0–40.0)

## 2015-10-29 LAB — CBC WITH DIFFERENTIAL/PLATELET
BASOS PCT: 1.6 % (ref 0.0–3.0)
Basophils Absolute: 0.1 10*3/uL (ref 0.0–0.1)
EOS PCT: 7.2 % — AB (ref 0.0–5.0)
Eosinophils Absolute: 0.5 10*3/uL (ref 0.0–0.7)
HEMATOCRIT: 37.3 % (ref 36.0–46.0)
HEMOGLOBIN: 12.3 g/dL (ref 12.0–15.0)
LYMPHS PCT: 26.8 % (ref 12.0–46.0)
Lymphs Abs: 1.7 10*3/uL (ref 0.7–4.0)
MCHC: 32.9 g/dL (ref 30.0–36.0)
MCV: 95.1 fl (ref 78.0–100.0)
MONOS PCT: 7.3 % (ref 3.0–12.0)
Monocytes Absolute: 0.5 10*3/uL (ref 0.1–1.0)
NEUTROS ABS: 3.7 10*3/uL (ref 1.4–7.7)
Neutrophils Relative %: 57.1 % (ref 43.0–77.0)
PLATELETS: 278 10*3/uL (ref 150.0–400.0)
RBC: 3.92 Mil/uL (ref 3.87–5.11)
RDW: 14.6 % (ref 11.5–15.5)
WBC: 6.4 10*3/uL (ref 4.0–10.5)

## 2015-10-29 LAB — COMPREHENSIVE METABOLIC PANEL
ALT: 20 U/L (ref 0–35)
AST: 24 U/L (ref 0–37)
Albumin: 4.5 g/dL (ref 3.5–5.2)
Alkaline Phosphatase: 64 U/L (ref 39–117)
BUN: 9 mg/dL (ref 6–23)
CALCIUM: 9.7 mg/dL (ref 8.4–10.5)
CO2: 29 meq/L (ref 19–32)
CREATININE: 0.63 mg/dL (ref 0.40–1.20)
Chloride: 102 mEq/L (ref 96–112)
GFR: 99.79 mL/min (ref >60.00)
GLUCOSE: 83 mg/dL (ref 70–99)
Potassium: 3.7 mEq/L (ref 3.5–5.1)
Sodium: 140 mEq/L (ref 135–145)
Total Bilirubin: 0.4 mg/dL (ref 0.2–1.2)
Total Protein: 7 g/dL (ref 6.0–8.3)

## 2015-10-29 LAB — TSH: TSH: 2.04 u[IU]/mL (ref 0.35–4.50)

## 2015-10-30 NOTE — Progress Notes (Signed)
Subjective:   Natasha Trujillo is a 69 y.o. female who presents for Medicare Annual (Subsequent) preventive examination.  Review of Systems:  Review of Systems  Constitutional: Negative for activity change, appetite change and fatigue.  HENT: Negative for hearing loss, congestion, tinnitus and ear discharge.  dentist q45mEyes: Negative for visual disturbance (see optho q1y -- vision corrected to 20/20 with glasses).  Respiratory: Negative for cough, chest tightness and shortness of breath.   Cardiovascular: Negative for chest pain, palpitations and leg swelling.  Gastrointestinal: Negative for abdominal pain, diarrhea, constipation and abdominal distention.  Genitourinary: Negative for urgency, frequency, decreased urine volume and difficulty urinating.  Musculoskeletal: Negative for back pain, arthralgias and gait problem.  Skin: Negative for color change, pallor and rash.  Neurological: Negative for dizziness, light-headedness, numbness and headaches.  Hematological: Negative for adenopathy. Does not bruise/bleed easily.  Psychiatric/Behavioral: Negative for suicidal ideas, confusion, sleep disturbance, self-injury, dysphoric mood, decreased concentration and agitation.             Objective:     Vitals: BP 140/90 mmHg  Pulse 74  Temp(Src) 98 F (36.7 C) (Oral)  Ht 5' 1.5" (1.562 m)  Wt 122 lb 3.2 oz (55.43 kg)  BMI 22.72 kg/m2  SpO2 97%  Tobacco History  Smoking status  . Never Smoker   Smokeless tobacco  . Never Used     Counseling given: Not Answered   Past Medical History  Diagnosis Date  . Hyperlipidemia   . Hypertension   . Osteopenia    Past Surgical History  Procedure Laterality Date  . Tubal ligation    . Knee surgery Left 09/12/13   Family History  Problem Relation Age of Onset  . Coronary artery disease      female 1st degree relative <50/female 1st degree relative <60  . Cervical cancer    . Lung cancer    . Dementia Mother   .  Hypertension Mother   . Heart disease Mother 665   CABG  . Hypertension Father   . Heart disease Father 632   MI  . Diabetes Brother 16    type 1  . Hypertension Brother   . Heart disease Brother     cabg  . Cancer Maternal Aunt 68    cervical  . Diabetes Other     committed suicide  . Colon cancer Neg Hx    History  Sexual Activity  . Sexual Activity:  . Partners: Male    Outpatient Encounter Prescriptions as of 10/28/2015  Medication Sig  . aspirin 81 MG EC tablet Take 81 mg by mouth.    .Marland Kitchenatorvastatin (LIPITOR) 20 MG tablet TAKE 1 TABLET EVERY DAY  . buPROPion (WELLBUTRIN XL) 150 MG 24 hr tablet Take 1 tablet (150 mg total) by mouth every morning.  . Calcium 1500 MG tablet Take 1,500 mg by mouth.    . cholecalciferol (VITAMIN D) 1000 UNITS tablet Take 1,000 Units by mouth.    . Diclofenac Sodium (PENNSAID TD) Place 1 application onto the skin daily.  .Marland Kitchenloratadine (CLARITIN) 10 MG tablet Take 1 tablet (10 mg total) by mouth daily.  . Multiple Vitamins-Minerals (MULTIVITAMIN PO) Take 1 tablet by mouth daily.  . Omega-3 Fatty Acids (FISH OIL) 1000 MG CAPS Take 1 capsule by mouth daily.    . vitamin E 400 UNIT capsule Take 400 Units by mouth.    . [DISCONTINUED] amLODipine (NORVASC) 5 MG tablet Take 1 tablet (5 mg  total) by mouth daily.  . hydrochlorothiazide (HYDRODIURIL) 25 MG tablet Take 1 tablet (25 mg total) by mouth daily. 1 po qd prn  . [DISCONTINUED] amoxicillin-clavulanate (AUGMENTIN) 875-125 MG tablet Take 1 tablet by mouth 2 (two) times daily.  . [DISCONTINUED] diclofenac (VOLTAREN) 50 MG EC tablet Take 50 mg by mouth 2 (two) times daily.  . [DISCONTINUED] meloxicam (MOBIC) 15 MG tablet Take 1 tablet by mouth daily.  . [DISCONTINUED] predniSONE (DELTASONE) 10 MG tablet 3 po qd for 3 days then 2 po qd for 3 days the 1 po qd for 3 days   No facility-administered encounter medications on file as of 10/28/2015.    Activities of Daily Living In your present state of  health, do you have any difficulty performing the following activities: 10/28/2015 02/03/2015  Hearing? N N  Vision? N N  Difficulty concentrating or making decisions? N Y  Walking or climbing stairs? N N  Dressing or bathing? N N  Doing errands, shopping? N N    Patient Care Team: Rosalita Chessman, DO as PCP - General Harriett Sine, MD as Consulting Physician (Dermatology) Dian Queen, MD as Consulting Physician (Obstetrics and Gynecology)    Assessment:    CPE Exercise Activities and Dietary recommendations--- con't diet and exercise    Goals    None     Fall Risk Fall Risk  10/28/2015 09/19/2015 06/20/2014 05/10/2013  Falls in the past year? No No No No   Depression Screen PHQ 2/9 Scores 10/28/2015 09/19/2015 06/20/2014 05/10/2013  PHQ - 2 Score 0 0 0 0     Cognitive Testing mmse 30/30  Immunization History  Administered Date(s) Administered  . Influenza Whole 07/26/2008, 08/14/2008, 09/22/2009, 07/24/2010  . Influenza, High Dose Seasonal PF 09/09/2015  . Influenza,inj,Quad PF,36+ Mos 08/15/2013, 08/30/2014  . Pneumococcal Conjugate-13 06/20/2014  . Pneumococcal Polysaccharide-23 05/10/2013  . Td 09/22/2005, 07/26/2008  . Zoster 06/04/2008   Screening Tests Health Maintenance  Topic Date Due  . MAMMOGRAM  12/14/2015  . INFLUENZA VACCINE  06/08/2016  . TETANUS/TDAP  07/26/2018  . COLONOSCOPY  08/21/2018  . DEXA SCAN  Completed  . ZOSTAVAX  Completed  . Hepatitis C Screening  Completed  . PNA vac Low Risk Adult  Completed      Plan:    see AVS During the course of the visit the patient was educated and counseled about the following appropriate screening and preventive services:   Vaccines to include Pneumoccal, Influenza, Hepatitis B, Td, Zostavax, HCV  Electrocardiogram  Cardiovascular Disease  Colorectal cancer screening  Bone density screening  Diabetes screening  Glaucoma screening  Mammography/PAP  Nutrition counseling   Patient  Instructions (the written plan) was given to the patient.  1. Essential hypertension stable - hydrochlorothiazide (HYDRODIURIL) 25 MG tablet; Take 1 tablet (25 mg total) by mouth daily. 1 po qd prn  Dispense: 30 tablet; Refill: 11 - Comp Met (CMET) - CBC with Differential/Platelet - Lipid panel - POCT urinalysis dipstick - TSH - Comp Met (CMET)  2. Preventative health care  - Comp Met (CMET) - CBC with Differential/Platelet - Lipid panel - POCT urinalysis dipstick - TSH - Comp Met (CMET)  Garnet Koyanagi, DO  10/30/2015

## 2015-10-31 ENCOUNTER — Encounter: Payer: Commercial Managed Care - HMO | Admitting: Family Medicine

## 2015-11-24 MED FILL — HYDROCHLOROTHIAZIDE 25 MG T: 25 | 30 days supply | Qty: 30 | Fill #1

## 2015-12-08 ENCOUNTER — Encounter: Payer: Self-pay | Admitting: Family Medicine

## 2015-12-08 DIAGNOSIS — I10 Essential (primary) hypertension: Secondary | ICD-10-CM

## 2015-12-09 MED ORDER — HYDROCHLOROTHIAZIDE 25 MG PO TABS: 25.0000 mg | ORAL_TABLET | Freq: Every day | ORAL | Status: DC

## 2015-12-09 MED ORDER — AMLODIPINE BESYLATE 5 MG PO TABS: 5.0000 mg | ORAL_TABLET | Freq: Every day | ORAL | Status: DC

## 2015-12-28 DIAGNOSIS — H43812 Vitreous degeneration, left eye: Secondary | ICD-10-CM | POA: Diagnosis not present

## 2015-12-28 DIAGNOSIS — H25813 Combined forms of age-related cataract, bilateral: Secondary | ICD-10-CM | POA: Diagnosis not present

## 2016-01-14 ENCOUNTER — Encounter: Payer: Self-pay | Admitting: Family Medicine

## 2016-01-23 MED FILL — LORATADINE 10 MG TABLET: 10 | 100 days supply | Qty: 100 | Fill #1

## 2016-01-25 ENCOUNTER — Emergency Department (HOSPITAL_BASED_OUTPATIENT_CLINIC_OR_DEPARTMENT_OTHER)
Admission: EM | Admit: 2016-01-25 | Discharge: 2016-01-25 | Disposition: A | Discharge status: Home / Self Care | Payer: Commercial Managed Care - HMO | Attending: Emergency Medicine | Admitting: Emergency Medicine

## 2016-01-25 ENCOUNTER — Encounter (HOSPITAL_BASED_OUTPATIENT_CLINIC_OR_DEPARTMENT_OTHER): Payer: Self-pay

## 2016-01-25 DIAGNOSIS — Z7982 Long term (current) use of aspirin: Secondary | ICD-10-CM | POA: Insufficient documentation

## 2016-01-25 DIAGNOSIS — M8580 Other specified disorders of bone density and structure, unspecified site: Secondary | ICD-10-CM | POA: Insufficient documentation

## 2016-01-25 DIAGNOSIS — I10 Essential (primary) hypertension: Secondary | ICD-10-CM | POA: Diagnosis not present

## 2016-01-25 DIAGNOSIS — H43392 Other vitreous opacities, left eye: Secondary | ICD-10-CM | POA: Insufficient documentation

## 2016-01-25 DIAGNOSIS — Z79899 Other long term (current) drug therapy: Secondary | ICD-10-CM | POA: Diagnosis not present

## 2016-01-25 DIAGNOSIS — H538 Other visual disturbances: Secondary | ICD-10-CM | POA: Diagnosis present

## 2016-01-25 DIAGNOSIS — E785 Hyperlipidemia, unspecified: Secondary | ICD-10-CM | POA: Insufficient documentation

## 2016-01-25 NOTE — ED Notes (Signed)
Patient here with gold streaks and black floaters in left eye since last night at 2100. Denies pain, reports black floaters on arrival

## 2016-01-25 NOTE — ED Provider Notes (Signed)
CSN: [id number]     Arrival date & time 01/25/16  [id number] History   First MD Initiated Contact with Patient 01/25/16 1016     Chief Complaint  Patient presents with  . vision disturbance      (Consider location/radiation/quality/duration/timing/severity/associated sxs/prior Treatment) HPI  70 year old female presents with left eye floaters. Last night had some gold/black half ring sensation/floater on her left eye. This occurred around 9 PM. Whenever she moved her head she would seem to get this sensation. This morning when she woke up around 8 AM she had an irregularly-shaped, slightly moving black floater in her left eye. Right eye is unaffected. Closing her left eye completely resolves these symptoms. No eye pain. No headache, vomiting, blurry vision, or weakness/numbness. Wears reading glasses but otherwise no corrective lenses.  Past Medical History  Diagnosis Date  . Hyperlipidemia   . Hypertension   . Osteopenia    Past Surgical History  Procedure Laterality Date  . Tubal ligation    . Knee surgery Left 09/12/13   Family History  Problem Relation Age of Onset  . Coronary artery disease      female 1st degree relative <50/female 1st degree relative <60  . Cervical cancer    . Lung cancer    . Dementia Mother   . Hypertension Mother   . Heart disease Mother 78    CABG  . Hypertension Father   . Heart disease Father 82    MI  . Diabetes Brother 16    type 1  . Hypertension Brother   . Heart disease Brother     cabg  . Cancer Maternal Aunt 68    cervical  . Diabetes Other     committed suicide  . Colon cancer Neg Hx    Social History  Substance Use Topics  . Smoking status: Never Smoker   . Smokeless tobacco: Never Used  . Alcohol Use: Yes     Comment: occasional   OB History    No data available     Review of Systems  Constitutional: Negative for fever.  Eyes: Positive for visual disturbance. Negative for photophobia, pain and redness.  Respiratory:  Negative for shortness of breath.   Cardiovascular: Negative for chest pain.  Gastrointestinal: Negative for vomiting.  Musculoskeletal: Negative for neck pain.  Neurological: Negative for dizziness, weakness, numbness and headaches.  All other systems reviewed and are negative.     Allergies  Adrenalin  Home Medications   Prior to Admission medications   Medication Sig Start Date End Date Taking? Authorizing Provider  amLODipine (NORVASC) 5 MG tablet Take 1 tablet (5 mg total) by mouth daily. 12/09/15   Rosalita Chessman, DO  aspirin 81 MG EC tablet Take 81 mg by mouth.      Historical Provider, MD  atorvastatin (LIPITOR) 20 MG tablet TAKE 1 TABLET EVERY DAY 03/27/15   Rosalita Chessman, DO  buPROPion (WELLBUTRIN XL) 150 MG 24 hr tablet Take 1 tablet (150 mg total) by mouth every morning. 10/20/15   Rosalita Chessman, DO  Calcium 1500 MG tablet Take 1,500 mg by mouth.      Historical Provider, MD  cholecalciferol (VITAMIN D) 1000 UNITS tablet Take 1,000 Units by mouth.      Historical Provider, MD  Diclofenac Sodium (PENNSAID TD) Place 1 application onto the skin daily.    Historical Provider, MD  hydrochlorothiazide (HYDRODIURIL) 25 MG tablet Take 1 tablet (25 mg total) by mouth daily. 1 po qd prn  12/09/15   Rosalita Chessman, DO  loratadine (CLARITIN) 10 MG tablet Take 1 tablet (10 mg total) by mouth daily. 09/19/15   Rosalita Chessman, DO  Multiple Vitamins-Minerals (MULTIVITAMIN PO) Take 1 tablet by mouth daily. 03/21/14   Historical Provider, MD  Omega-3 Fatty Acids (FISH OIL) 1000 MG CAPS Take 1 capsule by mouth daily.      Historical Provider, MD  vitamin E 400 UNIT capsule Take 400 Units by mouth.      Historical Provider, MD   BP 166/93 mmHg  Pulse 78  Temp(Src) 98.1 F (36.7 C) (Oral)  Resp 18  Ht 5\' 2"  (1.575 m)  Wt 119 lb (53.978 kg)  BMI 21.76 kg/m2  SpO2 100% Physical Exam  Constitutional: She is oriented to person, place, and time. She appears well-developed and well-nourished.   HENT:  Head: Normocephalic and atraumatic.  Right Ear: External ear normal.  Left Ear: External ear normal.  Nose: Nose normal.  Eyes: Conjunctivae and EOM are normal. Pupils are equal, round, and reactive to light. Right eye exhibits no discharge. Left eye exhibits no discharge.  Neck: Neck supple.  Cardiovascular: Normal rate, regular rhythm and normal heart sounds.   Pulmonary/Chest: Effort normal and breath sounds normal.  Abdominal: Soft. There is no tenderness.  Neurological: She is alert and oriented to person, place, and time.  CN 2-12 grossly intact. 5/5 strength in all 4 extremities. Grossly normal sensation.   Skin: Skin is warm and dry.  Nursing note and vitals reviewed.   ED Course  Procedures (including critical care time) Labs Review Labs Reviewed - No data to display  Imaging Review No results found. I have personally reviewed and evaluated these images and lab results as part of my medical decision-making.   EKG Interpretation None      MDM   Final diagnoses:  Floaters, left    Likely this is coming from a retinal issue. I did a bedside ultrasound and could find no obvious or significant retinal detachment. I discussed with ophthalmology on-call, Dr. Alanda Slim, who feels she likely has a small retinal tear and can see patient today in his office. Will be discharged and go straight to his office. She is having no actual vision change except for the small floater. She feels like she can see well enough and is comfortable driving. No signs that this is a stroke.    Sherwood Gambler, MD 01/25/16 [phone]

## 2016-01-25 NOTE — Discharge Instructions (Signed)
Eye Floaters  Eye floaters are specks of material that float around inside your eye. A jelly-like fluid (vitreous) fills the inside of your eye. The vitreous is normally clear. It allows light to pass through to tissues at the back of the eye (retina). The retina contains the nerves needed for vision.   Your vitreous can start to shrink and become stringy as you age. Strands of material may start to float around inside the eye. They come from clumps of cells, blood, or other materials. These objects cast shadows on the retina and show up as floaters. Floaters may be more obvious when you look up at the sky or at a bright, blank background. They do not go away completely. In time, however, they may settle below your line of sight. Floaters can be annoying. They do not usually cause vision problems.  Sometimes floaters appear along with flashes. Flashes look like bright, quick streaks of light. They usually occur at the edge of your vision. Flashes result when your vitreous pulls on your retina. They also occur with age. However, they could be a warning sign of a detached retina. This is a serious condition that requires emergency treatment to prevent vision loss.  CAUSES   For most people, eye floaters develop when the vitreous begins to shrink as a normal part of aging. More serious causes of floaters include:   A torn retina.   Injury.    Bleeding inside the eye. Diabetes and other conditions can cause broken retinal blood vessels.   A blood clot in the major vein of the retina or its branches (retinal vein occlusion).   Retinal detachment.    Vitreous detachment.    Inflammation inside the eye (uveitis).    Infection inside the eye.  RISK FACTORS  You may have a higher risk for floaters if:    You are older.   You are nearsighted.   You have diabetes.   You have had cataracts removed.  SIGNS AND SYMPTOMS   Symptoms of floaters include seeing small, shadowy shapes move across your vision. They move  as your eyes move. They drift out of your vision when you keep your eyes still. These shapes may look like:   Specks.   Dots.   Circles.   Squiggly lines.   Thread.  Symptoms of flashes include seeing:   Bursts of light.   Flashing lights.   Lightning streaks.   What is commonly referred to as "stars."  DIAGNOSIS   Your health care provider may diagnose floaters and flashes based on your symptoms. You may need to see an eye care specialist (optometrist or ophthalmologist). The specialist will do an exam to determine whether your floaters are a normal part of aging or a warning sign of a more serious eye problem. The specialist may put drops in your eyes to open your pupils wide (dilate) and then use a special scope (slit lamp) to look inside your eye.   TREATMENT   No treatment is needed for floaters that occur normally with age. Sometimes floaters become severe enough to affect your vision. In rare cases, surgery to remove the vitreous and replace it with a saltwater solution (vitrectomy) may be considered.  HOME CARE INSTRUCTIONS  Keep all follow-up visits as directed by your health care provider. This is important.   SEEK MEDICAL CARE IF:    You have a sudden increase in floaters.   You have floaters along with flashes.   You have floaters   along with any new eye symptoms.  SEEK IMMEDIATE MEDICAL CARE IF:    You have a sudden increase in floaters or flashes that interferes with your vision.   Your vision suddenly changes.     This information is not intended to replace advice given to you by your health care provider. Make sure you discuss any questions you have with your health care provider.     Document Released: 10/28/2003 Document Revised: 11/15/2014 Document Reviewed: 06/19/2014  Elsevier Interactive Patient Education 2016 [employer]

## 2016-01-25 NOTE — ED Notes (Signed)
MD at bedside. 

## 2016-02-04 ENCOUNTER — Encounter: Payer: Self-pay | Admitting: Family Medicine

## 2016-02-04 DIAGNOSIS — H43393 Other vitreous opacities, bilateral: Secondary | ICD-10-CM

## 2016-02-04 NOTE — Telephone Encounter (Signed)
Please advise if it is ok to do the referral.    KP

## 2016-02-04 NOTE — Telephone Encounter (Signed)
Ok to put referral in 

## 2016-02-26 ENCOUNTER — Other Ambulatory Visit: Payer: Self-pay | Admitting: Family Medicine

## 2016-03-02 DIAGNOSIS — H43812 Vitreous degeneration, left eye: Secondary | ICD-10-CM | POA: Diagnosis not present

## 2016-03-02 DIAGNOSIS — H25813 Combined forms of age-related cataract, bilateral: Secondary | ICD-10-CM | POA: Diagnosis not present

## 2016-03-09 ENCOUNTER — Encounter: Payer: Self-pay | Admitting: Family Medicine

## 2016-03-09 DIAGNOSIS — H029 Unspecified disorder of eyelid: Secondary | ICD-10-CM

## 2016-04-12 DIAGNOSIS — H02831 Dermatochalasis of right upper eyelid: Secondary | ICD-10-CM | POA: Diagnosis not present

## 2016-04-12 DIAGNOSIS — H02834 Dermatochalasis of left upper eyelid: Secondary | ICD-10-CM | POA: Diagnosis not present

## 2016-04-14 ENCOUNTER — Other Ambulatory Visit: Payer: Self-pay | Admitting: Family Medicine

## 2016-04-27 ENCOUNTER — Encounter: Payer: Self-pay | Admitting: Family Medicine

## 2016-04-27 ENCOUNTER — Ambulatory Visit (INDEPENDENT_AMBULATORY_CARE_PROVIDER_SITE_OTHER): Payer: Commercial Managed Care - HMO | Admitting: Family Medicine

## 2016-04-27 VITALS — BP 140/90 | HR 68 | Temp 98.3°F | Ht 62.0 in | Wt 118.4 lb

## 2016-04-27 DIAGNOSIS — R609 Edema, unspecified: Secondary | ICD-10-CM

## 2016-04-27 DIAGNOSIS — E785 Hyperlipidemia, unspecified: Secondary | ICD-10-CM

## 2016-04-27 DIAGNOSIS — I10 Essential (primary) hypertension: Secondary | ICD-10-CM

## 2016-04-27 LAB — LIPID PANEL
CHOL/HDL RATIO: 2
Cholesterol: 167 mg/dL (ref 0–200)
HDL: 73.1 mg/dL (ref >39.00)
LDL CALC: 81 mg/dL (ref 0–99)
NONHDL: 93.84
TRIGLYCERIDES: 62 mg/dL (ref 0.0–149.0)
VLDL: 12.4 mg/dL (ref 0.0–40.0)

## 2016-04-27 LAB — COMPREHENSIVE METABOLIC PANEL
ALT: 20 U/L (ref 0–35)
AST: 24 U/L (ref 0–37)
Albumin: 4.3 g/dL (ref 3.5–5.2)
Alkaline Phosphatase: 58 U/L (ref 39–117)
BUN: 9 mg/dL (ref 6–23)
CALCIUM: 9.3 mg/dL (ref 8.4–10.5)
CHLORIDE: 100 meq/L (ref 96–112)
CO2: 33 meq/L — AB (ref 19–32)
CREATININE: 0.55 mg/dL (ref 0.40–1.20)
GFR: 116.55 mL/min (ref >60.00)
GLUCOSE: 100 mg/dL — AB (ref 70–99)
Potassium: 3.7 mEq/L (ref 3.5–5.1)
Sodium: 138 mEq/L (ref 135–145)
Total Bilirubin: 0.4 mg/dL (ref 0.2–1.2)
Total Protein: 7 g/dL (ref 6.0–8.3)

## 2016-04-27 MED ORDER — FUROSEMIDE 20 MG PO TABS: 20.0000 mg | ORAL_TABLET | Freq: Every day | ORAL | Status: DC

## 2016-04-27 MED ORDER — VALSARTAN 160 MG PO TABS: 160.0000 mg | ORAL_TABLET | Freq: Every day | ORAL | Status: DC

## 2016-04-27 MED FILL — VALSARTAN 160 MG TABLET: 160 | 30 days supply | Qty: 30 | Fill #0

## 2016-04-27 NOTE — Progress Notes (Signed)
Patient ID: Natasha Trujillo, female    DOB: June 23, 1946  Age: 70 y.o. MRN: [id number]    Subjective:  Subjective HPI Natasha Trujillo presents for f/u bp.  Her ankles are swelling with norvasc .  No cp, no sob or palpitations.     Review of Systems  Constitutional: Negative for diaphoresis, appetite change, fatigue and unexpected weight change.  Eyes: Negative for pain, redness and visual disturbance.  Respiratory: Negative for cough, chest tightness, shortness of breath and wheezing.   Cardiovascular: Negative for chest pain, palpitations and leg swelling.  Endocrine: Negative for cold intolerance, heat intolerance, polydipsia, polyphagia and polyuria.  Genitourinary: Negative for dysuria, frequency and difficulty urinating.  Neurological: Negative for dizziness, light-headedness, numbness and headaches.    History Past Medical History  Diagnosis Date  . Hyperlipidemia   . Hypertension   . Osteopenia     She has past surgical history that includes Tubal ligation and Knee surgery (Left, 09/12/13).   Her family history includes Cancer (age of onset: 9) in her maternal aunt; Dementia in her mother; Diabetes in her other; Diabetes (age of onset: 40) in her brother; Heart disease in her brother; Heart disease (age of onset: 79) in her father and mother; Hypertension in her brother, father, and mother. There is no history of Colon cancer.She reports that she has never smoked. She has never used smokeless tobacco. She reports that she drinks alcohol. She reports that she does not use illicit drugs.  Current Outpatient Prescriptions on File Prior to Visit  Medication Sig Dispense Refill  . aspirin 81 MG EC tablet Take 81 mg by mouth.      Marland Kitchen atorvastatin (LIPITOR) 20 MG tablet Take 1 tablet (20 mg total) by mouth daily. 90 tablet 0  . buPROPion (WELLBUTRIN XL) 150 MG 24 hr tablet TAKE 1 TABLET (150 MG TOTAL) BY MOUTH EVERY MORNING. 90 tablet 1  . Calcium 1500 MG tablet Take 1,500 mg  by mouth.      . cholecalciferol (VITAMIN D) 1000 UNITS tablet Take 1,000 Units by mouth.      . Diclofenac Sodium (PENNSAID TD) Place 1 application onto the skin daily.    Marland Kitchen loratadine (CLARITIN) 10 MG tablet Take 1 tablet (10 mg total) by mouth daily. 90 tablet 3  . Multiple Vitamins-Minerals (MULTIVITAMIN PO) Take 1 tablet by mouth daily.    . Omega-3 Fatty Acids (FISH OIL) 1000 MG CAPS Take 1 capsule by mouth daily.      . vitamin E 400 UNIT capsule Take 400 Units by mouth.       No current facility-administered medications on file prior to visit.     Objective:  Objective Physical Exam  Constitutional: She is oriented to person, place, and time. She appears well-developed and well-nourished.  HENT:  Head: Normocephalic and atraumatic.  Eyes: Conjunctivae and EOM are normal.  Neck: Normal range of motion. Neck supple. No JVD present. Carotid bruit is not present. No thyromegaly present.  Cardiovascular: Normal rate, regular rhythm and normal heart sounds.   No murmur heard. Pulmonary/Chest: Effort normal and breath sounds normal. No respiratory distress. She has no wheezes. She has no rales. She exhibits no tenderness.  Musculoskeletal: She exhibits no edema.  Neurological: She is alert and oriented to person, place, and time.  Psychiatric: She has a normal mood and affect.  Nursing note and vitals reviewed.  BP 140/90 mmHg  Pulse 68  Temp(Src) 98.3 F (36.8 C) (Oral)  Ht 5\' 2"  (1.575  m)  Wt 118 lb 6.4 oz (53.706 kg)  BMI 21.65 kg/m2  SpO2 98% Wt Readings from Last 3 Encounters:  04/27/16 118 lb 6.4 oz (53.706 kg)  01/25/16 119 lb (53.978 kg)  10/28/15 122 lb 3.2 oz (55.43 kg)     Lab Results  Component Value Date   WBC 6.4 10/28/2015   HGB 12.3 10/28/2015   HCT 37.3 10/28/2015   PLT 278.0 10/28/2015   GLUCOSE 83 10/28/2015   CHOL 163 10/28/2015   TRIG 39.0 10/28/2015   HDL 89.00 10/28/2015   LDLCALC 66 10/28/2015   ALT 20 10/28/2015   AST 24 10/28/2015   NA  140 10/28/2015   K 3.7 10/28/2015   CL 102 10/28/2015   CREATININE 0.63 10/28/2015   BUN 9 10/28/2015   CO2 29 10/28/2015   TSH 2.04 10/28/2015   MICROALBUR 0.50 05/10/2013    No results found.   Assessment & Plan:  Plan I have discontinued Ms. Reigel's hydrochlorothiazide and amLODipine. I am also having her start on furosemide and valsartan. Additionally, I am having her maintain her aspirin, Calcium, Fish Oil, vitamin E, cholecalciferol, Multiple Vitamins-Minerals (MULTIVITAMIN PO), Diclofenac Sodium (PENNSAID TD), loratadine, atorvastatin, and buPROPion.  Meds ordered this encounter  Medications  . furosemide (LASIX) 20 MG tablet    Sig: Take 1 tablet (20 mg total) by mouth daily.    Dispense:  30 tablet    Refill:  3  . valsartan (DIOVAN) 160 MG tablet    Sig: Take 1 tablet (160 mg total) by mouth daily.    Dispense:  30 tablet    Refill:  2    Problem List Items Addressed This Visit    None    Visit Diagnoses    Essential hypertension    -  Primary    Relevant Medications    furosemide (LASIX) 20 MG tablet    valsartan (DIOVAN) 160 MG tablet    Other Relevant Orders    Comprehensive metabolic panel    Edema, unspecified type        Relevant Medications    furosemide (LASIX) 20 MG tablet    Hyperlipidemia        Relevant Medications    furosemide (LASIX) 20 MG tablet    valsartan (DIOVAN) 160 MG tablet    Other Relevant Orders    Lipid panel     elevate legs , drink water F/u 3 weeks or sooner prn  Pt warned about side effects of diovan and understands   Follow-up: Return in about 3 weeks (around 05/18/2016) for hypertension.  Ann Held, DO

## 2016-04-27 NOTE — Progress Notes (Signed)
Pre visit review using our clinic tool,if applicable. No additional management support is needed unless otherwise documented below in the visit note.  

## 2016-04-27 NOTE — Patient Instructions (Signed)
Hypertension Hypertension, commonly called high blood pressure, is when the force of blood pumping through your arteries is too strong. Your arteries are the blood vessels that carry blood from your heart throughout your body. A blood pressure reading consists of a higher number over a lower number, such as 110/72. The higher number (systolic) is the pressure inside your arteries when your heart pumps. The lower number (diastolic) is the pressure inside your arteries when your heart relaxes. Ideally you want your blood pressure below 120/80. Hypertension forces your heart to work harder to pump blood. Your arteries may become narrow or stiff. Having untreated or uncontrolled hypertension can cause heart attack, stroke, kidney disease, and other problems. RISK FACTORS Some risk factors for high blood pressure are controllable. Others are not.  Risk factors you cannot control include:   Race. You may be at higher risk if you are African American.  Age. Risk increases with age.  Gender. Men are at higher risk than women before age 45 years. After age 65, women are at higher risk than men. Risk factors you can control include:  Not getting enough exercise or physical activity.  Being overweight.  Getting too much fat, sugar, calories, or salt in your diet.  Drinking too much alcohol. SIGNS AND SYMPTOMS Hypertension does not usually cause signs or symptoms. Extremely high blood pressure (hypertensive crisis) may cause headache, anxiety, shortness of breath, and nosebleed. DIAGNOSIS To check if you have hypertension, your health care provider will measure your blood pressure while you are seated, with your arm held at the level of your heart. It should be measured at least twice using the same arm. Certain conditions can cause a difference in blood pressure between your right and left arms. A blood pressure reading that is higher than normal on one occasion does not mean that you need treatment. If  it is not clear whether you have high blood pressure, you may be asked to return on a different day to have your blood pressure checked again. Or, you may be asked to monitor your blood pressure at home for 1 or more weeks. TREATMENT Treating high blood pressure includes making lifestyle changes and possibly taking medicine. Living a healthy lifestyle can help lower high blood pressure. You may need to change some of your habits. Lifestyle changes may include:  Following the DASH diet. This diet is high in fruits, vegetables, and whole grains. It is low in salt, red meat, and added sugars.  Keep your sodium intake below 2,300 mg per day.  Getting at least 30-45 minutes of aerobic exercise at least 4 times per week.  Losing weight if necessary.  Not smoking.  Limiting alcoholic beverages.  Learning ways to reduce stress. Your health care provider may prescribe medicine if lifestyle changes are not enough to get your blood pressure under control, and if one of the following is true:  You are 18-59 years of age and your systolic blood pressure is above 140.  You are 60 years of age or older, and your systolic blood pressure is above 150.  Your diastolic blood pressure is above 90.  You have diabetes, and your systolic blood pressure is over 140 or your diastolic blood pressure is over 90.  You have kidney disease and your blood pressure is above 140/90.  You have heart disease and your blood pressure is above 140/90. Your personal target blood pressure may vary depending on your medical conditions, your age, and other factors. HOME CARE INSTRUCTIONS    Have your blood pressure rechecked as directed by your health care provider.   Take medicines only as directed by your health care provider. Follow the directions carefully. Blood pressure medicines must be taken as prescribed. The medicine does not work as well when you skip doses. Skipping doses also puts you at risk for  problems.  Do not smoke.   Monitor your blood pressure at home as directed by your health care provider. SEEK MEDICAL CARE IF:   You think you are having a reaction to medicines taken.  You have recurrent headaches or feel dizzy.  You have swelling in your ankles.  You have trouble with your vision. SEEK IMMEDIATE MEDICAL CARE IF:  You develop a severe headache or confusion.  You have unusual weakness, numbness, or feel faint.  You have severe chest or abdominal pain.  You vomit repeatedly.  You have trouble breathing. MAKE SURE YOU:   Understand these instructions.  Will watch your condition.  Will get help right away if you are not doing well or get worse.   This information is not intended to replace advice given to you by your health care provider. Make sure you discuss any questions you have with your health care provider.   Document Released: 10/25/2005 Document Revised: 03/11/2015 Document Reviewed: 08/17/2013 Elsevier Interactive Patient Education 2016 [employer]  

## 2016-05-05 ENCOUNTER — Telehealth: Payer: Self-pay | Admitting: Family Medicine

## 2016-05-05 ENCOUNTER — Encounter: Payer: Self-pay | Admitting: Family Medicine

## 2016-05-05 ENCOUNTER — Ambulatory Visit (INDEPENDENT_AMBULATORY_CARE_PROVIDER_SITE_OTHER): Payer: Commercial Managed Care - HMO | Admitting: Family Medicine

## 2016-05-05 VITALS — BP 149/87 | HR 63 | Temp 97.7°F | Ht 62.0 in

## 2016-05-05 DIAGNOSIS — H8110 Benign paroxysmal vertigo, unspecified ear: Secondary | ICD-10-CM | POA: Diagnosis not present

## 2016-05-05 DIAGNOSIS — I10 Essential (primary) hypertension: Secondary | ICD-10-CM | POA: Diagnosis not present

## 2016-05-05 LAB — COMPREHENSIVE METABOLIC PANEL
ALBUMIN: 4.4 g/dL (ref 3.5–5.2)
ALK PHOS: 56 U/L (ref 39–117)
ALT: 15 U/L (ref 0–35)
AST: 17 U/L (ref 0–37)
BILIRUBIN TOTAL: 0.4 mg/dL (ref 0.2–1.2)
BUN: 13 mg/dL (ref 6–23)
CALCIUM: 9.3 mg/dL (ref 8.4–10.5)
CO2: 29 mEq/L (ref 19–32)
Chloride: 105 mEq/L (ref 96–112)
Creatinine, Ser: 0.51 mg/dL (ref 0.40–1.20)
GFR: 127.15 mL/min (ref >60.00)
GLUCOSE: 105 mg/dL — AB (ref 70–99)
Potassium: 4.4 mEq/L (ref 3.5–5.1)
Sodium: 138 mEq/L (ref 135–145)
TOTAL PROTEIN: 6.9 g/dL (ref 6.0–8.3)

## 2016-05-05 LAB — CBC
HCT: 37.5 % (ref 36.0–46.0)
HEMOGLOBIN: 12.4 g/dL (ref 12.0–15.0)
MCHC: 33.2 g/dL (ref 30.0–36.0)
MCV: 93.9 fl (ref 78.0–100.0)
Platelets: 276 10*3/uL (ref 150.0–400.0)
RBC: 3.99 Mil/uL (ref 3.87–5.11)
RDW: 14.2 % (ref 11.5–15.5)
WBC: 5.1 10*3/uL (ref 4.0–10.5)

## 2016-05-05 MED ORDER — ONDANSETRON HCL 4 MG PO TABS: 4.0000 mg | ORAL_TABLET | Freq: Three times a day (TID) | ORAL | Status: DC | PRN

## 2016-05-05 MED ORDER — MECLIZINE HCL 25 MG PO TABS: 25.0000 mg | ORAL_TABLET | Freq: Three times a day (TID) | ORAL | Status: DC | PRN

## 2016-05-05 MED FILL — MECLIZINE 25 MG TABLET: 25 | 10 days supply | Qty: 30 | Fill #0

## 2016-05-05 MED FILL — ONDANSETRON HCL 4 MG TABLET: 4 | 6 days supply | Qty: 20 | Fill #0

## 2016-05-05 NOTE — Telephone Encounter (Signed)
Pt seen in office today by Dr. Lorelei Pont.

## 2016-05-05 NOTE — Patient Instructions (Signed)
I think that your dizziness is caused by BPPV- this is a common and benign type of vertigo and generally will resolve in a few days Stop using the furosemide now, but do take your valsartan when you get home today We will check labs to make sure there is no electrolyte imbalance Use the meclizine as needed for vertigo symptoms but remember it will make you sleepy! Use the zofran if needed for nausea If you have persistent or worsening symptoms please let me know!  If you have any severe symptoms such as bad headache or weakness/ numbness in any part of your body please seek emergency care

## 2016-05-05 NOTE — Telephone Encounter (Signed)
Patient called stating that she is having a reaction to medications she started on 04/27/2016. States she started taking Lasix and Valsartan and is having sever dizziness. Transferred to [facility]. Spoke with Shanon Brow.

## 2016-05-05 NOTE — Telephone Encounter (Signed)
Noted.  Message routed to Dr. Lorelei Pont and Norvel Richards, CMA for review.

## 2016-05-05 NOTE — Telephone Encounter (Signed)
Patient Name: Natasha Trujillo  DOB: [birth year]    Initial Comment having dizziness from new med. she started 2 wks ago, 2-3 times yesterday, got out of bed this morning had to hold on things to steady herself, laying in bed now but things seem to be moving, takes med at [facility] each morning   Nurse Assessment  Nurse: Leilani Merl, RN, Nira Conn Date/Time ([place] Time): 05/05/2016 8:19:30 AM  Confirm and document reason for call. If symptomatic, describe symptoms. You must click the next button to save text entered. ---Caller states that she is having dizziness from new meds. she started 2 wks ago, 2-3 times yesterday, got out of bed this morning had to hold on things to steady herself, laying in bed now but things seem to be moving, takes med at 9am each morning  Has the patient traveled out of the country within the last 30 days? ---Not Applicable  Does the patient have any new or worsening symptoms? ---Yes  Will a triage be completed? ---Yes  Related visit to physician within the last 2 weeks? ---No  Does the PT have any chronic conditions? (i.e. diabetes, asthma, etc.) ---Yes  List chronic conditions. ---HTN  Is this a behavioral health or substance abuse call? ---No     Guidelines    Guideline Title Affirmed Question Affirmed Notes  Dizziness - Vertigo SEVERE dizziness (vertigo) (e.g., unable to walk without assistance)    Final Disposition User   Go to ED Now (or PCP triage) Leilani Merl, RN, Heather    Comments  Appt made with Dr. Lorelei Pont for 11 am today.   Referrals  REFERRED TO PCP OFFICE   Disagree/Comply: Comply

## 2016-05-05 NOTE — Progress Notes (Signed)
[place] at [facility] [address] [place], [place] [address] [phone]  Date:  05/05/2016   Name:  Natasha Trujillo   DOB:  1946-07-10   MRN:  [id number]  PCP:  Ann Held, DO    Chief Complaint: Dizziness   History of Present Illness:  Natasha Trujillo is a 70 y.o. very pleasant female patient who presents with the following:  She was here just over a week ago to follow-up BP.  She was having LE edema with norvasc- changed over to lasix and valsartan for her BP Yesterday she had several episodes of dizziness.  She noted that holding still would help the vertigo to stop after 20 seconds or so.  Early this morning she got out of bed and noted severe spinning. She made it to the restroom by hanging on to furniture.  Went back to sleep- later this am around 7:30 she noted that any movement of her head would make her very dizzy She did have diarrhea today- 3-4 episodes, no blood.  She has felt nauseated and did "heave" one time but has not really vomited  She has never had this in the past.    Yesterday had a subway sandwich that tasted bad but she does not think it was spoined  She notes a very mild HA. No abd pain No fever No tinnitus  Vision is ok now. Did seem hard to focus earlier this am No weakness, numbness, slurred speech, facial drooping, CP or SOB Did not take her valsartan yet today, normally takes her BP med in the morning   Orthostatic VS for the past 24 hrs:  BP- Lying Pulse- Lying BP- Sitting Pulse- Sitting BP- Standing at 0 minutes Pulse- Standing at 0 minutes  05/05/16 1153 149/87 mmHg 63 168/83 mmHg 65 173/90 mmHg 75     BP Readings from Last 3 Encounters:  05/05/16 149/87  04/27/16 140/90  01/25/16 166/93   Wt Readings from Last 3 Encounters:  04/27/16 118 lb 6.4 oz (53.706 kg)  01/25/16 119 lb (53.978 kg)  10/28/15 122 lb 3.2 oz (55.43 kg)     Patient Active Problem List   Diagnosis Date Noted  . Right knee pain 02/03/2015  . OSTEOPENIA 03/03/2010  . POSTMENOPAUSAL STATUS 07/26/2008  . HYPERLIPIDEMIA 02/28/2007  . HYPERTENSION 02/28/2007  . COLONOSCOPY, HX OF 02/28/2007  . BREAST BIOPSY, HX OF 02/28/2007    Past Medical History  Diagnosis Date  . Hyperlipidemia   . Hypertension   . Osteopenia     Past Surgical History  Procedure Laterality Date  . Tubal ligation    . Knee surgery Left 09/12/13    Social History  Substance Use Topics  . Smoking status: Never Smoker   . Smokeless tobacco: Never Used  . Alcohol Use: Yes     Comment: occasional    Family History  Problem Relation Age of Onset  . Coronary artery disease      female 1st degree relative <50/female 1st degree relative <60  . Cervical cancer    . Lung cancer    . Dementia Mother   . Hypertension Mother   . Heart disease Mother 45    CABG  . Hypertension Father   . Heart disease Father 44    MI  . Diabetes Brother 16    type 1  . Hypertension Brother   . Heart disease Brother     cabg  .  Cancer Maternal Aunt 68    cervical  . Diabetes Other     committed suicide  . Colon cancer Neg Hx     Allergies  Allergen Reactions  . Adrenalin [Epinephrine] Other (See Comments)    dizziness  . Lisinopril Cough    Medication list has been reviewed and updated.  Current Outpatient Prescriptions on File Prior to Visit  Medication Sig Dispense Refill  . aspirin 81 MG EC tablet Take 81 mg by mouth.      Marland Kitchen atorvastatin (LIPITOR) 20 MG tablet Take 1 tablet (20 mg total) by mouth daily. 90 tablet 0  . buPROPion (WELLBUTRIN XL) 150 MG 24 hr tablet TAKE 1 TABLET (150 MG TOTAL) BY MOUTH EVERY MORNING. 90 tablet 1  . Calcium 1500 MG tablet Take 1,500 mg by mouth.      . cholecalciferol (VITAMIN D) 1000 UNITS tablet Take 1,000 Units by mouth.      . Diclofenac Sodium (PENNSAID TD) Place 1 application onto the skin daily.    . furosemide (LASIX) 20 MG tablet Take 1 tablet (20 mg total)  by mouth daily. 30 tablet 3  . loratadine (CLARITIN) 10 MG tablet Take 1 tablet (10 mg total) by mouth daily. 90 tablet 3  . Multiple Vitamins-Minerals (MULTIVITAMIN PO) Take 1 tablet by mouth daily.    . Omega-3 Fatty Acids (FISH OIL) 1000 MG CAPS Take 1 capsule by mouth daily.      . valsartan (DIOVAN) 160 MG tablet Take 1 tablet (160 mg total) by mouth daily. 30 tablet 2  . vitamin E 400 UNIT capsule Take 400 Units by mouth.       No current facility-administered medications on file prior to visit.    Review of Systems:  As per HPI- otherwise negative.   Physical Examination: Filed Vitals:   05/05/16 1146  BP: 149/87  Pulse: 63  Temp: 97.7 F (36.5 C)   Filed Vitals:   05/05/16 1146  Height: 5\' 2"  (1.575 m)   There is no weight on file to calculate BMI. Ideal Body Weight: Weight in (lb) to have BMI = 25: 136.4  GEN: WDWN, NAD, Non-toxic, A & O x 3, petite build, here with her husband today.  Looks well HEENT: Atraumatic, Normocephalic. Neck supple. No masses, No LAD.  Bilateral TM wnl with tubes in place, oropharynx normal.  PEERL,EOMI.   Ears and Nose: No external deformity. CV: RRR, No M/G/R. No JVD. No thrill. No extra heart sounds. PULM: CTA B, no wheezes, crackles, rhonchi. No retractions. No resp. distress. No accessory muscle use. ABD: S, NT, ND EXTR: No c/c/e NEURO Normal gait but she is moving slowly due to fear of dizziness. Normal strength, sensation and DTR all extremities, normal facial movement and sensation, normal heel to shin and RAM, positive dix- halpike to left only PSYCH: Normally interactive. Conversant. Not depressed or anxious appearing.  Calm demeanor.    Assessment and Plan: Benign paroxysmal positional vertigo, unspecified laterality - Plan: CBC, Comprehensive metabolic panel, meclizine (ANTIVERT) 25 MG tablet, ondansetron (ZOFRAN) 4 MG tablet  Essential hypertension  Here today with likely BPPV.  This is probably unrelated to her medication  change but will have her stop lasix as her pedal edema is resolved.  Check CBC and CMP today, meclizine and zofran to use as needed   I think that your dizziness is caused by BPPV- this is a common and benign type of vertigo and generally will resolve in a few days Stop  using the furosemide now, but do take your valsartan when you get home today We will check labs to make sure there is no electrolyte imbalance Use the meclizine as needed for vertigo symptoms but remember it will make you sleepy! Use the zofran if needed for nausea If you have persistent or worsening symptoms please let me know!  If you have any severe symptoms such as bad headache or weakness/ numbness in any part of your body please seek emergency car  Signed Lamar Blinks, MD

## 2016-05-05 NOTE — Progress Notes (Signed)
Pre visit review using our clinic review tool, if applicable. No additional management support is needed unless otherwise documented below in the visit note. 

## 2016-05-05 NOTE — Telephone Encounter (Signed)
Pt seen in clinic today.  

## 2016-05-07 ENCOUNTER — Telehealth: Payer: Self-pay | Admitting: Family Medicine

## 2016-05-07 ENCOUNTER — Encounter: Payer: Self-pay | Admitting: Family Medicine

## 2016-05-07 DIAGNOSIS — H8110 Benign paroxysmal vertigo, unspecified ear: Secondary | ICD-10-CM

## 2016-05-07 DIAGNOSIS — R42 Dizziness and giddiness: Secondary | ICD-10-CM

## 2016-05-07 NOTE — Telephone Encounter (Signed)
Duplicate request.    KP 

## 2016-05-07 NOTE — Telephone Encounter (Signed)
°  Relationship to patient: Self  Can be reached: [phone]   Reason for call: Request referral to Dr. Melony Overly, ENT. Patient states she has an appointment today and forgot to call for the referral.

## 2016-05-18 ENCOUNTER — Encounter: Payer: Self-pay | Admitting: Family Medicine

## 2016-05-18 ENCOUNTER — Ambulatory Visit (INDEPENDENT_AMBULATORY_CARE_PROVIDER_SITE_OTHER): Payer: Commercial Managed Care - HMO | Admitting: Family Medicine

## 2016-05-18 VITALS — BP 160/92 | HR 76 | Temp 98.7°F | Ht 62.0 in | Wt 118.0 lb

## 2016-05-18 DIAGNOSIS — E2839 Other primary ovarian failure: Secondary | ICD-10-CM | POA: Diagnosis not present

## 2016-05-18 DIAGNOSIS — Z01419 Encounter for gynecological examination (general) (routine) without abnormal findings: Secondary | ICD-10-CM

## 2016-05-18 DIAGNOSIS — I10 Essential (primary) hypertension: Secondary | ICD-10-CM

## 2016-05-18 DIAGNOSIS — Z1231 Encounter for screening mammogram for malignant neoplasm of breast: Secondary | ICD-10-CM

## 2016-05-18 DIAGNOSIS — R739 Hyperglycemia, unspecified: Secondary | ICD-10-CM | POA: Diagnosis not present

## 2016-05-18 NOTE — Progress Notes (Signed)
Pre visit review using our clinic review tool, if applicable. No additional management support is needed unless otherwise documented below in the visit note. 
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
## 2016-05-18 NOTE — Assessment & Plan Note (Signed)
Elevated in office but normal at home per pt Pt will rto with her bp cuff to compare  con't diovan for now

## 2016-05-18 NOTE — Patient Instructions (Addendum)
Hypertension Hypertension, commonly called high blood pressure, is when the force of blood pumping through your arteries is too strong. Your arteries are the blood vessels that carry blood from your heart throughout your body. A blood pressure reading consists of a higher number over a lower number, such as 110/72. The higher number (systolic) is the pressure inside your arteries when your heart pumps. The lower number (diastolic) is the pressure inside your arteries when your heart relaxes. Ideally you want your blood pressure below 120/80. Hypertension forces your heart to work harder to pump blood. Your arteries may become narrow or stiff. Having untreated or uncontrolled hypertension can cause heart attack, stroke, kidney disease, and other problems. RISK FACTORS Some risk factors for high blood pressure are controllable. Others are not.  Risk factors you cannot control include:  1. Race. You may be at higher risk if you are African American. 2. Age. Risk increases with age. 3. Gender. Men are at higher risk than women before age 26 years. After age 83, women are at higher risk than men. Risk factors you can control include:  Not getting enough exercise or physical activity.  Being overweight.  Getting too much fat, sugar, calories, or salt in your diet.  Drinking too much alcohol. SIGNS AND SYMPTOMS Hypertension does not usually cause signs or symptoms. Extremely high blood pressure (hypertensive crisis) may cause headache, anxiety, shortness of breath, and nosebleed. DIAGNOSIS To check if you have hypertension, your health care provider will measure your blood pressure while you are seated, with your arm held at the level of your heart. It should be measured at least twice using the same arm. Certain conditions can cause a difference in blood pressure between your right and left arms. A blood pressure reading that is higher than normal on one occasion does not mean that you need treatment.  If it is not clear whether you have high blood pressure, you may be asked to return on a different day to have your blood pressure checked again. Or, you may be asked to monitor your blood pressure at home for 1 or more weeks. TREATMENT Treating high blood pressure includes making lifestyle changes and possibly taking medicine. Living a healthy lifestyle can help lower high blood pressure. You may need to change some of your habits. Lifestyle changes may include:  Following the DASH diet. This diet is high in fruits, vegetables, and whole grains. It is low in salt, red meat, and added sugars.  Keep your sodium intake below 2,300 mg per day.  Getting at least 30-45 minutes of aerobic exercise at least 4 times per week.  Losing weight if necessary.  Not smoking.  Limiting alcoholic beverages.  Learning ways to reduce stress. Your health care provider may prescribe medicine if lifestyle changes are not enough to get your blood pressure under control, and if one of the following is true:  You are 38-32 years of age and your systolic blood pressure is above 140.  You are 35 years of age or older, and your systolic blood pressure is above 150.  Your diastolic blood pressure is above 90.  You have diabetes, and your systolic blood pressure is over [id number] or your diastolic blood pressure is over 90.  You have kidney disease and your blood pressure is above 140/90.  You have heart disease and your blood pressure is above 140/90. Your personal target blood pressure may vary depending on your medical conditions, your age, and other factors. HOME CARE INSTRUCTIONS  Have your blood pressure rechecked as directed by your health care provider.   Take medicines only as directed by your health care provider. Follow the directions carefully. Blood pressure medicines must be taken as prescribed. The medicine does not work as well when you skip doses. Skipping doses also puts you at risk for  problems.  Do not smoke.   Monitor your blood pressure at home as directed by your health care provider. SEEK MEDICAL CARE IF:   You think you are having a reaction to medicines taken.  You have recurrent headaches or feel dizzy.  You have swelling in your ankles.  You have trouble with your vision. SEEK IMMEDIATE MEDICAL CARE IF:  You develop a severe headache or confusion.  You have unusual weakness, numbness, or feel faint.  You have severe chest or abdominal pain.  You vomit repeatedly.  You have trouble breathing. MAKE SURE YOU:   Understand these instructions.  Will watch your condition.  Will get help right away if you are not doing well or get worse.   This information is not intended to replace advice given to you by your health care provider. Make sure you discuss any questions you have with your health care provider.   Document Released: 10/25/2005 Document Revised: 03/11/2015 Document Reviewed: 08/17/2013 Elsevier Interactive Patient Education 2016 [employer]   Teaching laboratory technicianpley Maneuver Self-Care WHAT IS THE EPLEY MANEUVER? The Epley maneuver is an exercise you can do to relieve symptoms of benign paroxysmal positional vertigo (BPPV). This condition is often just referred to as vertigo. BPPV is caused by the movement of tiny crystals (canaliths) inside your inner ear. The accumulation and movement of canaliths in your inner ear causes a sudden spinning sensation (vertigo) when you move your head to certain positions. Vertigo usually lasts about 30 seconds. BPPV usually occurs in just one ear. If you get vertigo when you lie on your left side, you probably have BPPV in your left ear. Your health care provider can tell you which ear is involved.  BPPV may be caused by a head injury. Many people older than 50 get BPPV for unknown reasons. If you have been diagnosed with BPPV, your health care provider may teach you how to do this maneuver. BPPV is not life threatening  (benign) and usually goes away in time.  WHEN SHOULD I PERFORM THE EPLEY MANEUVER? You can do this maneuver at home whenever you have symptoms of vertigo. You may do the Epley maneuver up to 3 times a day until your symptoms of vertigo go away. HOW SHOULD I DO THE EPLEY MANEUVER? 4. Sit on the edge of a bed or table with your back straight. Your legs should be extended or hanging over the edge of the bed or table.  5. Turn your head halfway toward the affected ear.  6. Lie backward quickly with your head turned until you are lying flat on your back. You may want to position a pillow under your shoulders.  7. Hold this position for 30 seconds. You may experience an attack of vertigo. This is normal. Hold this position until the vertigo stops. 8. Then turn your head to the opposite direction until your unaffected ear is facing the floor.  9. Hold this position for 30 seconds. You may experience an attack of vertigo. This is normal. Hold this position until the vertigo stops. 10. Now turn your whole body to the same side as your head. Hold for another 30 seconds.  11. You  can then sit back up. ARE THERE RISKS TO THIS MANEUVER? In some cases, you may have other symptoms (such as changes in your vision, weakness, or numbness). If you have these symptoms, stop doing the maneuver and call your health care provider. Even if doing these maneuvers relieves your vertigo, you may still have dizziness. Dizziness is the sensation of light-headedness but without the sensation of movement. Even though the Epley maneuver may relieve your vertigo, it is possible that your symptoms will return within 5 years. WHAT SHOULD I DO AFTER THIS MANEUVER? After doing the Epley maneuver, you can return to your normal activities. Ask your doctor if there is anything you should do at home to prevent vertigo. This may include:  Sleeping with two or more pillows to keep your head elevated.  Not sleeping on the side of your  affected ear.  Getting up slowly from bed.  Avoiding sudden movements during the day.  Avoiding extreme head movement, like looking up or bending over.  Wearing a cervical collar to prevent sudden head movements. WHAT SHOULD I DO IF MY SYMPTOMS GET WORSE? Call your health care provider if your vertigo gets worse. Call your provider right way if you have other symptoms, including:   Nausea.  Vomiting.  Headache.  Weakness.  Numbness.  Vision changes.   This information is not intended to replace advice given to you by your health care provider. Make sure you discuss any questions you have with your health care provider.   Document Released: 10/30/2013 Document Reviewed: 10/30/2013 Elsevier Interactive Patient Education [employer].

## 2016-05-21 ENCOUNTER — Encounter: Payer: Self-pay | Admitting: Family Medicine

## 2016-05-24 DIAGNOSIS — M7981 Nontraumatic hematoma of soft tissue: Secondary | ICD-10-CM | POA: Diagnosis not present

## 2016-05-25 ENCOUNTER — Encounter: Payer: Self-pay | Admitting: Family Medicine

## 2016-05-25 NOTE — Telephone Encounter (Signed)
Natasha Trujillo, the GYN referral is for Dr.Grewal. Is that ok?

## 2016-05-28 ENCOUNTER — Other Ambulatory Visit (INDEPENDENT_AMBULATORY_CARE_PROVIDER_SITE_OTHER): Payer: Commercial Managed Care - HMO

## 2016-05-28 DIAGNOSIS — R739 Hyperglycemia, unspecified: Secondary | ICD-10-CM

## 2016-05-28 LAB — BASIC METABOLIC PANEL
BUN: 12 mg/dL (ref 6–23)
CALCIUM: 9.6 mg/dL (ref 8.4–10.5)
CO2: 31 meq/L (ref 19–32)
CREATININE: 0.68 mg/dL (ref 0.40–1.20)
Chloride: 101 mEq/L (ref 96–112)
GFR: 91.21 mL/min (ref >60.00)
Glucose, Bld: 82 mg/dL (ref 70–99)
Potassium: 3.9 mEq/L (ref 3.5–5.1)
Sodium: 137 mEq/L (ref 135–145)

## 2016-05-28 LAB — HEMOGLOBIN A1C: Hgb A1c MFr Bld: 5.7 % (ref 4.6–6.5)

## 2016-06-18 ENCOUNTER — Encounter: Payer: Self-pay | Admitting: Family Medicine

## 2016-06-18 DIAGNOSIS — I10 Essential (primary) hypertension: Secondary | ICD-10-CM

## 2016-06-18 MED ORDER — VALSARTAN 160 MG PO TABS: 160.0000 mg | ORAL_TABLET | Freq: Every day | ORAL | 1 refills | Status: DC

## 2016-06-22 ENCOUNTER — Encounter: Payer: Self-pay | Admitting: Family Medicine

## 2016-07-05 ENCOUNTER — Other Ambulatory Visit: Payer: Self-pay | Admitting: Family Medicine

## 2016-07-09 ENCOUNTER — Telehealth: Payer: Self-pay | Admitting: Family Medicine

## 2016-07-09 NOTE — Telephone Encounter (Signed)
Sent patient a message via Active My Chart account to inform that appointment with Dr. Carollee Herter scheduled for 08/19/2016 has been cancelled. Asked patient to call the office to reschedule.

## 2016-07-14 ENCOUNTER — Encounter: Payer: Self-pay | Admitting: Family Medicine

## 2016-07-14 NOTE — Telephone Encounter (Signed)
Natasha Trujillo-- please help pt with above appt request. Thanks!

## 2016-07-26 ENCOUNTER — Other Ambulatory Visit: Payer: Self-pay | Admitting: Family Medicine

## 2016-07-26 DIAGNOSIS — I10 Essential (primary) hypertension: Secondary | ICD-10-CM

## 2016-07-26 DIAGNOSIS — R609 Edema, unspecified: Secondary | ICD-10-CM

## 2016-08-08 HISTORY — PX: BLEPHAROPLASTY: SUR158

## 2016-08-09 ENCOUNTER — Ambulatory Visit (INDEPENDENT_AMBULATORY_CARE_PROVIDER_SITE_OTHER): Payer: Commercial Managed Care - HMO | Admitting: Family Medicine

## 2016-08-09 ENCOUNTER — Encounter: Payer: Self-pay | Admitting: Family Medicine

## 2016-08-09 VITALS — BP 152/92 | HR 70 | Temp 98.1°F | Resp 17 | Ht 62.0 in | Wt 118.0 lb

## 2016-08-09 DIAGNOSIS — R0789 Other chest pain: Secondary | ICD-10-CM | POA: Diagnosis not present

## 2016-08-09 DIAGNOSIS — M25511 Pain in right shoulder: Secondary | ICD-10-CM

## 2016-08-09 MED ORDER — VALSARTAN 320 MG PO TABS
320.0000 mg | ORAL_TABLET | Freq: Every day | ORAL | 1 refills | Status: DC
Start: 2016-08-09 — End: 2017-03-09

## 2016-08-09 NOTE — Patient Instructions (Signed)
Hypertension Hypertension, commonly called high blood pressure, is when the force of blood pumping through your arteries is too strong. Your arteries are the blood vessels that carry blood from your heart throughout your body. A blood pressure reading consists of a higher number over a lower number, such as 110/72. The higher number (systolic) is the pressure inside your arteries when your heart pumps. The lower number (diastolic) is the pressure inside your arteries when your heart relaxes. Ideally you want your blood pressure below 120/80. Hypertension forces your heart to work harder to pump blood. Your arteries may become narrow or stiff. Having untreated or uncontrolled hypertension can cause heart attack, stroke, kidney disease, and other problems. RISK FACTORS Some risk factors for high blood pressure are controllable. Others are not.  Risk factors you cannot control include:   Race. You may be at higher risk if you are African American.  Age. Risk increases with age.  Gender. Men are at higher risk than women before age 45 years. After age 65, women are at higher risk than men. Risk factors you can control include:  Not getting enough exercise or physical activity.  Being overweight.  Getting too much fat, sugar, calories, or salt in your diet.  Drinking too much alcohol. SIGNS AND SYMPTOMS Hypertension does not usually cause signs or symptoms. Extremely high blood pressure (hypertensive crisis) may cause headache, anxiety, shortness of breath, and nosebleed. DIAGNOSIS To check if you have hypertension, your health care provider will measure your blood pressure while you are seated, with your arm held at the level of your heart. It should be measured at least twice using the same arm. Certain conditions can cause a difference in blood pressure between your right and left arms. A blood pressure reading that is higher than normal on one occasion does not mean that you need treatment. If  it is not clear whether you have high blood pressure, you may be asked to return on a different day to have your blood pressure checked again. Or, you may be asked to monitor your blood pressure at home for 1 or more weeks. TREATMENT Treating high blood pressure includes making lifestyle changes and possibly taking medicine. Living a healthy lifestyle can help lower high blood pressure. You may need to change some of your habits. Lifestyle changes may include:  Following the DASH diet. This diet is high in fruits, vegetables, and whole grains. It is low in salt, red meat, and added sugars.  Keep your sodium intake below 2,300 mg per day.  Getting at least 30-45 minutes of aerobic exercise at least 4 times per week.  Losing weight if necessary.  Not smoking.  Limiting alcoholic beverages.  Learning ways to reduce stress. Your health care provider may prescribe medicine if lifestyle changes are not enough to get your blood pressure under control, and if one of the following is true:  You are 18-59 years of age and your systolic blood pressure is above 140.  You are 60 years of age or older, and your systolic blood pressure is above 150.  Your diastolic blood pressure is above 90.  You have diabetes, and your systolic blood pressure is over 140 or your diastolic blood pressure is over 90.  You have kidney disease and your blood pressure is above 140/90.  You have heart disease and your blood pressure is above 140/90. Your personal target blood pressure may vary depending on your medical conditions, your age, and other factors. HOME CARE INSTRUCTIONS    Have your blood pressure rechecked as directed by your health care provider.   Take medicines only as directed by your health care provider. Follow the directions carefully. Blood pressure medicines must be taken as prescribed. The medicine does not work as well when you skip doses. Skipping doses also puts you at risk for  problems.  Do not smoke.   Monitor your blood pressure at home as directed by your health care provider. SEEK MEDICAL CARE IF:   You think you are having a reaction to medicines taken.  You have recurrent headaches or feel dizzy.  You have swelling in your ankles.  You have trouble with your vision. SEEK IMMEDIATE MEDICAL CARE IF:  You develop a severe headache or confusion.  You have unusual weakness, numbness, or feel faint.  You have severe chest or abdominal pain.  You vomit repeatedly.  You have trouble breathing. MAKE SURE YOU:   Understand these instructions.  Will watch your condition.  Will get help right away if you are not doing well or get worse.   This information is not intended to replace advice given to you by your health care provider. Make sure you discuss any questions you have with your health care provider.   Document Released: 10/25/2005 Document Revised: 03/11/2015 Document Reviewed: 08/17/2013 Elsevier Interactive Patient Education 2016 [employer]  

## 2016-08-09 NOTE — Progress Notes (Signed)
Pre visit review using our clinic review tool, if applicable. No additional management support is needed unless otherwise documented below in the visit note. 

## 2016-08-09 NOTE — Progress Notes (Signed)
Patient ID: Natasha Trujillo, female    DOB: [birth year]  Age: 70 y.o. MRN: [id number]    Subjective:  Subjective  HPI Natasha Trujillo presents for f/u bp --- running ok at home.   Pt c/o pain in both sides of neck at times-- comes and goes. She also c/o R shoulder pain and is unable to lift arm all the way up and it is hindering her w/u -- weight training.    Review of Systems  Constitutional: Negative for appetite change, diaphoresis, fatigue and unexpected weight change.  Eyes: Negative for pain, redness and visual disturbance.  Respiratory: Negative for cough, chest tightness, shortness of breath and wheezing.   Cardiovascular: Negative for chest pain, palpitations and leg swelling.  Endocrine: Negative for cold intolerance, heat intolerance, polydipsia, polyphagia and polyuria.  Genitourinary: Negative for difficulty urinating, dysuria and frequency.  Musculoskeletal: Positive for arthralgias and neck pain. Negative for neck stiffness.  Neurological: Negative for dizziness, light-headedness, numbness and headaches.    History Past Medical History:  Diagnosis Date  . Hyperlipidemia   . Hypertension   . Osteopenia     She has a past surgical history that includes Tubal ligation and Knee surgery (Left, 09/12/13).   Her family history includes Cancer (age of onset: 33) in her maternal aunt; Dementia in her mother; Diabetes in her other; Diabetes (age of onset: 36) in her brother; Heart disease in her brother; Heart disease (age of onset: 62) in her father and mother; Hypertension in her brother, father, and mother.She reports that she has never smoked. She has never used smokeless tobacco. She reports that she drinks alcohol. She reports that she does not use drugs.  Current Outpatient Prescriptions on File Prior to Visit  Medication Sig Dispense Refill  . aspirin 81 MG EC tablet Take 81 mg by mouth.      Marland Kitchen atorvastatin (LIPITOR) 20 MG tablet TAKE 1 TABLET EVERY DAY 90  tablet 0  . buPROPion (WELLBUTRIN XL) 150 MG 24 hr tablet TAKE 1 TABLET (150 MG TOTAL) BY MOUTH EVERY MORNING. 90 tablet 1  . Calcium 1500 MG tablet Take 1,500 mg by mouth.      . cholecalciferol (VITAMIN D) 1000 UNITS tablet Take 1,000 Units by mouth.      . Diclofenac Sodium (PENNSAID TD) Place 1 application onto the skin daily.    . furosemide (LASIX) 20 MG tablet TAKE 1 TABLET EVERY DAY 90 tablet 1  . loratadine (CLARITIN) 10 MG tablet Take 1 tablet (10 mg total) by mouth daily. 90 tablet 3  . Multiple Vitamins-Minerals (MULTIVITAMIN PO) Take 1 tablet by mouth daily.    . Omega-3 Fatty Acids (FISH OIL) 1000 MG CAPS Take 1 capsule by mouth daily.      . vitamin E 400 UNIT capsule Take 400 Units by mouth.       No current facility-administered medications on file prior to visit.      Objective:  Objective  Physical Exam  Constitutional: She is oriented to person, place, and time. She appears well-developed and well-nourished.  HENT:  Head: Normocephalic and atraumatic.  Eyes: Conjunctivae and EOM are normal.  Neck: Normal range of motion. Neck supple. No JVD present. Carotid bruit is not present. No thyromegaly present.  Cardiovascular: Normal rate, regular rhythm and normal heart sounds.   No murmur heard. Pulmonary/Chest: Effort normal and breath sounds normal. No respiratory distress. She has no wheezes. She has no rales. She exhibits no tenderness.  Musculoskeletal: She exhibits  no edema.  Neurological: She is alert and oriented to person, place, and time.  Psychiatric: She has a normal mood and affect. Her behavior is normal. Judgment and thought content normal.  Nursing note and vitals reviewed.  BP (!) 169/89 (BP Location: Right Arm, Patient Position: Sitting, Cuff Size: Normal)   Pulse 70   Temp 98.1 F (36.7 C) (Oral)   Resp 17   Ht 5\' 2"  (1.575 m)   Wt 118 lb (53.5 kg)   SpO2 100%   BMI 21.58 kg/m  Wt Readings from Last 3 Encounters:  08/09/16 118 lb (53.5 kg)    05/18/16 118 lb (53.5 kg)  04/27/16 118 lb 6.4 oz (53.7 kg)     Lab Results  Component Value Date   WBC 5.1 05/05/2016   HGB 12.4 05/05/2016   HCT 37.5 05/05/2016   PLT 276.0 05/05/2016   GLUCOSE 82 05/28/2016   CHOL 167 04/27/2016   TRIG 62.0 04/27/2016   HDL 73.10 04/27/2016   LDLCALC 81 04/27/2016   ALT 15 05/05/2016   AST 17 05/05/2016   NA 137 05/28/2016   K 3.9 05/28/2016   CL 101 05/28/2016   CREATININE 0.68 05/28/2016   BUN 12 05/28/2016   CO2 31 05/28/2016   TSH 2.04 10/28/2015   HGBA1C 5.7 05/28/2016   MICROALBUR 0.50 05/10/2013    No results found.   Assessment & Plan:  Plan  I have discontinued [name]'s valsartan. I am also having her start on valsartan. Additionally, I am having her maintain her aspirin, Calcium, Fish Oil, vitamin E, cholecalciferol, Multiple Vitamins-Minerals (MULTIVITAMIN PO), Diclofenac Sodium (PENNSAID TD), loratadine, buPROPion, atorvastatin, and furosemide.  Meds ordered this encounter  Medications  . valsartan (DIOVAN) 320 MG tablet    Sig: Take 1 tablet (320 mg total) by mouth daily.    Dispense:  90 tablet    Refill:  1    Problem List Items Addressed This Visit    None    Visit Diagnoses    Atypical chest pain    -  Primary   Relevant Orders   EKG 12-Lead (Completed)   Pain in joint of right shoulder       Relevant Orders   Ambulatory referral to Orthopedic Surgery      Follow-up: Return as scheduled.  Ann Held, DO

## 2016-08-19 ENCOUNTER — Ambulatory Visit: Payer: Commercial Managed Care - HMO | Admitting: Family Medicine

## 2016-08-24 DIAGNOSIS — M19011 Primary osteoarthritis, right shoulder: Secondary | ICD-10-CM | POA: Diagnosis not present

## 2016-08-26 DIAGNOSIS — M25511 Pain in right shoulder: Secondary | ICD-10-CM | POA: Diagnosis not present

## 2016-08-26 DIAGNOSIS — M7551 Bursitis of right shoulder: Secondary | ICD-10-CM | POA: Diagnosis not present

## 2016-08-27 DIAGNOSIS — Z6822 Body mass index (BMI) 22.0-22.9, adult: Secondary | ICD-10-CM | POA: Diagnosis not present

## 2016-08-27 DIAGNOSIS — Z1231 Encounter for screening mammogram for malignant neoplasm of breast: Secondary | ICD-10-CM | POA: Diagnosis not present

## 2016-08-27 DIAGNOSIS — Z124 Encounter for screening for malignant neoplasm of cervix: Secondary | ICD-10-CM | POA: Diagnosis not present

## 2016-08-30 DIAGNOSIS — M7551 Bursitis of right shoulder: Secondary | ICD-10-CM | POA: Diagnosis not present

## 2016-08-30 DIAGNOSIS — M25511 Pain in right shoulder: Secondary | ICD-10-CM | POA: Diagnosis not present

## 2016-08-31 LAB — HM PAP SMEAR: HM PAP: NEGATIVE

## 2016-09-03 DIAGNOSIS — M7551 Bursitis of right shoulder: Secondary | ICD-10-CM | POA: Diagnosis not present

## 2016-09-03 DIAGNOSIS — M25511 Pain in right shoulder: Secondary | ICD-10-CM | POA: Diagnosis not present

## 2016-09-07 DIAGNOSIS — M7551 Bursitis of right shoulder: Secondary | ICD-10-CM | POA: Diagnosis not present

## 2016-09-09 ENCOUNTER — Encounter: Payer: Self-pay | Admitting: Family Medicine

## 2016-09-09 ENCOUNTER — Telehealth: Payer: Self-pay | Admitting: Family Medicine

## 2016-09-09 NOTE — Telephone Encounter (Signed)
Sent message via My Chart and mailed letter to patient to inform her that Dr. Carollee Herter will be out of the office on October 28, 2016 and we need to reschedule appointment. Appointment cancelled

## 2016-09-10 DIAGNOSIS — M25511 Pain in right shoulder: Secondary | ICD-10-CM | POA: Diagnosis not present

## 2016-09-10 DIAGNOSIS — M7551 Bursitis of right shoulder: Secondary | ICD-10-CM | POA: Diagnosis not present

## 2016-09-13 DIAGNOSIS — H02831 Dermatochalasis of right upper eyelid: Secondary | ICD-10-CM | POA: Diagnosis not present

## 2016-09-13 DIAGNOSIS — H02834 Dermatochalasis of left upper eyelid: Secondary | ICD-10-CM | POA: Diagnosis not present

## 2016-09-15 ENCOUNTER — Encounter: Payer: Self-pay | Admitting: Family Medicine

## 2016-09-15 NOTE — Telephone Encounter (Signed)
Dr Etter Sjogren-- please advise pt's CPE request?

## 2016-09-16 NOTE — Telephone Encounter (Signed)
Is there a hospital f/u or something we can squeeze her into?

## 2016-09-22 DIAGNOSIS — M25511 Pain in right shoulder: Secondary | ICD-10-CM | POA: Diagnosis not present

## 2016-09-22 DIAGNOSIS — M7551 Bursitis of right shoulder: Secondary | ICD-10-CM | POA: Diagnosis not present

## 2016-09-24 DIAGNOSIS — M25511 Pain in right shoulder: Secondary | ICD-10-CM | POA: Diagnosis not present

## 2016-09-24 DIAGNOSIS — M7551 Bursitis of right shoulder: Secondary | ICD-10-CM | POA: Diagnosis not present

## 2016-10-04 ENCOUNTER — Other Ambulatory Visit: Payer: Self-pay | Admitting: Family Medicine

## 2016-10-04 DIAGNOSIS — M7551 Bursitis of right shoulder: Secondary | ICD-10-CM | POA: Diagnosis not present

## 2016-10-04 DIAGNOSIS — M25511 Pain in right shoulder: Secondary | ICD-10-CM | POA: Diagnosis not present

## 2016-10-06 DIAGNOSIS — M25511 Pain in right shoulder: Secondary | ICD-10-CM | POA: Diagnosis not present

## 2016-10-06 DIAGNOSIS — M7551 Bursitis of right shoulder: Secondary | ICD-10-CM | POA: Diagnosis not present

## 2016-10-11 DIAGNOSIS — M7551 Bursitis of right shoulder: Secondary | ICD-10-CM | POA: Diagnosis not present

## 2016-10-11 DIAGNOSIS — M25511 Pain in right shoulder: Secondary | ICD-10-CM | POA: Diagnosis not present

## 2016-10-13 DIAGNOSIS — M7551 Bursitis of right shoulder: Secondary | ICD-10-CM | POA: Diagnosis not present

## 2016-10-13 DIAGNOSIS — M25511 Pain in right shoulder: Secondary | ICD-10-CM | POA: Diagnosis not present

## 2016-10-18 DIAGNOSIS — M7551 Bursitis of right shoulder: Secondary | ICD-10-CM | POA: Diagnosis not present

## 2016-10-18 DIAGNOSIS — M25511 Pain in right shoulder: Secondary | ICD-10-CM | POA: Diagnosis not present

## 2016-10-20 DIAGNOSIS — M25511 Pain in right shoulder: Secondary | ICD-10-CM | POA: Diagnosis not present

## 2016-10-20 DIAGNOSIS — M7551 Bursitis of right shoulder: Secondary | ICD-10-CM | POA: Diagnosis not present

## 2016-10-23 ENCOUNTER — Encounter: Payer: Self-pay | Admitting: Family Medicine

## 2016-10-23 ENCOUNTER — Other Ambulatory Visit: Payer: Self-pay | Admitting: Family Medicine

## 2016-10-25 DIAGNOSIS — M25511 Pain in right shoulder: Secondary | ICD-10-CM | POA: Diagnosis not present

## 2016-10-25 DIAGNOSIS — M7551 Bursitis of right shoulder: Secondary | ICD-10-CM | POA: Diagnosis not present

## 2016-10-27 DIAGNOSIS — M7551 Bursitis of right shoulder: Secondary | ICD-10-CM | POA: Diagnosis not present

## 2016-10-27 DIAGNOSIS — M25511 Pain in right shoulder: Secondary | ICD-10-CM | POA: Diagnosis not present

## 2016-10-28 ENCOUNTER — Encounter: Payer: Commercial Managed Care - HMO | Admitting: Family Medicine

## 2016-11-02 DIAGNOSIS — M7551 Bursitis of right shoulder: Secondary | ICD-10-CM | POA: Diagnosis not present

## 2016-11-02 DIAGNOSIS — M25511 Pain in right shoulder: Secondary | ICD-10-CM | POA: Diagnosis not present

## 2016-11-10 DIAGNOSIS — M25511 Pain in right shoulder: Secondary | ICD-10-CM | POA: Diagnosis not present

## 2016-11-10 DIAGNOSIS — M7551 Bursitis of right shoulder: Secondary | ICD-10-CM | POA: Diagnosis not present

## 2016-11-12 DIAGNOSIS — M7551 Bursitis of right shoulder: Secondary | ICD-10-CM | POA: Diagnosis not present

## 2016-11-12 DIAGNOSIS — M25511 Pain in right shoulder: Secondary | ICD-10-CM | POA: Diagnosis not present

## 2016-11-17 DIAGNOSIS — M7551 Bursitis of right shoulder: Secondary | ICD-10-CM | POA: Diagnosis not present

## 2016-11-17 DIAGNOSIS — M25511 Pain in right shoulder: Secondary | ICD-10-CM | POA: Diagnosis not present

## 2016-11-26 DIAGNOSIS — M25511 Pain in right shoulder: Secondary | ICD-10-CM | POA: Diagnosis not present

## 2016-11-26 DIAGNOSIS — M7551 Bursitis of right shoulder: Secondary | ICD-10-CM | POA: Diagnosis not present

## 2016-11-29 DIAGNOSIS — M7551 Bursitis of right shoulder: Secondary | ICD-10-CM | POA: Diagnosis not present

## 2016-11-29 DIAGNOSIS — M25511 Pain in right shoulder: Secondary | ICD-10-CM | POA: Diagnosis not present

## 2016-11-30 ENCOUNTER — Other Ambulatory Visit: Payer: Self-pay | Admitting: Orthopedic Surgery

## 2016-11-30 DIAGNOSIS — M25519 Pain in unspecified shoulder: Secondary | ICD-10-CM

## 2016-11-30 DIAGNOSIS — M25511 Pain in right shoulder: Secondary | ICD-10-CM | POA: Diagnosis not present

## 2016-12-01 ENCOUNTER — Telehealth: Payer: Self-pay | Admitting: *Deleted

## 2016-12-01 DIAGNOSIS — M7551 Bursitis of right shoulder: Secondary | ICD-10-CM | POA: Diagnosis not present

## 2016-12-01 DIAGNOSIS — M25511 Pain in right shoulder: Secondary | ICD-10-CM | POA: Diagnosis not present

## 2016-12-01 NOTE — Telephone Encounter (Signed)
Called patient and left message to return call

## 2016-12-03 ENCOUNTER — Encounter: Payer: Self-pay | Admitting: Family Medicine

## 2016-12-03 ENCOUNTER — Ambulatory Visit (INDEPENDENT_AMBULATORY_CARE_PROVIDER_SITE_OTHER): Payer: Medicare HMO | Admitting: Family Medicine

## 2016-12-03 VITALS — BP 132/80 | HR 80 | Temp 97.7°F | Resp 16 | Ht 62.0 in | Wt 121.4 lb

## 2016-12-03 DIAGNOSIS — D229 Melanocytic nevi, unspecified: Secondary | ICD-10-CM

## 2016-12-03 DIAGNOSIS — E785 Hyperlipidemia, unspecified: Secondary | ICD-10-CM | POA: Diagnosis not present

## 2016-12-03 DIAGNOSIS — Z0000 Encounter for general adult medical examination without abnormal findings: Secondary | ICD-10-CM | POA: Diagnosis not present

## 2016-12-03 DIAGNOSIS — I10 Essential (primary) hypertension: Secondary | ICD-10-CM

## 2016-12-03 LAB — LIPID PANEL
Cholesterol: 158 mg/dL (ref 0–200)
HDL: 75 mg/dL (ref >39.00)
LDL Cholesterol: 75 mg/dL (ref 0–99)
NONHDL: 82.73
Total CHOL/HDL Ratio: 2
Triglycerides: 38 mg/dL (ref 0.0–149.0)
VLDL: 7.6 mg/dL (ref 0.0–40.0)

## 2016-12-03 LAB — POC URINALSYSI DIPSTICK (AUTOMATED)
Bilirubin, UA: NEGATIVE
Glucose, UA: NEGATIVE
Ketones, UA: NEGATIVE
LEUKOCYTES UA: NEGATIVE
NITRITE UA: NEGATIVE
PH UA: 6
PROTEIN UA: NEGATIVE
RBC UA: NEGATIVE
Spec Grav, UA: 1.02
UROBILINOGEN UA: NEGATIVE

## 2016-12-03 LAB — CBC
HEMATOCRIT: 36.2 % (ref 36.0–46.0)
HEMOGLOBIN: 12.3 g/dL (ref 12.0–15.0)
MCHC: 34 g/dL (ref 30.0–36.0)
MCV: 93.9 fl (ref 78.0–100.0)
Platelets: 227 10*3/uL (ref 150.0–400.0)
RBC: 3.86 Mil/uL — ABNORMAL LOW (ref 3.87–5.11)
RDW: 13.9 % (ref 11.5–15.5)
WBC: 5.5 10*3/uL (ref 4.0–10.5)

## 2016-12-03 LAB — COMPREHENSIVE METABOLIC PANEL
ALK PHOS: 58 U/L (ref 39–117)
ALT: 17 U/L (ref 0–35)
AST: 18 U/L (ref 0–37)
Albumin: 4.5 g/dL (ref 3.5–5.2)
BUN: 15 mg/dL (ref 6–23)
CHLORIDE: 103 meq/L (ref 96–112)
CO2: 32 mEq/L (ref 19–32)
Calcium: 9.2 mg/dL (ref 8.4–10.5)
Creatinine, Ser: 0.66 mg/dL (ref 0.40–1.20)
GFR: 94.26 mL/min (ref >60.00)
Glucose, Bld: 106 mg/dL — ABNORMAL HIGH (ref 70–99)
POTASSIUM: 3.9 meq/L (ref 3.5–5.1)
Sodium: 140 mEq/L (ref 135–145)
TOTAL PROTEIN: 7 g/dL (ref 6.0–8.3)
Total Bilirubin: 0.5 mg/dL (ref 0.2–1.2)

## 2016-12-03 NOTE — Progress Notes (Signed)
Pre visit review using our clinic review tool, if applicable. No additional management support is needed unless otherwise documented below in the visit note. 

## 2016-12-03 NOTE — Patient Instructions (Signed)
Preventive Care 70 Years and Older, Female Preventive care refers to lifestyle choices and visits with your health care provider that can promote health and wellness. What does preventive care include?  A yearly physical exam. This is also called an annual well check.  Dental exams once or twice a year.  Routine eye exams. Ask your health care provider how often you should have your eyes checked.  Personal lifestyle choices, including:  Daily care of your teeth and gums.  Regular physical activity.  Eating a healthy diet.  Avoiding tobacco and drug use.  Limiting alcohol use.  Practicing safe sex.  Taking low-dose aspirin every day.  Taking vitamin and mineral supplements as recommended by your health care provider. What happens during an annual well check? The services and screenings done by your health care provider during your annual well check will depend on your age, overall health, lifestyle risk factors, and family history of disease. Counseling  Your health care provider may ask you questions about your:  Alcohol use.  Tobacco use.  Drug use.  Emotional well-being.  Home and relationship well-being.  Sexual activity.  Eating habits.  History of falls.  Memory and ability to understand (cognition).  Work and work environment.  Reproductive health. Screening  You may have the following tests or measurements:  Height, weight, and BMI.  Blood pressure.  Lipid and cholesterol levels. These may be checked every 5 years, or more frequently if you are over 50 years old.  Skin check.  Lung cancer screening. You may have this screening every year starting at age 55 if you have a 30-pack-year history of smoking and currently smoke or have quit within the past 15 years.  Fecal occult blood test (FOBT) of the stool. You may have this test every year starting at age 50.  Flexible sigmoidoscopy or colonoscopy. You may have a sigmoidoscopy every 5 years or  a colonoscopy every 10 years starting at age 50.  Hepatitis C blood test.  Hepatitis B blood test.  Sexually transmitted disease (STD) testing.  Diabetes screening. This is done by checking your blood sugar (glucose) after you have not eaten for a while (fasting). You may have this done every 1-3 years.  Bone density scan. This is done to screen for osteoporosis. You may have this done starting at age 65.  Mammogram. This may be done every 1-2 years. Talk to your health care provider about how often you should have regular mammograms. Talk with your health care provider about your test results, treatment options, and if necessary, the need for more tests. Vaccines  Your health care provider may recommend certain vaccines, such as:  Influenza vaccine. This is recommended every year.  Tetanus, diphtheria, and acellular pertussis (Tdap, Td) vaccine. You may need a Td booster every 10 years.  Varicella vaccine. You may need this if you have not been vaccinated.  Zoster vaccine. You may need this after age 60.  Measles, mumps, and rubella (MMR) vaccine. You may need at least one dose of MMR if you were born in 1957 or later. You may also need a second dose.  Pneumococcal 13-valent conjugate (PCV13) vaccine. One dose is recommended after age 65.  Pneumococcal polysaccharide (PPSV23) vaccine. One dose is recommended after age 65.  Meningococcal vaccine. You may need this if you have certain conditions.  Hepatitis A vaccine. You may need this if you have certain conditions or if you travel or work in places where you may be exposed to   hepatitis A.  Hepatitis B vaccine. You may need this if you have certain conditions or if you travel or work in places where you may be exposed to hepatitis B.  Haemophilus influenzae type b (Hib) vaccine. You may need this if you have certain conditions. Talk to your health care provider about which screenings and vaccines you need and how often you need  them. This information is not intended to replace advice given to you by your health care provider. Make sure you discuss any questions you have with your health care provider. Document Released: 11/21/2015 Document Revised: 07/14/2016 Document Reviewed: 08/26/2015 Elsevier Interactive Patient Education  2017 [employer]  

## 2016-12-03 NOTE — Progress Notes (Signed)
Patient ID: DI BAGOT, female    DOB: 22-Jun-1946  Age: 71 y.o. MRN: [id number]    Subjective:  Subjective  HPI Natasha Trujillo presents for annual physical.  No complaints.  Review of Systems  Constitutional: Negative.  Negative for fever.  HENT: Negative.  Negative for congestion.   Eyes: Negative.   Respiratory: Negative.  Negative for shortness of breath.   Cardiovascular: Negative.  Negative for chest pain, palpitations and leg swelling.  Gastrointestinal: Negative for abdominal pain, blood in stool and nausea.  Endocrine: Negative.   Genitourinary: Negative.  Negative for dysuria and frequency.  Skin: Negative for rash.  Allergic/Immunologic: Negative.  Negative for environmental allergies.  Neurological: Negative.  Negative for dizziness and headaches.  Hematological: Negative.   Psychiatric/Behavioral: The patient is not nervous/anxious.     History Past Medical History:  Diagnosis Date  . Hyperlipidemia   . Hypertension   . Osteopenia     She has a past surgical history that includes Tubal ligation; Knee surgery (Left, 09/12/13); and Blepharoplasty (Bilateral, 08/2016).   Her family history includes Cancer (age of onset: 49) in her maternal aunt; Dementia in her mother; Diabetes in her other; Diabetes (age of onset: 91) in her brother; Heart disease in her brother; Heart disease (age of onset: 1) in her father and mother; Hypertension in her brother, father, and mother.She reports that she has never smoked. She has never used smokeless tobacco. She reports that she drinks alcohol. She reports that she does not use drugs.  Current Outpatient Prescriptions on File Prior to Visit  Medication Sig Dispense Refill  . aspirin 81 MG EC tablet Take 81 mg by mouth.      Marland Kitchen atorvastatin (LIPITOR) 20 MG tablet TAKE 1 TABLET EVERY DAY 90 tablet 0  . buPROPion (WELLBUTRIN XL) 150 MG 24 hr tablet TAKE 1 TABLET EVERY MORNING 90 tablet 1  . Calcium 1500 MG tablet Take  1,500 mg by mouth.      . cholecalciferol (VITAMIN D) 1000 UNITS tablet Take 1,000 Units by mouth.      . Diclofenac Sodium (PENNSAID TD) Place 1 application onto the skin daily.    . furosemide (LASIX) 20 MG tablet TAKE 1 TABLET EVERY DAY 90 tablet 1  . Multiple Vitamins-Minerals (MULTIVITAMIN PO) Take 1 tablet by mouth daily.    . Omega-3 Fatty Acids (FISH OIL) 1000 MG CAPS Take 1 capsule by mouth daily.      . valsartan (DIOVAN) 320 MG tablet Take 1 tablet (320 mg total) by mouth daily. 90 tablet 1  . vitamin E 400 UNIT capsule Take 400 Units by mouth.      . loratadine (CLARITIN) 10 MG tablet Take 1 tablet (10 mg total) by mouth daily. (Patient not taking: Reported on 12/03/2016) 90 tablet 3   No current facility-administered medications on file prior to visit.      Objective:  Objective  Physical Exam  Constitutional: She is oriented to person, place, and time. She appears well-developed and well-nourished. No distress.  HENT:  Head: Normocephalic and atraumatic.  Right Ear: External ear normal.  Left Ear: External ear normal.  Nose: Nose normal.  Mouth/Throat: Oropharynx is clear and moist.  Eyes: Conjunctivae and EOM are normal. Pupils are equal, round, and reactive to light.  Neck: Normal range of motion. Neck supple. No JVD present. Carotid bruit is not present. No thyromegaly present.  Cardiovascular: Normal rate, regular rhythm and normal heart sounds.   No murmur heard. Pulmonary/Chest:  Effort normal and breath sounds normal. No respiratory distress. She has no wheezes. She has no rales. She exhibits no tenderness.  Musculoskeletal: She exhibits no edema.  Neurological: She is alert and oriented to person, place, and time.  Psychiatric: She has a normal mood and affect. Her behavior is normal. Judgment and thought content normal.  Nursing note and vitals reviewed.  BP 132/80 (BP Location: Left Arm, Patient Position: Sitting, Cuff Size: Normal)   Pulse 80   Temp 97.7 F  (36.5 C) (Oral)   Resp 16   Ht 5\' 2"  (1.575 m)   Wt 121 lb 6.4 oz (55.1 kg)   SpO2 97%   BMI 22.20 kg/m  Wt Readings from Last 3 Encounters:  12/03/16 121 lb 6.4 oz (55.1 kg)  08/09/16 118 lb (53.5 kg)  05/18/16 118 lb (53.5 kg)     Lab Results  Component Value Date   WBC 5.5 12/03/2016   HGB 12.3 12/03/2016   HCT 36.2 12/03/2016   PLT 227.0 12/03/2016   GLUCOSE 106 (H) 12/03/2016   CHOL 158 12/03/2016   TRIG 38.0 12/03/2016   HDL 75.00 12/03/2016   LDLCALC 75 12/03/2016   ALT 17 12/03/2016   AST 18 12/03/2016   NA 140 12/03/2016   K 3.9 12/03/2016   CL 103 12/03/2016   CREATININE 0.66 12/03/2016   BUN 15 12/03/2016   CO2 32 12/03/2016   TSH 2.04 10/28/2015   HGBA1C 5.7 05/28/2016   MICROALBUR 0.50 05/10/2013    No results found.   Assessment & Plan:  Plan  I am having Natasha Trujillo maintain her aspirin, Calcium, Fish Oil, vitamin E, cholecalciferol, Multiple Vitamins-Minerals (MULTIVITAMIN PO), Diclofenac Sodium (PENNSAID TD), loratadine, furosemide, valsartan, atorvastatin, and buPROPion.  No orders of the defined types were placed in this encounter.   Problem List Items Addressed This Visit      Unprioritized   Essential hypertension   Relevant Orders   Comprehensive metabolic panel (Completed)   CBC (Completed)   POCT Urinalysis Dipstick (Automated) (Completed)   Preventative health care - Primary    See AVS Check labs ghm utd       Other Visit Diagnoses    Hyperlipidemia, unspecified hyperlipidemia type       Relevant Orders   Lipid panel (Completed)   POCT Urinalysis Dipstick (Automated) (Completed)   Suspicious nevus       Relevant Orders   Ambulatory referral to Dermatology      Follow-up: Return in about 6 months (around 06/02/2017), or medicare wellness with RN.  Ann Held, DO

## 2016-12-05 DIAGNOSIS — Z0000 Encounter for general adult medical examination without abnormal findings: Secondary | ICD-10-CM | POA: Insufficient documentation

## 2016-12-05 NOTE — Assessment & Plan Note (Signed)
See AVS Check labs  ghm utd 

## 2016-12-06 DIAGNOSIS — M25511 Pain in right shoulder: Secondary | ICD-10-CM | POA: Diagnosis not present

## 2016-12-06 DIAGNOSIS — M7551 Bursitis of right shoulder: Secondary | ICD-10-CM | POA: Diagnosis not present

## 2016-12-08 ENCOUNTER — Encounter: Payer: Self-pay | Admitting: Family Medicine

## 2016-12-08 DIAGNOSIS — M7551 Bursitis of right shoulder: Secondary | ICD-10-CM | POA: Diagnosis not present

## 2016-12-08 DIAGNOSIS — M25511 Pain in right shoulder: Secondary | ICD-10-CM | POA: Diagnosis not present

## 2016-12-08 NOTE — Telephone Encounter (Signed)
Why was it cancelled--- if wellness it can be done by RN--- I don't know anything about this--- who cancelled it?

## 2016-12-09 ENCOUNTER — Other Ambulatory Visit: Payer: Self-pay | Admitting: Family Medicine

## 2016-12-09 ENCOUNTER — Encounter: Payer: Self-pay | Admitting: Family Medicine

## 2016-12-09 ENCOUNTER — Ambulatory Visit
Admission: RE | Admit: 2016-12-09 | Discharge: 2016-12-09 | Disposition: A | Discharge status: Home / Self Care | Payer: Medicare HMO | Source: Ambulatory Visit | Attending: Orthopedic Surgery | Admitting: Orthopedic Surgery

## 2016-12-09 ENCOUNTER — Ambulatory Visit (INDEPENDENT_AMBULATORY_CARE_PROVIDER_SITE_OTHER): Payer: Medicare HMO | Admitting: Family Medicine

## 2016-12-09 ENCOUNTER — Ambulatory Visit (HOSPITAL_BASED_OUTPATIENT_CLINIC_OR_DEPARTMENT_OTHER)
Admission: RE | Admit: 2016-12-09 | Discharge: 2016-12-09 | Disposition: A | Discharge status: Home / Self Care | Payer: Medicare HMO | Source: Ambulatory Visit | Attending: Family Medicine | Admitting: Family Medicine

## 2016-12-09 ENCOUNTER — Telehealth: Payer: Self-pay | Admitting: *Deleted

## 2016-12-09 VITALS — BP 149/77 | HR 74 | Temp 98.0°F | Ht 62.0 in

## 2016-12-09 DIAGNOSIS — M19071 Primary osteoarthritis, right ankle and foot: Secondary | ICD-10-CM | POA: Insufficient documentation

## 2016-12-09 DIAGNOSIS — M79671 Pain in right foot: Secondary | ICD-10-CM | POA: Diagnosis not present

## 2016-12-09 DIAGNOSIS — M25511 Pain in right shoulder: Secondary | ICD-10-CM | POA: Diagnosis not present

## 2016-12-09 DIAGNOSIS — S99921A Unspecified injury of right foot, initial encounter: Secondary | ICD-10-CM | POA: Diagnosis not present

## 2016-12-09 DIAGNOSIS — M25519 Pain in unspecified shoulder: Secondary | ICD-10-CM

## 2016-12-09 NOTE — Telephone Encounter (Signed)
Someone please look into this

## 2016-12-09 NOTE — Telephone Encounter (Signed)
Ok to put referral in for Dr French Ana

## 2016-12-09 NOTE — Telephone Encounter (Signed)
Provided clarification to pt to let her know that her AWV was still sched for 05/27/17 @9 .

## 2016-12-09 NOTE — Patient Instructions (Addendum)
Ice/cold for 10-15 min every 2-3 hours when awake. OK to try to use heat after 3 days if feeling stiff.  Activity as tolerated.  Ibuprofen and Tylenol for pain.  If the radiologist sees anything that I missed, we will contact you.

## 2016-12-09 NOTE — Progress Notes (Signed)
Musculoskeletal Exam  Patient: Natasha Trujillo DOB: 07/23/1946  DOS: 12/09/2016  SUBJECTIVE:  Chief Complaint:   Chief Complaint  Patient presents with  . Foot Injury    fell down stairs last night-(R) foot swollen and painful to put any pressure on it.    Natasha Trujillo is a 71 y.o.  female for evaluation and treatment of R foot pain.   Onset:  1 day ago. She jumped down from a ledge while racing her cat; landed on her R foot and felt pain.  Location: Dorsum of R foot Character:  sharp  Progression of issue:  is unchanged Associated symptoms: Bruising, swelling Treatment: to date has been rest and crutches.   Neurovascular symptoms: no   ROS: Musculoskeletal/Extremities: +foot pain Neurologic: no numbness, tingling no weakness   Past Medical History:  Diagnosis Date  . Hyperlipidemia   . Hypertension   . Osteopenia    Past Surgical History:  Procedure Laterality Date  . BLEPHAROPLASTY Bilateral 08/2016   Dr Shelbie Proctor  . KNEE SURGERY Left 09/12/13  . TUBAL LIGATION     Family History  Problem Relation Age of Onset  . Coronary artery disease      female 1st degree relative <50/female 1st degree relative <60  . Cervical cancer    . Lung cancer    . Dementia Mother   . Hypertension Mother   . Heart disease Mother 35    CABG  . Hypertension Father   . Heart disease Father 43    MI  . Diabetes Brother 16    type 1  . Hypertension Brother   . Heart disease Brother     cabg  . Cancer Maternal Aunt 68    cervical  . Diabetes Other     committed suicide  . Colon cancer Neg Hx    Current Outpatient Prescriptions  Medication Sig Dispense Refill  . aspirin 81 MG EC tablet Take 81 mg by mouth.      Marland Kitchen atorvastatin (LIPITOR) 20 MG tablet TAKE 1 TABLET EVERY DAY 90 tablet 0  . buPROPion (WELLBUTRIN XL) 150 MG 24 hr tablet TAKE 1 TABLET EVERY MORNING 90 tablet 1  . Calcium 1500 MG tablet Take 1,500 mg by mouth.      . cholecalciferol (VITAMIN D) 1000  UNITS tablet Take 1,000 Units by mouth.      . Diclofenac Sodium (PENNSAID TD) Place 1 application onto the skin daily.    . furosemide (LASIX) 20 MG tablet TAKE 1 TABLET EVERY DAY 90 tablet 1  . Multiple Vitamins-Minerals (MULTIVITAMIN PO) Take 1 tablet by mouth daily.    . Omega-3 Fatty Acids (FISH OIL) 1000 MG CAPS Take 1 capsule by mouth daily.      . valsartan (DIOVAN) 320 MG tablet Take 1 tablet (320 mg total) by mouth daily. 90 tablet 1  . vitamin E 400 UNIT capsule Take 400 Units by mouth.       Objective: VITAL SIGNS: BP (!) 149/77 (BP Location: Left Arm, Patient Position: Sitting, Cuff Size: Normal)   Pulse 74   Temp 98 F (36.7 C) (Oral)   Ht 5\' 2"  (1.575 m)   SpO2 100%  Constitutional: Well formed, well developed. No acute distress. Cardiovascular: Brisk cap refill Thorax & Lungs: No accessory muscle use Extremities: No clubbing. No cyanosis. No edema.  Skin: Warm. Dry. No erythema. No rash.  Musculoskeletal: R foot.   Tenderness to palpation: yes over 1st and 2nd MT Deformity: no  Ecchymosis: yes Swelling Tests positive: None Tests negative: Squeeze, anterior drawer of ankle Neurologic: Normal sensory function.  Psychiatric: Normal mood. Age appropriate judgment and insight. Alert & oriented x 3.    Assessment:  Right foot pain - Plan: DG Foot Complete Right  Plan: Orders as above. XR unofficially shows no acute fx or dislocation, await final read. Ice/cold, ibuprofen, Tylenol, weight bearing as able. She has crutches. Surgical shoe given. F/u prn. The patient voiced understanding and agreement to the plan.   [place], DO 12/09/16  4:22 PM

## 2016-12-10 NOTE — Telephone Encounter (Signed)
See phone note dated for 12/09/16.

## 2016-12-16 DIAGNOSIS — M25511 Pain in right shoulder: Secondary | ICD-10-CM | POA: Diagnosis not present

## 2016-12-24 ENCOUNTER — Encounter: Payer: Self-pay | Admitting: Family Medicine

## 2016-12-24 ENCOUNTER — Ambulatory Visit (HOSPITAL_BASED_OUTPATIENT_CLINIC_OR_DEPARTMENT_OTHER)
Admission: RE | Admit: 2016-12-24 | Discharge: 2016-12-24 | Disposition: A | Discharge status: Home / Self Care | Payer: Medicare HMO | Source: Ambulatory Visit | Attending: Family Medicine | Admitting: Family Medicine

## 2016-12-24 ENCOUNTER — Ambulatory Visit (INDEPENDENT_AMBULATORY_CARE_PROVIDER_SITE_OTHER): Payer: Medicare HMO | Admitting: Family Medicine

## 2016-12-24 VITALS — BP 130/68 | HR 78 | Temp 98.9°F | Resp 16 | Ht 62.0 in | Wt 119.4 lb

## 2016-12-24 DIAGNOSIS — M7989 Other specified soft tissue disorders: Secondary | ICD-10-CM | POA: Diagnosis not present

## 2016-12-24 DIAGNOSIS — Z23 Encounter for immunization: Secondary | ICD-10-CM | POA: Diagnosis not present

## 2016-12-24 DIAGNOSIS — S96911D Strain of unspecified muscle and tendon at ankle and foot level, right foot, subsequent encounter: Secondary | ICD-10-CM

## 2016-12-24 DIAGNOSIS — M25471 Effusion, right ankle: Secondary | ICD-10-CM | POA: Insufficient documentation

## 2016-12-24 DIAGNOSIS — M19071 Primary osteoarthritis, right ankle and foot: Secondary | ICD-10-CM | POA: Insufficient documentation

## 2016-12-24 DIAGNOSIS — M79671 Pain in right foot: Secondary | ICD-10-CM | POA: Diagnosis not present

## 2016-12-24 MED ORDER — ZOSTER VAC RECOMB ADJUVANTED 50 MCG/0.5ML IM SUSR: 50.0000 ug | Freq: Once | INTRAMUSCULAR | 1 refills | Status: AC

## 2016-12-24 NOTE — Patient Instructions (Signed)
Foot Sprain Introduction A foot sprain is an injury to one of the strong bands of tissue (ligaments) that connect and support the many bones in your feet. The ligament can be stretched too much or it can tear. A tear can be either partial or complete. The severity of the sprain depends on how much of the ligament was damaged or torn. What are the causes? A foot sprain is usually caused by suddenly twisting or pivoting your foot. What increases the risk? This injury is more likely to occur in people who:  Play a sport, such as basketball or football.  Exercise or play a sport without warming up.  Start a new workout or sport.  Suddenly increase how long or hard they exercise or play a sport. What are the signs or symptoms? Symptoms of this condition start soon after an injury and include:  Pain, especially in the arch of the foot.  Bruising.  Swelling.  Inability to walk or use the foot to support body weight. How is this diagnosed? This condition is diagnosed with a medical history and physical exam. You may also have imaging tests, such as:  X-rays to make sure there are no broken bones (fractures).  MRI to see if the ligament has torn. How is this treated? Treatment varies depending on the severity of your sprain. Mild sprains can be treated with rest, ice, compression, and elevation (RICE). If your ligament is overstretched or partially torn, treatment usually involves keeping your foot in a fixed position (immobilization) for a period of time. To help you do this, your health care provider will apply a bandage, splint, or walking boot to keep your foot from moving until it heals. You may also be advised to use crutches or a scooter for a few weeks to avoid bearing weight on your foot while it is healing. If your ligament is fully torn, you may need surgery to reconnect the ligament to the bone. After surgery, a cast or splint will be applied and will need to stay on your foot  while it heals. Your health care provider may also suggest exercises or physical therapy to strengthen your foot. Follow these instructions at home: If You Have a Bandage, Splint, or Walking Boot:  Wear it as directed by your health care provider. Remove it only as directed by your health care provider.  Loosen the bandage, splint, or walking boot if your toes become numb and tingle, or if they turn cold and blue. Bathing  If your health care provider approves bathing and showering, cover the bandage or splint with a watertight plastic bag to protect it from water. Do not let the bandage or splint get wet. Managing pain, stiffness, and swelling  If directed, apply ice to the injured area:  Put ice in a plastic bag.  Place a towel between your skin and the bag.  Leave the ice on for 20 minutes, 2-3 times per day.  Move your toes often to avoid stiffness and to lessen swelling.  Raise (elevate) the injured area above the level of your heart while you are sitting or lying down. Driving  Do not drive or operate heavy machinery while taking pain medicine.  Ask your health care provider when it is safe to drive if you have a bandage, splint, or walking boot on your foot. Activity  Rest as directed by your health care provider.  Do not use the injured foot to support your body weight until your health care provider  says that you can. Use crutches or other supportive devices as directed by your health care provider.  Ask your health care provider what activities are safe for you. Gradually increase how much and how far you walk until your health care provider says it is safe to return to full activity.  Do any exercise or physical therapy as directed by your health care provider. General instructions  If a splint was applied, do not put pressure on any part of it until it is fully hardened. This may take several hours.  Take medicines only as directed by your health care provider.  These include over-the-counter medicines and prescription medicines.  Keep all follow-up visits as directed by your health care provider. This is important.  When you can walk without pain, wear supportive shoes that have stiff soles. Do not wear flip-flops, and do not walk barefoot. Contact a health care provider if:  Your pain is not controlled with medicine.  Your bruising or swelling gets worse or does not get better with treatment.  Your splint or walking boot is damaged. Get help right away if:  You develop severe numbness or tingling in your foot.  Your foot turns blue, white, or gray, and it feels cold. This information is not intended to replace advice given to you by your health care provider. Make sure you discuss any questions you have with your health care provider. Document Released: 04/16/2002 Document Revised: 04/01/2016 Document Reviewed: 08/28/2014  2017 [employer]

## 2016-12-24 NOTE — Progress Notes (Signed)
Subjective:    Patient ID: Natasha Trujillo, female    DOB: 09/03/1946, 71 y.o.   MRN: [id number]   I acted as a Education administrator for Dr. Royden Purl, LPN   Chief Complaint  Patient presents with  . Acute Visit    HPI  Patient is in today for c/o swollen right foot. Patient report she was seen for this same issue 2 weeks ago and still no relief.  Past Medical History:  Diagnosis Date  . Hyperlipidemia   . Hypertension   . Osteopenia     Past Surgical History:  Procedure Laterality Date  . BLEPHAROPLASTY Bilateral 08/2016   Dr Shelbie Proctor  . KNEE SURGERY Left 09/12/13  . TUBAL LIGATION      Family History  Problem Relation Age of Onset  . Coronary artery disease      female 1st degree relative <50/female 1st degree relative <60  . Cervical cancer    . Lung cancer    . Dementia Mother   . Hypertension Mother   . Heart disease Mother 51    CABG  . Hypertension Father   . Heart disease Father 58    MI  . Diabetes Brother 16    type 1  . Hypertension Brother   . Heart disease Brother     cabg  . Cancer Maternal Aunt 68    cervical  . Diabetes Other     committed suicide  . Colon cancer Neg Hx     Social History   Social History  . Marital status: Married    Spouse name: N/A  . Number of children: N/A  . Years of education: N/A   Occupational History  . retired Retired   Social History Main Topics  . Smoking status: Never Smoker  . Smokeless tobacco: Never Used  . Alcohol use Yes     Comment: occasional  . Drug use: No  . Sexual activity: Yes    Partners: Male   Other Topics Concern  . Not on file   Social History Narrative   Exercise--walk 5 miles 5 days a week    Outpatient Medications Prior to Visit  Medication Sig Dispense Refill  . aspirin 81 MG EC tablet Take 81 mg by mouth.      Marland Kitchen atorvastatin (LIPITOR) 20 MG tablet TAKE 1 TABLET EVERY DAY 90 tablet 0  . buPROPion (WELLBUTRIN XL) 150 MG 24 hr tablet TAKE 1 TABLET EVERY MORNING 90  tablet 1  . Calcium 1500 MG tablet Take 1,500 mg by mouth.      . cholecalciferol (VITAMIN D) 1000 UNITS tablet Take 1,000 Units by mouth.      . Diclofenac Sodium (PENNSAID TD) Place 1 application onto the skin daily.    . furosemide (LASIX) 20 MG tablet TAKE 1 TABLET EVERY DAY 90 tablet 1  . Multiple Vitamins-Minerals (MULTIVITAMIN PO) Take 1 tablet by mouth daily.    . Omega-3 Fatty Acids (FISH OIL) 1000 MG CAPS Take 1 capsule by mouth daily.      . valsartan (DIOVAN) 320 MG tablet Take 1 tablet (320 mg total) by mouth daily. 90 tablet 1  . vitamin E 400 UNIT capsule Take 400 Units by mouth.       No facility-administered medications prior to visit.     Allergies  Allergen Reactions  . Adrenalin [Epinephrine] Other (See Comments)    dizziness  . Lisinopril Cough    Review of Systems  Constitutional: Negative for fever.  HENT: Negative for congestion.   Eyes: Negative for blurred vision.  Respiratory: Negative for cough.   Cardiovascular: Negative for chest pain and palpitations.  Gastrointestinal: Negative for vomiting.  Musculoskeletal: Positive for joint pain. Negative for back pain.  Skin: Negative for rash.  Neurological: Negative for loss of consciousness and headaches.       Objective:    Physical Exam  Constitutional: She is oriented to person, place, and time. She appears well-developed and well-nourished.  HENT:  Head: Normocephalic and atraumatic.  Eyes: Conjunctivae and EOM are normal.  Neck: Normal range of motion. Neck supple. No JVD present. Carotid bruit is not present. No thyromegaly present.  Cardiovascular: Normal rate, regular rhythm and normal heart sounds.   No murmur heard. Pulmonary/Chest: Effort normal and breath sounds normal. No respiratory distress. She has no wheezes. She has no rales. She exhibits no tenderness.  Musculoskeletal: She exhibits edema and tenderness.       Feet:  Neurological: She is alert and oriented to person, place, and  time.  Psychiatric: She has a normal mood and affect.  Nursing note and vitals reviewed.   BP 130/68 (BP Location: Left Arm, Patient Position: Sitting, Cuff Size: Normal)   Pulse 78   Temp 98.9 F (37.2 C) (Oral)   Resp 16   Ht 5\' 2"  (1.575 m)   Wt 119 lb 6.4 oz (54.2 kg)   SpO2 97%   BMI 21.84 kg/m  Wt Readings from Last 3 Encounters:  12/27/16 116 lb (52.6 kg)  12/24/16 119 lb 6.4 oz (54.2 kg)  12/03/16 121 lb 6.4 oz (55.1 kg)     Lab Results  Component Value Date   WBC 5.5 12/03/2016   HGB 12.3 12/03/2016   HCT 36.2 12/03/2016   PLT 227.0 12/03/2016   GLUCOSE 106 (H) 12/03/2016   CHOL 158 12/03/2016   TRIG 38.0 12/03/2016   HDL 75.00 12/03/2016   LDLCALC 75 12/03/2016   ALT 17 12/03/2016   AST 18 12/03/2016   NA 140 12/03/2016   K 3.9 12/03/2016   CL 103 12/03/2016   CREATININE 0.66 12/03/2016   BUN 15 12/03/2016   CO2 32 12/03/2016   TSH 2.04 10/28/2015   HGBA1C 5.7 05/28/2016   MICROALBUR 0.50 05/10/2013    Lab Results  Component Value Date   TSH 2.04 10/28/2015   Lab Results  Component Value Date   WBC 5.5 12/03/2016   HGB 12.3 12/03/2016   HCT 36.2 12/03/2016   MCV 93.9 12/03/2016   PLT 227.0 12/03/2016   Lab Results  Component Value Date   NA 140 12/03/2016   K 3.9 12/03/2016   CO2 32 12/03/2016   GLUCOSE 106 (H) 12/03/2016   BUN 15 12/03/2016   CREATININE 0.66 12/03/2016   BILITOT 0.5 12/03/2016   ALKPHOS 58 12/03/2016   AST 18 12/03/2016   ALT 17 12/03/2016   PROT 7.0 12/03/2016   ALBUMIN 4.5 12/03/2016   CALCIUM 9.2 12/03/2016   GFR 94.26 12/03/2016   Lab Results  Component Value Date   CHOL 158 12/03/2016   Lab Results  Component Value Date   HDL 75.00 12/03/2016   Lab Results  Component Value Date   LDLCALC 75 12/03/2016   Lab Results  Component Value Date   TRIG 38.0 12/03/2016   Lab Results  Component Value Date   CHOLHDL 2 12/03/2016   Lab Results  Component Value Date   HGBA1C 5.7 05/28/2016  Assessment & Plan:   Problem List Items Addressed This Visit      Unprioritized   Strain of right foot - Primary    Check xray Consider sport med if no improvement over next 1-2 weeks  Elevate,, ice Ace rto prn      Relevant Orders   DG Foot Complete Right (Completed)    Other Visit Diagnoses    Need for shingles vaccine          I am having Ms. Furlough start on Zoster Vac Recomb Adjuvanted. I am also having her maintain her aspirin, Calcium, Fish Oil, vitamin E, cholecalciferol, Multiple Vitamins-Minerals (MULTIVITAMIN PO), Diclofenac Sodium (PENNSAID TD), furosemide, valsartan, atorvastatin, and buPROPion.  Meds ordered this encounter  Medications  . Zoster Vac Recomb Adjuvanted (SHINGRIX) 50 MCG SUSR    Sig: Inject 50 mcg into the muscle once.    Dispense:  1 each    Refill:  1    CMA served as scribe during this visit. History, Physical and Plan performed by medical provider. Documentation and orders reviewed and attested to.  Ann Held, DO  Patient ID: KAMONI ARMANI, female   DOB: 01-28-1946, 71 y.o.   MRN: [id number]

## 2016-12-24 NOTE — Progress Notes (Signed)
Pre visit review using our clinic review tool, if applicable. No additional management support is needed unless otherwise documented below in the visit note. 

## 2016-12-27 ENCOUNTER — Ambulatory Visit (INDEPENDENT_AMBULATORY_CARE_PROVIDER_SITE_OTHER): Payer: Medicare HMO | Admitting: Family Medicine

## 2016-12-27 ENCOUNTER — Encounter: Payer: Self-pay | Admitting: Family Medicine

## 2016-12-27 ENCOUNTER — Other Ambulatory Visit: Payer: Self-pay | Admitting: Family Medicine

## 2016-12-27 DIAGNOSIS — M79671 Pain in right foot: Secondary | ICD-10-CM

## 2016-12-27 DIAGNOSIS — S96911A Strain of unspecified muscle and tendon at ankle and foot level, right foot, initial encounter: Secondary | ICD-10-CM

## 2016-12-27 NOTE — Patient Instructions (Signed)
You have a severe foot sprain. Ice the area 15 minutes at a time 3-4 times a day. Use short boot OR well supported shoes (like what you're wearing now) when up and walking around the next 4 weeks. Ibuprofen or aleve if needed for pain and inflammation. Elevate above your heart as needed for swelling. Unfortunately I wouldn't expect physical therapy to provide much benefit for this. Follow up with me in 4 weeks. These usually take 6-8 weeks to resolve. Activities as tolerated otherwise.

## 2016-12-28 ENCOUNTER — Ambulatory Visit: Payer: Medicare HMO | Admitting: Family Medicine

## 2016-12-29 DIAGNOSIS — M79671 Pain in right foot: Secondary | ICD-10-CM | POA: Insufficient documentation

## 2016-12-29 NOTE — Progress Notes (Signed)
PCP and consultation requested by: Ann Held, DO  Subjective:   HPI: Patient is a 71 y.o. female here for right foot pain.  Patient reports about 2 1/2 weeks ago she was coming down the steps when wearing slippers. Accidentally missed the last 2-3 steps on hardware floor and landed hard planting right foot then falling forward. Could bear weight initially but developed swelling after this. Has been icing, elevating, resting. Pain dorsal foot but goes into heel and arch. Pain worse with walking to 7/10 level, sharp. No skin changes, numbness.  Past Medical History:  Diagnosis Date  . Hyperlipidemia   . Hypertension   . Osteopenia     Current Outpatient Prescriptions on File Prior to Visit  Medication Sig Dispense Refill  . aspirin 81 MG EC tablet Take 81 mg by mouth.      Marland Kitchen atorvastatin (LIPITOR) 20 MG tablet TAKE 1 TABLET EVERY DAY 90 tablet 0  . buPROPion (WELLBUTRIN XL) 150 MG 24 hr tablet TAKE 1 TABLET EVERY MORNING 90 tablet 1  . Calcium 1500 MG tablet Take 1,500 mg by mouth.      . cholecalciferol (VITAMIN D) 1000 UNITS tablet Take 1,000 Units by mouth.      . Diclofenac Sodium (PENNSAID TD) Place 1 application onto the skin daily.    . furosemide (LASIX) 20 MG tablet TAKE 1 TABLET EVERY DAY 90 tablet 1  . Multiple Vitamins-Minerals (MULTIVITAMIN PO) Take 1 tablet by mouth daily.    . Omega-3 Fatty Acids (FISH OIL) 1000 MG CAPS Take 1 capsule by mouth daily.      . valsartan (DIOVAN) 320 MG tablet Take 1 tablet (320 mg total) by mouth daily. 90 tablet 1  . vitamin E 400 UNIT capsule Take 400 Units by mouth.       No current facility-administered medications on file prior to visit.     Past Surgical History:  Procedure Laterality Date  . BLEPHAROPLASTY Bilateral 08/2016   Dr Shelbie Proctor  . KNEE SURGERY Left 09/12/13  . TUBAL LIGATION      Allergies  Allergen Reactions  . Adrenalin [Epinephrine] Other (See Comments)    dizziness  . Lisinopril Cough     Social History   Social History  . Marital status: Married    Spouse name: N/A  . Number of children: N/A  . Years of education: N/A   Occupational History  . retired Retired   Social History Main Topics  . Smoking status: Never Smoker  . Smokeless tobacco: Never Used  . Alcohol use Yes     Comment: occasional  . Drug use: No  . Sexual activity: Yes    Partners: Male   Other Topics Concern  . Not on file   Social History Narrative   Exercise--walk 5 miles 5 days a week    Family History  Problem Relation Age of Onset  . Coronary artery disease      female 1st degree relative <50/female 1st degree relative <60  . Cervical cancer    . Lung cancer    . Dementia Mother   . Hypertension Mother   . Heart disease Mother 59    CABG  . Hypertension Father   . Heart disease Father 54    MI  . Diabetes Brother 16    type 1  . Hypertension Brother   . Heart disease Brother     cabg  . Cancer Maternal Aunt 68    cervical  .  Diabetes Other     committed suicide  . Colon cancer Neg Hx     BP (!) 162/95   Pulse 71   Ht 5\' 2"  (1.575 m)   Wt 116 lb (52.6 kg)   BMI 21.22 kg/m   Review of Systems: See HPI above.     Objective:  Physical Exam:  Gen: NAD, comfortable in exam room  Right foot/ankle: No gross deformity, swelling, ecchymoses FROM with 5/5 strength all directions. TTP base 4th metatarsal and TMT joint.  No other tenderness of foot or ankle. Negative ant drawer and talar tilt.   Negative syndesmotic compression. Negative metatarsal squeeze. Thompsons test negative. NV intact distally.  Left foot/ankle: FROM without pain.   Assessment & Plan:  1. Right foot injury - independently reviewed radiographs.  No evidence fracture or lis franc injury (location of tenderness and mechanism of injury do not fit with latter either).  Ultrasound without evidence cortical irregularity, other concerning findings.  2/2 severe foot sprain.  Reassured.  Icing,  short boot discussed for when up and walking around but can switch to supportive shoe when tolerated.  Ibuprofen or aleve if needed.  Elevation.  F/u in 4 weeks.  Activities as tolerated.

## 2016-12-29 NOTE — Assessment & Plan Note (Signed)
independently reviewed radiographs.  No evidence fracture or lis franc injury (location of tenderness and mechanism of injury do not fit with latter either).  Ultrasound without evidence cortical irregularity, other concerning findings.  2/2 severe foot sprain.  Reassured.  Icing, short boot discussed for when up and walking around but can switch to supportive shoe when tolerated.  Ibuprofen or aleve if needed.  Elevation.  F/u in 4 weeks.  Activities as tolerated.

## 2017-01-03 DIAGNOSIS — S96911A Strain of unspecified muscle and tendon at ankle and foot level, right foot, initial encounter: Secondary | ICD-10-CM | POA: Insufficient documentation

## 2017-01-03 NOTE — Assessment & Plan Note (Signed)
Check xray Consider sport med if no improvement over next 1-2 weeks  Elevate,, ice Ace rto prn

## 2017-01-05 DIAGNOSIS — M75121 Complete rotator cuff tear or rupture of right shoulder, not specified as traumatic: Secondary | ICD-10-CM | POA: Diagnosis not present

## 2017-01-05 DIAGNOSIS — M19011 Primary osteoarthritis, right shoulder: Secondary | ICD-10-CM | POA: Diagnosis not present

## 2017-01-05 DIAGNOSIS — M7541 Impingement syndrome of right shoulder: Secondary | ICD-10-CM | POA: Diagnosis not present

## 2017-01-05 DIAGNOSIS — G8918 Other acute postprocedural pain: Secondary | ICD-10-CM | POA: Diagnosis not present

## 2017-01-05 DIAGNOSIS — M24011 Loose body in right shoulder: Secondary | ICD-10-CM | POA: Diagnosis not present

## 2017-01-05 DIAGNOSIS — M24111 Other articular cartilage disorders, right shoulder: Secondary | ICD-10-CM | POA: Diagnosis not present

## 2017-01-05 DIAGNOSIS — M7521 Bicipital tendinitis, right shoulder: Secondary | ICD-10-CM | POA: Diagnosis not present

## 2017-01-05 HISTORY — PX: SHOULDER SURGERY: SHX246

## 2017-01-10 DIAGNOSIS — M25511 Pain in right shoulder: Secondary | ICD-10-CM | POA: Diagnosis not present

## 2017-01-10 DIAGNOSIS — M75121 Complete rotator cuff tear or rupture of right shoulder, not specified as traumatic: Secondary | ICD-10-CM | POA: Diagnosis not present

## 2017-01-10 DIAGNOSIS — M25611 Stiffness of right shoulder, not elsewhere classified: Secondary | ICD-10-CM | POA: Diagnosis not present

## 2017-01-11 DIAGNOSIS — M25511 Pain in right shoulder: Secondary | ICD-10-CM | POA: Diagnosis not present

## 2017-01-13 DIAGNOSIS — M25511 Pain in right shoulder: Secondary | ICD-10-CM | POA: Diagnosis not present

## 2017-01-13 DIAGNOSIS — M25611 Stiffness of right shoulder, not elsewhere classified: Secondary | ICD-10-CM | POA: Diagnosis not present

## 2017-01-13 DIAGNOSIS — M75121 Complete rotator cuff tear or rupture of right shoulder, not specified as traumatic: Secondary | ICD-10-CM | POA: Diagnosis not present

## 2017-01-14 DIAGNOSIS — M25611 Stiffness of right shoulder, not elsewhere classified: Secondary | ICD-10-CM | POA: Diagnosis not present

## 2017-01-14 DIAGNOSIS — M75121 Complete rotator cuff tear or rupture of right shoulder, not specified as traumatic: Secondary | ICD-10-CM | POA: Diagnosis not present

## 2017-01-14 DIAGNOSIS — M25511 Pain in right shoulder: Secondary | ICD-10-CM | POA: Diagnosis not present

## 2017-01-17 DIAGNOSIS — M25611 Stiffness of right shoulder, not elsewhere classified: Secondary | ICD-10-CM | POA: Diagnosis not present

## 2017-01-17 DIAGNOSIS — M75121 Complete rotator cuff tear or rupture of right shoulder, not specified as traumatic: Secondary | ICD-10-CM | POA: Diagnosis not present

## 2017-01-17 DIAGNOSIS — M25511 Pain in right shoulder: Secondary | ICD-10-CM | POA: Diagnosis not present

## 2017-01-19 DIAGNOSIS — M25511 Pain in right shoulder: Secondary | ICD-10-CM | POA: Diagnosis not present

## 2017-01-19 DIAGNOSIS — M75121 Complete rotator cuff tear or rupture of right shoulder, not specified as traumatic: Secondary | ICD-10-CM | POA: Diagnosis not present

## 2017-01-19 DIAGNOSIS — M25611 Stiffness of right shoulder, not elsewhere classified: Secondary | ICD-10-CM | POA: Diagnosis not present

## 2017-01-20 ENCOUNTER — Other Ambulatory Visit: Payer: Self-pay | Admitting: Family Medicine

## 2017-01-20 DIAGNOSIS — I10 Essential (primary) hypertension: Secondary | ICD-10-CM

## 2017-01-20 DIAGNOSIS — R609 Edema, unspecified: Secondary | ICD-10-CM

## 2017-01-21 DIAGNOSIS — M25611 Stiffness of right shoulder, not elsewhere classified: Secondary | ICD-10-CM | POA: Diagnosis not present

## 2017-01-21 DIAGNOSIS — M25511 Pain in right shoulder: Secondary | ICD-10-CM | POA: Diagnosis not present

## 2017-01-21 DIAGNOSIS — M75121 Complete rotator cuff tear or rupture of right shoulder, not specified as traumatic: Secondary | ICD-10-CM | POA: Diagnosis not present

## 2017-01-24 ENCOUNTER — Encounter: Payer: Self-pay | Admitting: Family Medicine

## 2017-01-24 ENCOUNTER — Ambulatory Visit (INDEPENDENT_AMBULATORY_CARE_PROVIDER_SITE_OTHER): Payer: Medicare HMO | Admitting: Family Medicine

## 2017-01-24 DIAGNOSIS — M75121 Complete rotator cuff tear or rupture of right shoulder, not specified as traumatic: Secondary | ICD-10-CM | POA: Diagnosis not present

## 2017-01-24 DIAGNOSIS — M25611 Stiffness of right shoulder, not elsewhere classified: Secondary | ICD-10-CM | POA: Diagnosis not present

## 2017-01-24 DIAGNOSIS — M79671 Pain in right foot: Secondary | ICD-10-CM | POA: Diagnosis not present

## 2017-01-24 DIAGNOSIS — M25511 Pain in right shoulder: Secondary | ICD-10-CM | POA: Diagnosis not present

## 2017-01-24 NOTE — Patient Instructions (Signed)
You have a severe foot sprain. Ice the area 15 minutes at a time 3-4 times a day. Use supportive shoes when up and walking around. Avoid flat shoes and barefoot walking. Ibuprofen or aleve if needed for pain and inflammation. Elevate above your heart as needed for swelling. Unfortunately I wouldn't expect physical therapy to provide much benefit for this. These usually take 6-8 weeks to resolve. Arch binder for support unless this causes pain. Activities as tolerated otherwise. Call me if still struggling 4 weeks from now and would consider an MRI at that time.

## 2017-01-26 ENCOUNTER — Other Ambulatory Visit: Payer: Self-pay | Admitting: Family Medicine

## 2017-01-26 DIAGNOSIS — M25611 Stiffness of right shoulder, not elsewhere classified: Secondary | ICD-10-CM | POA: Diagnosis not present

## 2017-01-26 DIAGNOSIS — M75121 Complete rotator cuff tear or rupture of right shoulder, not specified as traumatic: Secondary | ICD-10-CM | POA: Diagnosis not present

## 2017-01-26 DIAGNOSIS — M25511 Pain in right shoulder: Secondary | ICD-10-CM | POA: Diagnosis not present

## 2017-01-26 NOTE — Assessment & Plan Note (Signed)
Radiographs negative and ultrasound reassuring.  No evidence fracture or lis franc injury.  2/2 severe foot sprain.  Reassured.  Icing, supportive shoes, ibuprofen or aleve.  Arch binder if this does not increase pain level.  If still struggling at follow up in 4 weeks will consider MRI.

## 2017-01-26 NOTE — Progress Notes (Signed)
PCP and consultation requested by: Ann Held, DO  Subjective:   HPI: Patient is a 71 y.o. female here for right foot pain.  2/9: Patient reports about 2 1/2 weeks ago she was coming down the steps when wearing slippers. Accidentally missed the last 2-3 steps on hardware floor and landed hard planting right foot then falling forward. Could bear weight initially but developed swelling after this. Has been icing, elevating, resting. Pain dorsal foot but goes into heel and arch. Pain worse with walking to 7/10 level, sharp. No skin changes, numbness.  3/19: Patient reports she continues to have problems with right foot. Still with swelling, arch aches and hurts too. Ok wearing supportive shoes but not when barefoot. Worse with walking. Pain level 1/10, achy. Feels a locking up sensation dorsally. No skin changes, numbness. + swelling  Past Medical History:  Diagnosis Date  . Hyperlipidemia   . Hypertension   . Osteopenia     Current Outpatient Prescriptions on File Prior to Visit  Medication Sig Dispense Refill  . aspirin 81 MG EC tablet Take 81 mg by mouth.      Marland Kitchen atorvastatin (LIPITOR) 20 MG tablet TAKE 1 TABLET EVERY DAY 90 tablet 0  . buPROPion (WELLBUTRIN XL) 150 MG 24 hr tablet TAKE 1 TABLET EVERY MORNING 90 tablet 1  . Calcium 1500 MG tablet Take 1,500 mg by mouth.      . cholecalciferol (VITAMIN D) 1000 UNITS tablet Take 1,000 Units by mouth.      . Diclofenac Sodium (PENNSAID TD) Place 1 application onto the skin daily.    . furosemide (LASIX) 20 MG tablet TAKE 1 TABLET EVERY DAY 90 tablet 1  . Multiple Vitamins-Minerals (MULTIVITAMIN PO) Take 1 tablet by mouth daily.    . Omega-3 Fatty Acids (FISH OIL) 1000 MG CAPS Take 1 capsule by mouth daily.      . valsartan (DIOVAN) 320 MG tablet Take 1 tablet (320 mg total) by mouth daily. 90 tablet 1  . vitamin E 400 UNIT capsule Take 400 Units by mouth.       No current facility-administered medications on file  prior to visit.     Past Surgical History:  Procedure Laterality Date  . BLEPHAROPLASTY Bilateral 08/2016   Dr Shelbie Proctor  . KNEE SURGERY Left 09/12/13  . TUBAL LIGATION      Allergies  Allergen Reactions  . Adrenalin [Epinephrine] Other (See Comments)    dizziness  . Lisinopril Cough    Social History   Social History  . Marital status: Married    Spouse name: N/A  . Number of children: N/A  . Years of education: N/A   Occupational History  . retired Retired   Social History Main Topics  . Smoking status: Never Smoker  . Smokeless tobacco: Never Used  . Alcohol use Yes     Comment: occasional  . Drug use: No  . Sexual activity: Yes    Partners: Male   Other Topics Concern  . Not on file   Social History Narrative   Exercise--walk 5 miles 5 days a week    Family History  Problem Relation Age of Onset  . Coronary artery disease      female 1st degree relative <50/female 1st degree relative <60  . Cervical cancer    . Lung cancer    . Dementia Mother   . Hypertension Mother   . Heart disease Mother 28    CABG  . Hypertension Father   .  Heart disease Father 9    MI  . Diabetes Brother 16    type 1  . Hypertension Brother   . Heart disease Brother     cabg  . Cancer Maternal Aunt 68    cervical  . Diabetes Other     committed suicide  . Colon cancer Neg Hx     BP (!) 162/95   Pulse 71   Ht 5\' 2"  (1.575 m)   Wt 116 lb (52.6 kg)   BMI 21.22 kg/m   Review of Systems: See HPI above.     Objective:  Physical Exam:  Gen: NAD, comfortable in exam room  Right foot/ankle: No gross deformity, swelling, ecchymoses FROM with 5/5 strength all directions. TTP base 2nd - 4th metatarsals.  No other tenderness of foot or ankle. Negative ant drawer and talar tilt.   Negative syndesmotic compression. Negative metatarsal squeeze. Thompsons test negative. NV intact distally.  Left foot/ankle: FROM without pain.  MSK u/s:  No evidence cortical  irregularity.  No swelling of TMT joint or edema overlying cortices of metatarsals.  No other deformity.   Assessment & Plan:  1. Right foot injury - Radiographs negative and ultrasound reassuring.  No evidence fracture or lis franc injury.  2/2 severe foot sprain.  Reassured.  Icing, supportive shoes, ibuprofen or aleve.  Arch binder if this does not increase pain level.  If still struggling at follow up in 4 weeks will consider MRI.

## 2017-01-27 ENCOUNTER — Encounter: Payer: Self-pay | Admitting: Family Medicine

## 2017-01-28 DIAGNOSIS — M25511 Pain in right shoulder: Secondary | ICD-10-CM | POA: Diagnosis not present

## 2017-01-28 DIAGNOSIS — M25611 Stiffness of right shoulder, not elsewhere classified: Secondary | ICD-10-CM | POA: Diagnosis not present

## 2017-01-28 DIAGNOSIS — M75121 Complete rotator cuff tear or rupture of right shoulder, not specified as traumatic: Secondary | ICD-10-CM | POA: Diagnosis not present

## 2017-01-28 NOTE — Telephone Encounter (Signed)
I know I did not tell her it was covered under her co pay--- I assume it is because downstairs is considered hospital and not office but am sending this to you for verification

## 2017-01-31 DIAGNOSIS — M25511 Pain in right shoulder: Secondary | ICD-10-CM | POA: Diagnosis not present

## 2017-01-31 DIAGNOSIS — M75121 Complete rotator cuff tear or rupture of right shoulder, not specified as traumatic: Secondary | ICD-10-CM | POA: Diagnosis not present

## 2017-01-31 DIAGNOSIS — M25611 Stiffness of right shoulder, not elsewhere classified: Secondary | ICD-10-CM | POA: Diagnosis not present

## 2017-01-31 NOTE — Telephone Encounter (Signed)
I sent this to [facility] and [place] I would not telll a pt a xray is included in ov if they get it downstairs--- not sure who did

## 2017-01-31 NOTE — Telephone Encounter (Signed)
Left patient a message to call back for additional information

## 2017-02-01 DIAGNOSIS — M25511 Pain in right shoulder: Secondary | ICD-10-CM | POA: Diagnosis not present

## 2017-02-01 DIAGNOSIS — M25571 Pain in right ankle and joints of right foot: Secondary | ICD-10-CM | POA: Diagnosis not present

## 2017-02-02 DIAGNOSIS — M75121 Complete rotator cuff tear or rupture of right shoulder, not specified as traumatic: Secondary | ICD-10-CM | POA: Diagnosis not present

## 2017-02-02 DIAGNOSIS — M25611 Stiffness of right shoulder, not elsewhere classified: Secondary | ICD-10-CM | POA: Diagnosis not present

## 2017-02-02 DIAGNOSIS — M25511 Pain in right shoulder: Secondary | ICD-10-CM | POA: Diagnosis not present

## 2017-02-07 NOTE — Telephone Encounter (Signed)
Talked to the patient explained the visit are separate and charges still stand. Patient understood

## 2017-02-08 DIAGNOSIS — M25611 Stiffness of right shoulder, not elsewhere classified: Secondary | ICD-10-CM | POA: Diagnosis not present

## 2017-02-08 DIAGNOSIS — M75121 Complete rotator cuff tear or rupture of right shoulder, not specified as traumatic: Secondary | ICD-10-CM | POA: Diagnosis not present

## 2017-02-08 DIAGNOSIS — M25511 Pain in right shoulder: Secondary | ICD-10-CM | POA: Diagnosis not present

## 2017-02-10 DIAGNOSIS — M25611 Stiffness of right shoulder, not elsewhere classified: Secondary | ICD-10-CM | POA: Diagnosis not present

## 2017-02-10 DIAGNOSIS — M75121 Complete rotator cuff tear or rupture of right shoulder, not specified as traumatic: Secondary | ICD-10-CM | POA: Diagnosis not present

## 2017-02-10 DIAGNOSIS — M25511 Pain in right shoulder: Secondary | ICD-10-CM | POA: Diagnosis not present

## 2017-02-11 DIAGNOSIS — M25571 Pain in right ankle and joints of right foot: Secondary | ICD-10-CM | POA: Diagnosis not present

## 2017-02-14 DIAGNOSIS — S92354A Nondisplaced fracture of fifth metatarsal bone, right foot, initial encounter for closed fracture: Secondary | ICD-10-CM | POA: Diagnosis not present

## 2017-02-16 DIAGNOSIS — M75121 Complete rotator cuff tear or rupture of right shoulder, not specified as traumatic: Secondary | ICD-10-CM | POA: Diagnosis not present

## 2017-02-16 DIAGNOSIS — M25611 Stiffness of right shoulder, not elsewhere classified: Secondary | ICD-10-CM | POA: Diagnosis not present

## 2017-02-16 DIAGNOSIS — M25511 Pain in right shoulder: Secondary | ICD-10-CM | POA: Diagnosis not present

## 2017-02-18 DIAGNOSIS — M75121 Complete rotator cuff tear or rupture of right shoulder, not specified as traumatic: Secondary | ICD-10-CM | POA: Diagnosis not present

## 2017-02-18 DIAGNOSIS — M25511 Pain in right shoulder: Secondary | ICD-10-CM | POA: Diagnosis not present

## 2017-02-18 DIAGNOSIS — M25611 Stiffness of right shoulder, not elsewhere classified: Secondary | ICD-10-CM | POA: Diagnosis not present

## 2017-02-21 DIAGNOSIS — M75121 Complete rotator cuff tear or rupture of right shoulder, not specified as traumatic: Secondary | ICD-10-CM | POA: Diagnosis not present

## 2017-02-21 DIAGNOSIS — M25511 Pain in right shoulder: Secondary | ICD-10-CM | POA: Diagnosis not present

## 2017-02-21 DIAGNOSIS — M25611 Stiffness of right shoulder, not elsewhere classified: Secondary | ICD-10-CM | POA: Diagnosis not present

## 2017-02-23 DIAGNOSIS — M75121 Complete rotator cuff tear or rupture of right shoulder, not specified as traumatic: Secondary | ICD-10-CM | POA: Diagnosis not present

## 2017-02-23 DIAGNOSIS — M25511 Pain in right shoulder: Secondary | ICD-10-CM | POA: Diagnosis not present

## 2017-02-23 DIAGNOSIS — M25611 Stiffness of right shoulder, not elsewhere classified: Secondary | ICD-10-CM | POA: Diagnosis not present

## 2017-02-28 DIAGNOSIS — M25611 Stiffness of right shoulder, not elsewhere classified: Secondary | ICD-10-CM | POA: Diagnosis not present

## 2017-02-28 DIAGNOSIS — M25511 Pain in right shoulder: Secondary | ICD-10-CM | POA: Diagnosis not present

## 2017-02-28 DIAGNOSIS — M75121 Complete rotator cuff tear or rupture of right shoulder, not specified as traumatic: Secondary | ICD-10-CM | POA: Diagnosis not present

## 2017-03-02 DIAGNOSIS — M25611 Stiffness of right shoulder, not elsewhere classified: Secondary | ICD-10-CM | POA: Diagnosis not present

## 2017-03-02 DIAGNOSIS — M25511 Pain in right shoulder: Secondary | ICD-10-CM | POA: Diagnosis not present

## 2017-03-02 DIAGNOSIS — M75121 Complete rotator cuff tear or rupture of right shoulder, not specified as traumatic: Secondary | ICD-10-CM | POA: Diagnosis not present

## 2017-03-07 DIAGNOSIS — M25511 Pain in right shoulder: Secondary | ICD-10-CM | POA: Diagnosis not present

## 2017-03-07 DIAGNOSIS — M75121 Complete rotator cuff tear or rupture of right shoulder, not specified as traumatic: Secondary | ICD-10-CM | POA: Diagnosis not present

## 2017-03-07 DIAGNOSIS — M25611 Stiffness of right shoulder, not elsewhere classified: Secondary | ICD-10-CM | POA: Diagnosis not present

## 2017-03-09 ENCOUNTER — Other Ambulatory Visit: Payer: Self-pay | Admitting: Family Medicine

## 2017-03-09 DIAGNOSIS — M75121 Complete rotator cuff tear or rupture of right shoulder, not specified as traumatic: Secondary | ICD-10-CM | POA: Diagnosis not present

## 2017-03-09 DIAGNOSIS — Z23 Encounter for immunization: Secondary | ICD-10-CM | POA: Diagnosis not present

## 2017-03-09 DIAGNOSIS — M25611 Stiffness of right shoulder, not elsewhere classified: Secondary | ICD-10-CM | POA: Diagnosis not present

## 2017-03-09 DIAGNOSIS — M25511 Pain in right shoulder: Secondary | ICD-10-CM | POA: Diagnosis not present

## 2017-03-14 DIAGNOSIS — M25611 Stiffness of right shoulder, not elsewhere classified: Secondary | ICD-10-CM | POA: Diagnosis not present

## 2017-03-14 DIAGNOSIS — M75121 Complete rotator cuff tear or rupture of right shoulder, not specified as traumatic: Secondary | ICD-10-CM | POA: Diagnosis not present

## 2017-03-14 DIAGNOSIS — M25511 Pain in right shoulder: Secondary | ICD-10-CM | POA: Diagnosis not present

## 2017-03-16 DIAGNOSIS — M25511 Pain in right shoulder: Secondary | ICD-10-CM | POA: Diagnosis not present

## 2017-03-16 DIAGNOSIS — M25611 Stiffness of right shoulder, not elsewhere classified: Secondary | ICD-10-CM | POA: Diagnosis not present

## 2017-03-16 DIAGNOSIS — M75121 Complete rotator cuff tear or rupture of right shoulder, not specified as traumatic: Secondary | ICD-10-CM | POA: Diagnosis not present

## 2017-03-21 DIAGNOSIS — M25611 Stiffness of right shoulder, not elsewhere classified: Secondary | ICD-10-CM | POA: Diagnosis not present

## 2017-03-21 DIAGNOSIS — M75121 Complete rotator cuff tear or rupture of right shoulder, not specified as traumatic: Secondary | ICD-10-CM | POA: Diagnosis not present

## 2017-03-21 DIAGNOSIS — M25511 Pain in right shoulder: Secondary | ICD-10-CM | POA: Diagnosis not present

## 2017-03-24 DIAGNOSIS — L821 Other seborrheic keratosis: Secondary | ICD-10-CM | POA: Diagnosis not present

## 2017-03-24 DIAGNOSIS — D485 Neoplasm of uncertain behavior of skin: Secondary | ICD-10-CM | POA: Diagnosis not present

## 2017-03-24 DIAGNOSIS — D1801 Hemangioma of skin and subcutaneous tissue: Secondary | ICD-10-CM | POA: Diagnosis not present

## 2017-03-24 DIAGNOSIS — L814 Other melanin hyperpigmentation: Secondary | ICD-10-CM | POA: Diagnosis not present

## 2017-03-25 DIAGNOSIS — M75121 Complete rotator cuff tear or rupture of right shoulder, not specified as traumatic: Secondary | ICD-10-CM | POA: Diagnosis not present

## 2017-03-25 DIAGNOSIS — M25511 Pain in right shoulder: Secondary | ICD-10-CM | POA: Diagnosis not present

## 2017-03-25 DIAGNOSIS — M25611 Stiffness of right shoulder, not elsewhere classified: Secondary | ICD-10-CM | POA: Diagnosis not present

## 2017-03-28 DIAGNOSIS — M75121 Complete rotator cuff tear or rupture of right shoulder, not specified as traumatic: Secondary | ICD-10-CM | POA: Diagnosis not present

## 2017-03-28 DIAGNOSIS — M25611 Stiffness of right shoulder, not elsewhere classified: Secondary | ICD-10-CM | POA: Diagnosis not present

## 2017-03-28 DIAGNOSIS — S92354D Nondisplaced fracture of fifth metatarsal bone, right foot, subsequent encounter for fracture with routine healing: Secondary | ICD-10-CM | POA: Diagnosis not present

## 2017-03-28 DIAGNOSIS — M25511 Pain in right shoulder: Secondary | ICD-10-CM | POA: Diagnosis not present

## 2017-03-30 DIAGNOSIS — M75121 Complete rotator cuff tear or rupture of right shoulder, not specified as traumatic: Secondary | ICD-10-CM | POA: Diagnosis not present

## 2017-03-30 DIAGNOSIS — M25611 Stiffness of right shoulder, not elsewhere classified: Secondary | ICD-10-CM | POA: Diagnosis not present

## 2017-03-30 DIAGNOSIS — M25511 Pain in right shoulder: Secondary | ICD-10-CM | POA: Diagnosis not present

## 2017-04-06 ENCOUNTER — Other Ambulatory Visit: Payer: Self-pay | Admitting: Family Medicine

## 2017-04-06 DIAGNOSIS — M75121 Complete rotator cuff tear or rupture of right shoulder, not specified as traumatic: Secondary | ICD-10-CM | POA: Diagnosis not present

## 2017-04-06 DIAGNOSIS — M25511 Pain in right shoulder: Secondary | ICD-10-CM | POA: Diagnosis not present

## 2017-04-06 DIAGNOSIS — M25611 Stiffness of right shoulder, not elsewhere classified: Secondary | ICD-10-CM | POA: Diagnosis not present

## 2017-04-08 DIAGNOSIS — M25611 Stiffness of right shoulder, not elsewhere classified: Secondary | ICD-10-CM | POA: Diagnosis not present

## 2017-04-08 DIAGNOSIS — M75121 Complete rotator cuff tear or rupture of right shoulder, not specified as traumatic: Secondary | ICD-10-CM | POA: Diagnosis not present

## 2017-04-08 DIAGNOSIS — M25511 Pain in right shoulder: Secondary | ICD-10-CM | POA: Diagnosis not present

## 2017-04-11 DIAGNOSIS — M75121 Complete rotator cuff tear or rupture of right shoulder, not specified as traumatic: Secondary | ICD-10-CM | POA: Diagnosis not present

## 2017-04-11 DIAGNOSIS — M25611 Stiffness of right shoulder, not elsewhere classified: Secondary | ICD-10-CM | POA: Diagnosis not present

## 2017-04-11 DIAGNOSIS — M25511 Pain in right shoulder: Secondary | ICD-10-CM | POA: Diagnosis not present

## 2017-04-13 DIAGNOSIS — M75121 Complete rotator cuff tear or rupture of right shoulder, not specified as traumatic: Secondary | ICD-10-CM | POA: Diagnosis not present

## 2017-04-13 DIAGNOSIS — M25511 Pain in right shoulder: Secondary | ICD-10-CM | POA: Diagnosis not present

## 2017-04-13 DIAGNOSIS — M25611 Stiffness of right shoulder, not elsewhere classified: Secondary | ICD-10-CM | POA: Diagnosis not present

## 2017-04-18 DIAGNOSIS — M25511 Pain in right shoulder: Secondary | ICD-10-CM | POA: Diagnosis not present

## 2017-04-18 DIAGNOSIS — M25611 Stiffness of right shoulder, not elsewhere classified: Secondary | ICD-10-CM | POA: Diagnosis not present

## 2017-04-18 DIAGNOSIS — M75121 Complete rotator cuff tear or rupture of right shoulder, not specified as traumatic: Secondary | ICD-10-CM | POA: Diagnosis not present

## 2017-04-20 DIAGNOSIS — M75121 Complete rotator cuff tear or rupture of right shoulder, not specified as traumatic: Secondary | ICD-10-CM | POA: Diagnosis not present

## 2017-04-20 DIAGNOSIS — M25611 Stiffness of right shoulder, not elsewhere classified: Secondary | ICD-10-CM | POA: Diagnosis not present

## 2017-04-20 DIAGNOSIS — M25511 Pain in right shoulder: Secondary | ICD-10-CM | POA: Diagnosis not present

## 2017-04-25 DIAGNOSIS — M25511 Pain in right shoulder: Secondary | ICD-10-CM | POA: Diagnosis not present

## 2017-04-25 DIAGNOSIS — M25611 Stiffness of right shoulder, not elsewhere classified: Secondary | ICD-10-CM | POA: Diagnosis not present

## 2017-04-25 DIAGNOSIS — M75121 Complete rotator cuff tear or rupture of right shoulder, not specified as traumatic: Secondary | ICD-10-CM | POA: Diagnosis not present

## 2017-04-27 DIAGNOSIS — M75121 Complete rotator cuff tear or rupture of right shoulder, not specified as traumatic: Secondary | ICD-10-CM | POA: Diagnosis not present

## 2017-04-27 DIAGNOSIS — M25611 Stiffness of right shoulder, not elsewhere classified: Secondary | ICD-10-CM | POA: Diagnosis not present

## 2017-04-27 DIAGNOSIS — M25511 Pain in right shoulder: Secondary | ICD-10-CM | POA: Diagnosis not present

## 2017-05-02 DIAGNOSIS — M25611 Stiffness of right shoulder, not elsewhere classified: Secondary | ICD-10-CM | POA: Diagnosis not present

## 2017-05-02 DIAGNOSIS — M25511 Pain in right shoulder: Secondary | ICD-10-CM | POA: Diagnosis not present

## 2017-05-02 DIAGNOSIS — M75121 Complete rotator cuff tear or rupture of right shoulder, not specified as traumatic: Secondary | ICD-10-CM | POA: Diagnosis not present

## 2017-05-04 DIAGNOSIS — M75121 Complete rotator cuff tear or rupture of right shoulder, not specified as traumatic: Secondary | ICD-10-CM | POA: Diagnosis not present

## 2017-05-04 DIAGNOSIS — M25511 Pain in right shoulder: Secondary | ICD-10-CM | POA: Diagnosis not present

## 2017-05-04 DIAGNOSIS — M25611 Stiffness of right shoulder, not elsewhere classified: Secondary | ICD-10-CM | POA: Diagnosis not present

## 2017-05-09 DIAGNOSIS — M75121 Complete rotator cuff tear or rupture of right shoulder, not specified as traumatic: Secondary | ICD-10-CM | POA: Diagnosis not present

## 2017-05-09 DIAGNOSIS — M25511 Pain in right shoulder: Secondary | ICD-10-CM | POA: Diagnosis not present

## 2017-05-09 DIAGNOSIS — M25611 Stiffness of right shoulder, not elsewhere classified: Secondary | ICD-10-CM | POA: Diagnosis not present

## 2017-05-13 DIAGNOSIS — M25611 Stiffness of right shoulder, not elsewhere classified: Secondary | ICD-10-CM | POA: Diagnosis not present

## 2017-05-13 DIAGNOSIS — M25511 Pain in right shoulder: Secondary | ICD-10-CM | POA: Diagnosis not present

## 2017-05-13 DIAGNOSIS — M75121 Complete rotator cuff tear or rupture of right shoulder, not specified as traumatic: Secondary | ICD-10-CM | POA: Diagnosis not present

## 2017-05-16 DIAGNOSIS — M25611 Stiffness of right shoulder, not elsewhere classified: Secondary | ICD-10-CM | POA: Diagnosis not present

## 2017-05-16 DIAGNOSIS — M75121 Complete rotator cuff tear or rupture of right shoulder, not specified as traumatic: Secondary | ICD-10-CM | POA: Diagnosis not present

## 2017-05-16 DIAGNOSIS — M25511 Pain in right shoulder: Secondary | ICD-10-CM | POA: Diagnosis not present

## 2017-05-17 ENCOUNTER — Other Ambulatory Visit: Payer: Self-pay | Admitting: Family Medicine

## 2017-05-18 DIAGNOSIS — M75121 Complete rotator cuff tear or rupture of right shoulder, not specified as traumatic: Secondary | ICD-10-CM | POA: Diagnosis not present

## 2017-05-18 DIAGNOSIS — M25611 Stiffness of right shoulder, not elsewhere classified: Secondary | ICD-10-CM | POA: Diagnosis not present

## 2017-05-18 DIAGNOSIS — M25511 Pain in right shoulder: Secondary | ICD-10-CM | POA: Diagnosis not present

## 2017-05-23 ENCOUNTER — Encounter: Payer: Self-pay | Admitting: Family Medicine

## 2017-05-23 NOTE — Progress Notes (Signed)
Subjective:   Natasha Trujillo is a 71 y.o. female who presents for Medicare Annual (Subsequent) preventive examination.  Review of Systems:  No ROS.  Medicare Wellness Visit. Additional risk factors are reflected in the social history.  Cardiac Risk Factors include: advanced age (>74men, >73 women);dyslipidemia;hypertension Sleep patterns:  Sleeps about 9-10 hrs per night. Feels rested.   Home Safety/Smoke Alarms: Feels safe in home. Smoke alarms in place.  Living environment; residence and Adult nurse: lives with husband in 2 story home. No issues with stairs. No guns.   Seat Belt Safety/Bike Helmet: Wears seat belt.   Counseling:   Eye Exam- Reading glasses only. Only goes to eye doctor as needed.  Dental- Dr.Reeves every 6 months  Female:   Pap-  Last 08/08/16 per pt. Normal with Dr.Graywall Mammo-  Last 08/27/16: BI-RADS Category 1-negative per pt    Dexa scan-  Last 08/19/15: osteopenia      CCS- Last 08/21/13: Normal. Recall 5 yrs due to hx.     Objective:     Vitals: BP (!) 167/86 (BP Location: Right Arm, Cuff Size: Normal)   Pulse 70   Ht 5' 1.5" (1.562 m)   Wt 120 lb (54.4 kg)   SpO2 98%   BMI 22.31 kg/m   Body mass index is 22.31 kg/m.   Tobacco History  Smoking Status  . Never Smoker  Smokeless Tobacco  . Never Used     Counseling given: No   Past Medical History:  Diagnosis Date  . Hyperlipidemia   . Hypertension   . Osteopenia    Past Surgical History:  Procedure Laterality Date  . BLEPHAROPLASTY Bilateral 08/2016   Dr Shelbie Proctor  . KNEE SURGERY Left 09/12/13  . SHOULDER SURGERY Right 01/05/2017   Dr.Caffrey  . TUBAL LIGATION     Family History  Problem Relation Age of Onset  . Dementia Mother   . Hypertension Mother   . Heart disease Mother 7       CABG  . Hypertension Father   . Heart disease Father 31       MI  . Diabetes Brother 16       type 1  . Hypertension Brother   . Heart disease Brother        cabg  .  Cancer Maternal Aunt 68       cervical  . Diabetes Other        committed suicide  . Coronary artery disease Unknown        female 1st degree relative <50/female 1st degree relative <60  . Cervical cancer Unknown   . Lung cancer Unknown   . Colon cancer Neg Hx    History  Sexual Activity  . Sexual activity: Yes  . Partners: Male    Outpatient Encounter Prescriptions as of 05/27/2017  Medication Sig  . aspirin 81 MG EC tablet Take 81 mg by mouth.    Marland Kitchen atorvastatin (LIPITOR) 20 MG tablet TAKE 1 TABLET EVERY DAY  . buPROPion (WELLBUTRIN XL) 150 MG 24 hr tablet TAKE 1 TABLET EVERY MORNING  . Calcium 1500 MG tablet Take 1,500 mg by mouth.    . cholecalciferol (VITAMIN D) 1000 UNITS tablet Take 1,000 Units by mouth.    . Diclofenac Sodium (PENNSAID TD) Place 1 application onto the skin daily.  Marland Kitchen losartan (COZAAR) 100 MG tablet Take 1 tablet (100 mg total) by mouth daily.  . Multiple Vitamins-Minerals (MULTIVITAMIN PO) Take 1 tablet by mouth daily.  Marland Kitchen  Omega-3 Fatty Acids (FISH OIL) 1000 MG CAPS Take 1 capsule by mouth daily.    . vitamin E 400 UNIT capsule Take 400 Units by mouth.    . [DISCONTINUED] furosemide (LASIX) 20 MG tablet TAKE 1 TABLET EVERY DAY  . [DISCONTINUED] valsartan (DIOVAN) 320 MG tablet TAKE 1 TABLET EVERY DAY   No facility-administered encounter medications on file as of 05/27/2017.     Activities of Daily Living In your present state of health, do you have any difficulty performing the following activities: 05/27/2017 12/03/2016  Hearing? N N  Vision? N N  Difficulty concentrating or making decisions? N N  Walking or climbing stairs? N N  Dressing or bathing? N N  Doing errands, shopping? N N  Preparing Food and eating ? N -  Using the Toilet? N -  In the past six months, have you accidently leaked urine? N -  Do you have problems with loss of bowel control? N -  Managing your Medications? N -  Housekeeping or managing your Housekeeping? N -  Some recent data  might be hidden    Patient Care Team: Carollee Herter, Alferd Apa, DO as PCP - General Harriett Sine, MD as Consulting Physician (Dermatology) Dian Queen, MD as Consulting Physician (Obstetrics and Gynecology)    Assessment:    Physical assessment deferred to PCP.  Exercise Activities and Dietary recommendations Current Exercise Habits: Home exercise routine, Type of exercise: walking, Time (Minutes): 60, Frequency (Times/Week): 2, Weekly Exercise (Minutes/Week): 120, Intensity: Mild   Diet (meal preparation, eat out, water intake, caffeinated beverages, dairy products, fruits and vegetables): in general, a "healthy" diet       Goals      Patient Stated   . Drink 3 bottles of water per day (pt-stated)      Other   . Walk 60 min 5x/week      Fall Risk Fall Risk  05/27/2017 12/03/2016 04/27/2016 10/28/2015 09/19/2015  Falls in the past year? Yes No No No No  Number falls in past yr: 1 - - - -  Injury with Fall? Yes - - - -  Follow up Education provided;Falls prevention discussed - - - -   Depression Screen PHQ 2/9 Scores 05/27/2017 12/03/2016 08/09/2016 04/27/2016  PHQ - 2 Score 0 0 0 0  Exception Documentation - - - Patient refusal     Cognitive Function Ad8 score reviewed for issues:  Issues making decisions:no  Less interest in hobbies / activities:no  Repeats questions, stories (family complaining):no  Trouble using ordinary gadgets (microwave, computer, phone):no  Forgets the month or year: no  Mismanaging finances: no  Remembering appts:no  Daily problems with thinking and/or memory:no Ad8 score is=0        Immunization History  Administered Date(s) Administered  . Influenza Whole 07/26/2008, 08/14/2008, 09/22/2009, 07/24/2010  . Influenza, High Dose Seasonal PF 09/09/2015  . Influenza,inj,Quad PF,36+ Mos 08/15/2013, 08/30/2014  . Pneumococcal Conjugate-13 06/20/2014  . Pneumococcal Polysaccharide-23 05/10/2013  . Td 09/22/2005, 07/26/2008  .  Zoster 06/04/2008  . Zoster Recombinat (Shingrix) 03/09/2017   Screening Tests Health Maintenance  Topic Date Due  . INFLUENZA VACCINE  08/09/2017 (Originally 06/08/2017)  . MAMMOGRAM  08/27/2017  . TETANUS/TDAP  07/26/2018  . COLONOSCOPY  08/21/2018  . DEXA SCAN  Completed  . Hepatitis C Screening  Completed  . PNA vac Low Risk Adult  Completed      Plan:   Follow up with PCP today as scheduled.  Continue to eat heart  healthy diet (full of fruits, vegetables, whole grains, lean protein, water--limit salt, fat, and sugar intake) and increase physical activity as tolerated.  Continue doing brain stimulating activities (puzzles, reading, adult coloring books, staying active) to keep memory sharp.    I have personally reviewed and noted the following in the patient's chart:   . Medical and social history . Use of alcohol, tobacco or illicit drugs  . Current medications and supplements . Functional ability and status . Nutritional status . Physical activity . Advanced directives . List of other physicians . Hospitalizations, surgeries, and ER visits in previous 12 months . Vitals . Screenings to include cognitive, depression, and falls . Referrals and appointments  In addition, I have reviewed and discussed with patient certain preventive protocols, quality metrics, and best practice recommendations. A written personalized care plan for preventive services as well as general preventive health recommendations were provided to patient.     Naaman Plummer [place], [place]  05/27/2017

## 2017-05-23 NOTE — Telephone Encounter (Signed)
That came out today D/c valsartan and change to losartan 100mg  #90  1 po qd  ---- ov 2-3 weeks for bp check only to make sure it is working

## 2017-05-24 ENCOUNTER — Other Ambulatory Visit: Payer: Self-pay | Admitting: Family Medicine

## 2017-05-24 MED ORDER — LOSARTAN POTASSIUM 100 MG PO TABS: 100.0000 mg | ORAL_TABLET | Freq: Every day | ORAL | 0 refills | Status: DC

## 2017-05-25 DIAGNOSIS — M2021 Hallux rigidus, right foot: Secondary | ICD-10-CM | POA: Diagnosis not present

## 2017-05-27 ENCOUNTER — Ambulatory Visit: Payer: Medicare HMO | Admitting: *Deleted

## 2017-05-27 ENCOUNTER — Ambulatory Visit (INDEPENDENT_AMBULATORY_CARE_PROVIDER_SITE_OTHER): Payer: Medicare HMO | Admitting: Family Medicine

## 2017-05-27 ENCOUNTER — Encounter: Payer: Self-pay | Admitting: Family Medicine

## 2017-05-27 VITALS — BP 167/86 | HR 70 | Ht 61.5 in | Wt 120.0 lb

## 2017-05-27 DIAGNOSIS — I10 Essential (primary) hypertension: Secondary | ICD-10-CM

## 2017-05-27 DIAGNOSIS — E785 Hyperlipidemia, unspecified: Secondary | ICD-10-CM | POA: Diagnosis not present

## 2017-05-27 DIAGNOSIS — Z0000 Encounter for general adult medical examination without abnormal findings: Secondary | ICD-10-CM | POA: Diagnosis not present

## 2017-05-27 LAB — COMPREHENSIVE METABOLIC PANEL
ALK PHOS: 63 U/L (ref 39–117)
ALT: 18 U/L (ref 0–35)
AST: 19 U/L (ref 0–37)
Albumin: 4.2 g/dL (ref 3.5–5.2)
BILIRUBIN TOTAL: 0.4 mg/dL (ref 0.2–1.2)
BUN: 11 mg/dL (ref 6–23)
CO2: 29 mEq/L (ref 19–32)
CREATININE: 0.58 mg/dL (ref 0.40–1.20)
Calcium: 9.4 mg/dL (ref 8.4–10.5)
Chloride: 103 mEq/L (ref 96–112)
GFR: 109.27 mL/min (ref >60.00)
GLUCOSE: 121 mg/dL — AB (ref 70–99)
Potassium: 3.9 mEq/L (ref 3.5–5.1)
SODIUM: 138 meq/L (ref 135–145)
TOTAL PROTEIN: 6.5 g/dL (ref 6.0–8.3)

## 2017-05-27 LAB — LIPID PANEL
CHOLESTEROL: 145 mg/dL (ref 0–200)
HDL: 74.6 mg/dL (ref >39.00)
LDL Cholesterol: 62 mg/dL (ref 0–99)
NONHDL: 70.36
Total CHOL/HDL Ratio: 2
Triglycerides: 43 mg/dL (ref 0.0–149.0)
VLDL: 8.6 mg/dL (ref 0.0–40.0)

## 2017-05-27 LAB — TSH: TSH: 1.68 u[IU]/mL (ref 0.35–4.50)

## 2017-05-27 MED ORDER — OLMESARTAN MEDOXOMIL 40 MG PO TABS: 40.0000 mg | ORAL_TABLET | Freq: Every day | ORAL | 2 refills | Status: DC

## 2017-05-27 NOTE — Patient Instructions (Signed)
[name] , Thank you for taking time to come for your Medicare Wellness Visit. I appreciate your ongoing commitment to your health goals. Please review the following plan we discussed and let me know if I can assist you in the future.   These are the goals we discussed: Goals      Patient Stated   . Drink 3 bottles of water per day (pt-stated)      Other   . Walk 60 min 5x/week       This is a list of the screening recommended for you and due dates:  Health Maintenance  Topic Date Due  . Flu Shot  08/09/2017*  . Mammogram  08/27/2017  . Tetanus Vaccine  07/26/2018  . Colon Cancer Screening  08/21/2018  . DEXA scan (bone density measurement)  Completed  .  Hepatitis C: One time screening is recommended by Center for Disease Control  (CDC) for  adults born from [birth year] through 1964.   Completed  . Pneumonia vaccines  Completed  *Topic was postponed. The date shown is not the original due date.   Continue to eat heart healthy diet (full of fruits, vegetables, whole grains, lean protein, water--limit salt, fat, and sugar intake) and increase physical activity as tolerated.  Continue doing brain stimulating activities (puzzles, reading, adult coloring books, staying active) to keep memory sharp.     Health Maintenance for Postmenopausal Women Menopause is a normal process in which your reproductive ability comes to an end. This process happens gradually over a span of months to years, usually between the ages of 13 and 31. Menopause is complete when you have missed 12 consecutive menstrual periods. It is important to talk with your health care provider about some of the most common conditions that affect postmenopausal women, such as heart disease, cancer, and bone loss (osteoporosis). Adopting a healthy lifestyle and getting preventive care can help to promote your health and wellness. Those actions can also lower your chances of developing some of these common conditions. What  should I know about menopause? During menopause, you may experience a number of symptoms, such as:  Moderate-to-severe hot flashes.  Night sweats.  Decrease in sex drive.  Mood swings.  Headaches.  Tiredness.  Irritability.  Memory problems.  Insomnia.  Choosing to treat or not to treat menopausal changes is an individual decision that you make with your health care provider. What should I know about hormone replacement therapy and supplements? Hormone therapy products are effective for treating symptoms that are associated with menopause, such as hot flashes and night sweats. Hormone replacement carries certain risks, especially as you become older. If you are thinking about using estrogen or estrogen with progestin treatments, discuss the benefits and risks with your health care provider. What should I know about heart disease and stroke? Heart disease, heart attack, and stroke become more likely as you age. This may be due, in part, to the hormonal changes that your body experiences during menopause. These can affect how your body processes dietary fats, triglycerides, and cholesterol. Heart attack and stroke are both medical emergencies. There are many things that you can do to help prevent heart disease and stroke:  Have your blood pressure checked at least every 1-2 years. High blood pressure causes heart disease and increases the risk of stroke.  If you are 57-79 years old, ask your health care provider if you should take aspirin to prevent a heart attack or a stroke.  Do not use any  tobacco products, including cigarettes, chewing tobacco, or electronic cigarettes. If you need help quitting, ask your health care provider.  It is important to eat a healthy diet and maintain a healthy weight. ? Be sure to include plenty of vegetables, fruits, low-fat dairy products, and lean protein. ? Avoid eating foods that are high in solid fats, added sugars, or salt (sodium).  Get  regular exercise. This is one of the most important things that you can do for your health. ? Try to exercise for at least 150 minutes each week. The type of exercise that you do should increase your heart rate and make you sweat. This is known as moderate-intensity exercise. ? Try to do strengthening exercises at least twice each week. Do these in addition to the moderate-intensity exercise.  Know your numbers.Ask your health care provider to check your cholesterol and your blood glucose. Continue to have your blood tested as directed by your health care provider.  What should I know about cancer screening? There are several types of cancer. Take the following steps to reduce your risk and to catch any cancer development as early as possible. Breast Cancer  Practice breast self-awareness. ? This means understanding how your breasts normally appear and feel. ? It also means doing regular breast self-exams. Let your health care provider know about any changes, no matter how small.  If you are 70 or older, have a clinician do a breast exam (clinical breast exam or CBE) every year. Depending on your age, family history, and medical history, it may be recommended that you also have a yearly breast X-ray (mammogram).  If you have a family history of breast cancer, talk with your health care provider about genetic screening.  If you are at high risk for breast cancer, talk with your health care provider about having an MRI and a mammogram every year.  Breast cancer (BRCA) gene test is recommended for women who have family members with BRCA-related cancers. Results of the assessment will determine the need for genetic counseling and BRCA1 and for BRCA2 testing. BRCA-related cancers include these types: ? Breast. This occurs in males or females. ? Ovarian. ? Tubal. This may also be called fallopian tube cancer. ? Cancer of the abdominal or pelvic lining (peritoneal  cancer). ? Prostate. ? Pancreatic.  Cervical, Uterine, and Ovarian Cancer Your health care provider may recommend that you be screened regularly for cancer of the pelvic organs. These include your ovaries, uterus, and vagina. This screening involves a pelvic exam, which includes checking for microscopic changes to the surface of your cervix (Pap test).  For women ages 21-65, health care providers may recommend a pelvic exam and a Pap test every three years. For women ages 60-65, they may recommend the Pap test and pelvic exam, combined with testing for human papilloma virus (HPV), every five years. Some types of HPV increase your risk of cervical cancer. Testing for HPV may also be done on women of any age who have unclear Pap test results.  Other health care providers may not recommend any screening for nonpregnant women who are considered low risk for pelvic cancer and have no symptoms. Ask your health care provider if a screening pelvic exam is right for you.  If you have had past treatment for cervical cancer or a condition that could lead to cancer, you need Pap tests and screening for cancer for at least 20 years after your treatment. If Pap tests have been discontinued for you, your risk  factors (such as having a new sexual partner) need to be reassessed to determine if you should start having screenings again. Some women have medical problems that increase the chance of getting cervical cancer. In these cases, your health care provider may recommend that you have screening and Pap tests more often.  If you have a family history of uterine cancer or ovarian cancer, talk with your health care provider about genetic screening.  If you have vaginal bleeding after reaching menopause, tell your health care provider.  There are currently no reliable tests available to screen for ovarian cancer.  Lung Cancer Lung cancer screening is recommended for adults 78-44 years old who are at high risk for  lung cancer because of a history of smoking. A yearly low-dose CT scan of the lungs is recommended if you:  Currently smoke.  Have a history of at least 30 pack-years of smoking and you currently smoke or have quit within the past 15 years. A pack-year is smoking an average of one pack of cigarettes per day for one year.  Yearly screening should:  Continue until it has been 15 years since you quit.  Stop if you develop a health problem that would prevent you from having lung cancer treatment.  Colorectal Cancer  This type of cancer can be detected and can often be prevented.  Routine colorectal cancer screening usually begins at age 30 and continues through age 4.  If you have risk factors for colon cancer, your health care provider may recommend that you be screened at an earlier age.  If you have a family history of colorectal cancer, talk with your health care provider about genetic screening.  Your health care provider may also recommend using home test kits to check for hidden blood in your stool.  A small camera at the end of a tube can be used to examine your colon directly (sigmoidoscopy or colonoscopy). This is done to check for the earliest forms of colorectal cancer.  Direct examination of the colon should be repeated every 5-10 years until age 43. However, if early forms of precancerous polyps or small growths are found or if you have a family history or genetic risk for colorectal cancer, you may need to be screened more often.  Skin Cancer  Check your skin from head to toe regularly.  Monitor any moles. Be sure to tell your health care provider: ? About any new moles or changes in moles, especially if there is a change in a mole's shape or color. ? If you have a mole that is larger than the size of a pencil eraser.  If any of your family members has a history of skin cancer, especially at a young age, talk with your health care provider about genetic  screening.  Always use sunscreen. Apply sunscreen liberally and repeatedly throughout the day.  Whenever you are outside, protect yourself by wearing long sleeves, pants, a wide-brimmed hat, and sunglasses.  What should I know about osteoporosis? Osteoporosis is a condition in which bone destruction happens more quickly than new bone creation. After menopause, you may be at an increased risk for osteoporosis. To help prevent osteoporosis or the bone fractures that can happen because of osteoporosis, the following is recommended:  If you are 6-38 years old, get at least 1,000 mg of calcium and at least 600 mg of vitamin D per day.  If you are older than age 60 but younger than age 82, get at least 1,200 mg  of calcium and at least 600 mg of vitamin D per day.  If you are older than age 29, get at least 1,200 mg of calcium and at least 800 mg of vitamin D per day.  Smoking and excessive alcohol intake increase the risk of osteoporosis. Eat foods that are rich in calcium and vitamin D, and do weight-bearing exercises several times each week as directed by your health care provider. What should I know about how menopause affects my mental health? Depression may occur at any age, but it is more common as you become older. Common symptoms of depression include:  Low or sad mood.  Changes in sleep patterns.  Changes in appetite or eating patterns.  Feeling an overall lack of motivation or enjoyment of activities that you previously enjoyed.  Frequent crying spells.  Talk with your health care provider if you think that you are experiencing depression. What should I know about immunizations? It is important that you get and maintain your immunizations. These include:  Tetanus, diphtheria, and pertussis (Tdap) booster vaccine.  Influenza every year before the flu season begins.  Pneumonia vaccine.  Shingles vaccine.  Your health care provider may also recommend other  immunizations. This information is not intended to replace advice given to you by your health care provider. Make sure you discuss any questions you have with your health care provider. Document Released: 12/17/2005 Document Revised: 05/14/2016 Document Reviewed: 07/29/2015 Elsevier Interactive Patient Education  2018 [employer].

## 2017-05-27 NOTE — Progress Notes (Signed)
Patient ID: Natasha Trujillo, female    DOB: [birth year]  Age: 71 y.o. MRN: [id number]    Subjective:  Subjective  HPI Natasha Trujillo presents for f/u bp, cholesterol Review of Systems  Constitutional: Negative for appetite change, diaphoresis, fatigue and unexpected weight change.  Eyes: Negative for pain, redness and visual disturbance.  Respiratory: Negative for cough, chest tightness, shortness of breath and wheezing.   Cardiovascular: Negative for chest pain, palpitations and leg swelling.  Endocrine: Negative for cold intolerance, heat intolerance, polydipsia, polyphagia and polyuria.  Genitourinary: Negative for difficulty urinating, dysuria and frequency.  Neurological: Negative for dizziness, light-headedness, numbness and headaches.    History Past Medical History:  Diagnosis Date  . Hyperlipidemia   . Hypertension   . Osteopenia     She has a past surgical history that includes Tubal ligation; Knee surgery (Left, 09/12/13); Blepharoplasty (Bilateral, 08/2016); and Shoulder surgery (Right, 01/05/2017).   Her family history includes Cancer (age of onset: 45) in her maternal aunt; Cervical cancer in her unknown relative; Coronary artery disease in her unknown relative; Dementia in her mother; Diabetes in her other; Diabetes (age of onset: 15) in her brother; Heart disease in her brother; Heart disease (age of onset: 43) in her father and mother; Hypertension in her brother, father, and mother; Lung cancer in her unknown relative.She reports that she has never smoked. She has never used smokeless tobacco. She reports that she drinks alcohol. She reports that she does not use drugs.  Current Outpatient Prescriptions on File Prior to Visit  Medication Sig Dispense Refill  . aspirin 81 MG EC tablet Take 81 mg by mouth.      Marland Kitchen atorvastatin (LIPITOR) 20 MG tablet TAKE 1 TABLET EVERY DAY 90 tablet 0  . buPROPion (WELLBUTRIN XL) 150 MG 24 hr tablet TAKE 1 TABLET EVERY MORNING  90 tablet 1  . Calcium 1500 MG tablet Take 1,500 mg by mouth.      . cholecalciferol (VITAMIN D) 1000 UNITS tablet Take 1,000 Units by mouth.      . Diclofenac Sodium (PENNSAID TD) Place 1 application onto the skin daily.    . Multiple Vitamins-Minerals (MULTIVITAMIN PO) Take 1 tablet by mouth daily.    . Omega-3 Fatty Acids (FISH OIL) 1000 MG CAPS Take 1 capsule by mouth daily.      . vitamin E 400 UNIT capsule Take 400 Units by mouth.       No current facility-administered medications on file prior to visit.      Objective:  Objective  Physical Exam  Constitutional: She is oriented to person, place, and time. She appears well-developed and well-nourished.  HENT:  Head: Normocephalic and atraumatic.  Eyes: Conjunctivae and EOM are normal.  Neck: Normal range of motion. Neck supple. No JVD present. Carotid bruit is not present. No thyromegaly present.  Cardiovascular: Normal rate, regular rhythm and normal heart sounds.   No murmur heard. Pulmonary/Chest: Effort normal and breath sounds normal. No respiratory distress. She has no wheezes. She has no rales. She exhibits no tenderness.  Musculoskeletal: She exhibits no edema.  Neurological: She is alert and oriented to person, place, and time.  Psychiatric: She has a normal mood and affect.  Nursing note and vitals reviewed.  BP (!) 167/86 (BP Location: Right Arm, Cuff Size: Normal)   Pulse 70   Ht 5' 1.5" (1.562 m)   Wt 120 lb (54.4 kg)   SpO2 98%   BMI 22.31 kg/m  Wt Readings from Last  3 Encounters:  05/27/17 120 lb (54.4 kg)  01/24/17 116 lb (52.6 kg)  12/27/16 116 lb (52.6 kg)     Lab Results  Component Value Date   WBC 5.5 12/03/2016   HGB 12.3 12/03/2016   HCT 36.2 12/03/2016   PLT 227.0 12/03/2016   GLUCOSE 121 (H) 05/27/2017   CHOL 145 05/27/2017   TRIG 43.0 05/27/2017   HDL 74.60 05/27/2017   LDLCALC 62 05/27/2017   ALT 18 05/27/2017   AST 19 05/27/2017   NA 138 05/27/2017   K 3.9 05/27/2017   CL 103  05/27/2017   CREATININE 0.58 05/27/2017   BUN 11 05/27/2017   CO2 29 05/27/2017   TSH 1.68 05/27/2017   HGBA1C 5.7 05/28/2016   MICROALBUR 0.50 05/10/2013    Dg Foot Complete Right  Result Date: 12/24/2016 CLINICAL DATA:  [place] 2 weeks ago, pain and swelling at top of RIGHT foot EXAM: RIGHT FOOT COMPLETE - 3+ VIEW COMPARISON:  12/09/2016 FINDINGS: Mild osseous demineralization. Degenerative changes first MTP joint with joint space narrowing and spur formation. Remain joint spaces preserved. No acute fracture, dislocation or bone destruction. Soft tissue swelling at dorsum of RIGHT foot increased since previous exam. IMPRESSION: Degenerative changes RIGHT first MTP joint. Increased soft tissue swelling at dorsum of RIGHT foot. No acute osseous abnormalities. Electronically Signed   By: Lavonia Dana M.D.   On: 12/24/2016 16:16     Assessment & Plan:  Plan  I have discontinued Ms. Waggle's furosemide and losartan. I am also having her maintain her aspirin, Calcium, Fish Oil, vitamin E, cholecalciferol, Multiple Vitamins-Minerals (MULTIVITAMIN PO), Diclofenac Sodium (PENNSAID TD), buPROPion, atorvastatin, and olmesartan.  Meds ordered this encounter  Medications  . DISCONTD: olmesartan (BENICAR) 40 MG tablet    Sig: Take 1 tablet (40 mg total) by mouth daily.    Dispense:  30 tablet    Refill:  2  . olmesartan (BENICAR) 40 MG tablet    Sig: Take 1 tablet (40 mg total) by mouth daily.    Dispense:  30 tablet    Refill:  2    Problem List Items Addressed This Visit      Unprioritized   Essential hypertension    Poorly controlled will alter medications, encouraged DASH diet, minimize caffeine and obtain adequate sleep. Report concerning symptoms and follow up as directed and as needed      Relevant Medications   olmesartan (BENICAR) 40 MG tablet   Other Relevant Orders   Lipid panel (Completed)   Comprehensive metabolic panel (Completed)   TSH (Completed)   Hyperlipidemia LDL  goal <100    Tolerating statin, encouraged heart healthy diet, avoid trans fats, minimize simple carbs and saturated fats. Increase exercise as tolerated      Relevant Medications   olmesartan (BENICAR) 40 MG tablet   Other Relevant Orders   Lipid panel (Completed)   Comprehensive metabolic panel (Completed)   TSH (Completed)    Other Visit Diagnoses    Encounter for Medicare annual wellness exam    -  Primary      Follow-up: Return in about 3 weeks (around 06/17/2017), or bp check , for hypertension.  Ann Held, DO

## 2017-05-29 MED ORDER — ATORVASTATIN CALCIUM 20 MG PO TABS: 20.0000 mg | ORAL_TABLET | Freq: Every day | ORAL | 0 refills | Status: DC

## 2017-05-29 NOTE — Assessment & Plan Note (Signed)
Poorly controlled will alter medications, encouraged DASH diet, minimize caffeine and obtain adequate sleep. Report concerning symptoms and follow up as directed and as needed 

## 2017-05-29 NOTE — Assessment & Plan Note (Signed)
Tolerating statin, encouraged heart healthy diet, avoid trans fats, minimize simple carbs and saturated fats. Increase exercise as tolerated 

## 2017-05-30 ENCOUNTER — Other Ambulatory Visit: Payer: Self-pay

## 2017-05-30 ENCOUNTER — Encounter: Payer: Self-pay | Admitting: Family Medicine

## 2017-05-30 DIAGNOSIS — E7849 Other hyperlipidemia: Secondary | ICD-10-CM

## 2017-05-30 DIAGNOSIS — I10 Essential (primary) hypertension: Secondary | ICD-10-CM

## 2017-06-01 ENCOUNTER — Encounter: Payer: Self-pay | Admitting: Family Medicine

## 2017-06-14 ENCOUNTER — Ambulatory Visit (INDEPENDENT_AMBULATORY_CARE_PROVIDER_SITE_OTHER): Payer: Medicare HMO | Admitting: Internal Medicine

## 2017-06-14 ENCOUNTER — Other Ambulatory Visit (INDEPENDENT_AMBULATORY_CARE_PROVIDER_SITE_OTHER): Payer: Medicare HMO

## 2017-06-14 VITALS — BP 129/77 | HR 74

## 2017-06-14 DIAGNOSIS — I10 Essential (primary) hypertension: Secondary | ICD-10-CM | POA: Diagnosis not present

## 2017-06-14 NOTE — Progress Notes (Signed)
Pre visit review using our clinic review tool, if applicable. No additional management support is needed unless otherwise documented below in the visit note.  Patient presents in office for blood pressure check per OV note 05/27/17. She reported compliance with medication. Patient is asymptomatic. The following readings were obtained during today's visit: BP 129/77 P 74 & 02 97%.  Per Dr. Larose Kells: Continue current medication & regimen. Complete BMP (Basic Metabolic Panel) lab work today. Follow-up with PCP as directed in the last office visit.  Patient was made aware of the provider's recommendations & verbalized understanding.  Kathlene November, MD

## 2017-06-14 NOTE — Patient Instructions (Addendum)
Per Dr. Larose Kells: Continue current medication & regimen. Complete BMP (Basic Metabolic Panel) lab work today. Follow-up with PCP as directed in the last office visit.

## 2017-06-15 ENCOUNTER — Encounter: Payer: Self-pay | Admitting: Family Medicine

## 2017-06-15 ENCOUNTER — Telehealth: Payer: Self-pay

## 2017-06-15 DIAGNOSIS — I10 Essential (primary) hypertension: Secondary | ICD-10-CM

## 2017-06-15 LAB — BASIC METABOLIC PANEL
BUN: 18 mg/dL (ref 6–23)
CHLORIDE: 99 meq/L (ref 96–112)
CO2: 36 mEq/L — ABNORMAL HIGH (ref 19–32)
CREATININE: 0.66 mg/dL (ref 0.40–1.20)
Calcium: 9.5 mg/dL (ref 8.4–10.5)
GFR: 94.12 mL/min (ref >60.00)
Glucose, Bld: 82 mg/dL (ref 70–99)
POTASSIUM: 3.9 meq/L (ref 3.5–5.1)
Sodium: 138 mEq/L (ref 135–145)

## 2017-06-15 MED ORDER — OLMESARTAN MEDOXOMIL 40 MG PO TABS: 40.0000 mg | ORAL_TABLET | Freq: Every day | ORAL | 2 refills | Status: DC

## 2017-06-15 NOTE — Telephone Encounter (Signed)
I don't see that we've written for it in over 1 yr. If this is the case, I would want her to see Dr. Etter Sjogren before restarting. If she has been taking it routinely, OK to reorder. TY.

## 2017-06-15 NOTE — Telephone Encounter (Signed)
Rx Benicar sent to pharmacy. Patient would also like to order hydrochlorothiazide 25 mg but it's not on her med list. Please advise. LB

## 2017-06-16 MED ORDER — OLMESARTAN MEDOXOMIL 40 MG PO TABS: 40.0000 mg | ORAL_TABLET | Freq: Every day | ORAL | 0 refills | Status: DC

## 2017-06-16 MED ORDER — HYDROCHLOROTHIAZIDE 25 MG PO TABS: 25.0000 mg | ORAL_TABLET | Freq: Every day | ORAL | 0 refills | Status: DC

## 2017-06-16 NOTE — Telephone Encounter (Signed)
Left message on pt's vm to give the office a call back, in regards to Rx refill request for hydrochlorothiazide 25 mg. LB

## 2017-06-16 NOTE — Telephone Encounter (Signed)
Spoke with pt, pt state original message on my chart from PCP state that provider instruction pt to add Htcz 25 mg if her blood pressure continues to elevate. My chart message from 06/01/17 state the following:40 mg is the highest dose--- add hctz 25 mg 1 po qd #30 ---- just eat banana or drink oj to prevent potassium from going down, per Dr. Etter Sjogren. Rx sent to pharmacy. Patient had no further questions or concerns at this time. LB

## 2017-06-17 ENCOUNTER — Encounter: Payer: Self-pay | Admitting: Family Medicine

## 2017-06-20 ENCOUNTER — Other Ambulatory Visit: Payer: Self-pay | Admitting: Obstetrics and Gynecology

## 2017-06-20 DIAGNOSIS — N644 Mastodynia: Secondary | ICD-10-CM

## 2017-06-23 ENCOUNTER — Ambulatory Visit
Admission: RE | Admit: 2017-06-23 | Discharge: 2017-06-23 | Disposition: A | Discharge status: Home / Self Care | Payer: Medicare HMO | Source: Ambulatory Visit | Attending: Obstetrics and Gynecology | Admitting: Obstetrics and Gynecology

## 2017-06-23 ENCOUNTER — Other Ambulatory Visit: Payer: Medicare HMO

## 2017-06-23 DIAGNOSIS — N644 Mastodynia: Secondary | ICD-10-CM

## 2017-06-23 DIAGNOSIS — R922 Inconclusive mammogram: Secondary | ICD-10-CM | POA: Diagnosis not present

## 2017-06-23 DIAGNOSIS — N6489 Other specified disorders of breast: Secondary | ICD-10-CM | POA: Diagnosis not present

## 2017-06-23 LAB — HM MAMMOGRAPHY

## 2017-07-12 ENCOUNTER — Other Ambulatory Visit: Payer: Self-pay

## 2017-07-12 DIAGNOSIS — I10 Essential (primary) hypertension: Secondary | ICD-10-CM

## 2017-07-12 MED ORDER — OLMESARTAN MEDOXOMIL 40 MG PO TABS: 40.0000 mg | ORAL_TABLET | Freq: Every day | ORAL | 0 refills | Status: DC

## 2017-08-23 ENCOUNTER — Encounter: Payer: Self-pay | Admitting: Family Medicine

## 2017-08-23 DIAGNOSIS — Z23 Encounter for immunization: Secondary | ICD-10-CM | POA: Diagnosis not present

## 2017-08-29 ENCOUNTER — Other Ambulatory Visit (INDEPENDENT_AMBULATORY_CARE_PROVIDER_SITE_OTHER): Payer: Medicare HMO

## 2017-08-29 DIAGNOSIS — I10 Essential (primary) hypertension: Secondary | ICD-10-CM

## 2017-08-29 DIAGNOSIS — E7849 Other hyperlipidemia: Secondary | ICD-10-CM

## 2017-08-29 LAB — COMPREHENSIVE METABOLIC PANEL
ALT: 15 U/L (ref 0–35)
AST: 18 U/L (ref 0–37)
Albumin: 4.3 g/dL (ref 3.5–5.2)
Alkaline Phosphatase: 56 U/L (ref 39–117)
BILIRUBIN TOTAL: 0.4 mg/dL (ref 0.2–1.2)
BUN: 10 mg/dL (ref 6–23)
CHLORIDE: 97 meq/L (ref 96–112)
CO2: 31 meq/L (ref 19–32)
CREATININE: 0.53 mg/dL (ref 0.40–1.20)
Calcium: 9.2 mg/dL (ref 8.4–10.5)
GFR: 121.16 mL/min (ref >60.00)
GLUCOSE: 94 mg/dL (ref 70–99)
Potassium: 3.9 mEq/L (ref 3.5–5.1)
Sodium: 135 mEq/L (ref 135–145)
Total Protein: 6.8 g/dL (ref 6.0–8.3)

## 2017-08-29 LAB — LIPID PANEL
Cholesterol: 149 mg/dL (ref 0–200)
HDL: 66 mg/dL (ref >39.00)
LDL Cholesterol: 73 mg/dL (ref 0–99)
NonHDL: 82.67
TRIGLYCERIDES: 50 mg/dL (ref 0.0–149.0)
Total CHOL/HDL Ratio: 2
VLDL: 10 mg/dL (ref 0.0–40.0)

## 2017-08-29 LAB — HEMOGLOBIN A1C: Hgb A1c MFr Bld: 6 % (ref 4.6–6.5)

## 2017-09-05 ENCOUNTER — Other Ambulatory Visit: Payer: Self-pay | Admitting: Family Medicine

## 2017-09-05 DIAGNOSIS — E785 Hyperlipidemia, unspecified: Secondary | ICD-10-CM

## 2017-09-07 DIAGNOSIS — Z01419 Encounter for gynecological examination (general) (routine) without abnormal findings: Secondary | ICD-10-CM | POA: Diagnosis not present

## 2017-09-07 DIAGNOSIS — Z6822 Body mass index (BMI) 22.0-22.9, adult: Secondary | ICD-10-CM | POA: Diagnosis not present

## 2017-09-07 DIAGNOSIS — M8588 Other specified disorders of bone density and structure, other site: Secondary | ICD-10-CM | POA: Diagnosis not present

## 2017-09-07 DIAGNOSIS — N958 Other specified menopausal and perimenopausal disorders: Secondary | ICD-10-CM | POA: Diagnosis not present

## 2017-09-13 ENCOUNTER — Other Ambulatory Visit: Payer: Self-pay | Admitting: Family Medicine

## 2017-09-13 DIAGNOSIS — I10 Essential (primary) hypertension: Secondary | ICD-10-CM

## 2017-10-05 DIAGNOSIS — H25813 Combined forms of age-related cataract, bilateral: Secondary | ICD-10-CM | POA: Diagnosis not present

## 2017-11-02 ENCOUNTER — Other Ambulatory Visit: Payer: Self-pay | Admitting: Family Medicine

## 2017-11-04 ENCOUNTER — Other Ambulatory Visit: Payer: Self-pay | Admitting: Family Medicine

## 2017-11-04 DIAGNOSIS — E785 Hyperlipidemia, unspecified: Secondary | ICD-10-CM

## 2017-11-11 ENCOUNTER — Other Ambulatory Visit: Payer: Self-pay | Admitting: Family Medicine

## 2017-11-11 ENCOUNTER — Encounter: Payer: Self-pay | Admitting: Family Medicine

## 2017-11-11 DIAGNOSIS — E785 Hyperlipidemia, unspecified: Secondary | ICD-10-CM

## 2017-12-22 ENCOUNTER — Other Ambulatory Visit: Payer: Self-pay | Admitting: Family Medicine

## 2017-12-22 DIAGNOSIS — I10 Essential (primary) hypertension: Secondary | ICD-10-CM

## 2018-01-02 ENCOUNTER — Ambulatory Visit (INDEPENDENT_AMBULATORY_CARE_PROVIDER_SITE_OTHER): Payer: Medicare HMO | Admitting: Family Medicine

## 2018-01-02 ENCOUNTER — Encounter: Payer: Self-pay | Admitting: Family Medicine

## 2018-01-02 ENCOUNTER — Telehealth: Payer: Self-pay | Admitting: Family Medicine

## 2018-01-02 VITALS — BP 140/72 | HR 71 | Temp 97.9°F | Resp 16 | Ht 62.0 in | Wt 121.4 lb

## 2018-01-02 DIAGNOSIS — Z8249 Family history of ischemic heart disease and other diseases of the circulatory system: Secondary | ICD-10-CM | POA: Diagnosis not present

## 2018-01-02 DIAGNOSIS — Z0000 Encounter for general adult medical examination without abnormal findings: Secondary | ICD-10-CM

## 2018-01-02 DIAGNOSIS — I10 Essential (primary) hypertension: Secondary | ICD-10-CM

## 2018-01-02 DIAGNOSIS — E785 Hyperlipidemia, unspecified: Secondary | ICD-10-CM

## 2018-01-02 LAB — COMPREHENSIVE METABOLIC PANEL
ALBUMIN: 4.5 g/dL (ref 3.5–5.2)
ALK PHOS: 56 U/L (ref 39–117)
ALT: 17 U/L (ref 0–35)
AST: 21 U/L (ref 0–37)
BILIRUBIN TOTAL: 0.4 mg/dL (ref 0.2–1.2)
BUN: 11 mg/dL (ref 6–23)
CO2: 33 mEq/L — ABNORMAL HIGH (ref 19–32)
Calcium: 9.9 mg/dL (ref 8.4–10.5)
Chloride: 96 mEq/L (ref 96–112)
Creatinine, Ser: 0.59 mg/dL (ref 0.40–1.20)
GFR: 106.95 mL/min (ref >60.00)
GLUCOSE: 98 mg/dL (ref 70–99)
Potassium: 3.4 mEq/L — ABNORMAL LOW (ref 3.5–5.1)
SODIUM: 135 meq/L (ref 135–145)
Total Protein: 7 g/dL (ref 6.0–8.3)

## 2018-01-02 LAB — LIPID PANEL
CHOLESTEROL: 164 mg/dL (ref 0–200)
HDL: 74.9 mg/dL (ref >39.00)
LDL Cholesterol: 81 mg/dL (ref 0–99)
NonHDL: 88.72
TRIGLYCERIDES: 38 mg/dL (ref 0.0–149.0)
Total CHOL/HDL Ratio: 2
VLDL: 7.6 mg/dL (ref 0.0–40.0)

## 2018-01-02 LAB — CBC WITH DIFFERENTIAL/PLATELET
BASOS ABS: 0.1 10*3/uL (ref 0.0–0.1)
Basophils Relative: 1 % (ref 0.0–3.0)
EOS ABS: 0.4 10*3/uL (ref 0.0–0.7)
Eosinophils Relative: 8 % — ABNORMAL HIGH (ref 0.0–5.0)
HCT: 36.2 % (ref 36.0–46.0)
HEMOGLOBIN: 12.3 g/dL (ref 12.0–15.0)
LYMPHS ABS: 1.5 10*3/uL (ref 0.7–4.0)
Lymphocytes Relative: 29.9 % (ref 12.0–46.0)
MCHC: 34 g/dL (ref 30.0–36.0)
MCV: 95.3 fl (ref 78.0–100.0)
MONO ABS: 0.5 10*3/uL (ref 0.1–1.0)
Monocytes Relative: 9.7 % (ref 3.0–12.0)
NEUTROS PCT: 51.4 % (ref 43.0–77.0)
Neutro Abs: 2.5 10*3/uL (ref 1.4–7.7)
Platelets: 279 10*3/uL (ref 150.0–400.0)
RBC: 3.8 Mil/uL — AB (ref 3.87–5.11)
RDW: 13.4 % (ref 11.5–15.5)
WBC: 5 10*3/uL (ref 4.0–10.5)

## 2018-01-02 NOTE — Progress Notes (Signed)
Subjective:     Natasha Trujillo is a 72 y.o. female and is here for a comprehensive physical exam. The patient reports no problems. She has an extensive fam hx of cardiac problems and would like a Ca score done-- Social History   Socioeconomic History  . Marital status: Married    Spouse name: Not on file  . Number of children: Not on file  . Years of education: Not on file  . Highest education level: Not on file  Social Needs  . Financial resource strain: Not on file  . Food insecurity - worry: Not on file  . Food insecurity - inability: Not on file  . Transportation needs - medical: Not on file  . Transportation needs - non-medical: Not on file  Occupational History  . Occupation: retired    Fish farm manager: RETIRED  Tobacco Use  . Smoking status: Never Smoker  . Smokeless tobacco: Never Used  Substance and Sexual Activity  . Alcohol use: Yes    Comment: occasional  . Drug use: No  . Sexual activity: Yes    Partners: Male  Other Topics Concern  . Not on file  Social History Narrative   Exercise--walk 4 miles 5 days a week   Health Maintenance  Topic Date Due  . MAMMOGRAM  08/27/2016  . TETANUS/TDAP  07/26/2018  . COLONOSCOPY  08/21/2018  . INFLUENZA VACCINE  Completed  . DEXA SCAN  Completed  . Hepatitis C Screening  Completed  . PNA vac Low Risk Adult  Completed    The following portions of the patient's history were reviewed and updated as appropriate:  She  has a past medical history of Hyperlipidemia, Hypertension, and Osteopenia. She does not have any pertinent problems on file. She  has a past surgical history that includes Tubal ligation; Knee surgery (Left, 09/12/13); Blepharoplasty (Bilateral, 08/2016); and Shoulder surgery (Right, 01/05/2017). Her family history includes Cancer (age of onset: 47) in her maternal aunt; Cervical cancer in her unknown relative; Coronary artery disease in her unknown relative; Dementia in her mother; Diabetes in her other;  Diabetes (age of onset: 22) in her brother; Heart disease in her brother; Heart disease (age of onset: 54) in her father and mother; Hypertension in her brother, father, and mother; Lung cancer in her unknown relative. She  reports that  has never smoked. she has never used smokeless tobacco. She reports that she drinks alcohol. She reports that she does not use drugs. She has a current medication list which includes the following prescription(s): aspirin, atorvastatin, bupropion, calcium, cholecalciferol, diclofenac sodium, hydrochlorothiazide, multiple vitamins-minerals, olmesartan, fish oil, and vitamin e. Current Outpatient Medications on File Prior to Visit  Medication Sig Dispense Refill  . aspirin 81 MG EC tablet Take 81 mg by mouth.      Marland Kitchen atorvastatin (LIPITOR) 20 MG tablet TAKE 1 TABLET EVERY DAY 90 tablet 0  . buPROPion (WELLBUTRIN XL) 150 MG 24 hr tablet TAKE 1 TABLET EVERY MORNING 90 tablet 1  . Calcium 1500 MG tablet Take 1,500 mg by mouth.      . cholecalciferol (VITAMIN D) 1000 UNITS tablet Take 1,000 Units by mouth.      . Diclofenac Sodium (PENNSAID TD) Place 1 application onto the skin daily.    . hydrochlorothiazide (HYDRODIURIL) 25 MG tablet TAKE 1 TABLET EVERY DAY 90 tablet 0  . Multiple Vitamins-Minerals (MULTIVITAMIN PO) Take 1 tablet by mouth daily.    Marland Kitchen olmesartan (BENICAR) 40 MG tablet TAKE 1 TABLET EVERY DAY  90 tablet 2  . Omega-3 Fatty Acids (FISH OIL) 1000 MG CAPS Take 1 capsule by mouth daily.      . vitamin E 400 UNIT capsule Take 400 Units by mouth.       No current facility-administered medications on file prior to visit.    She is allergic to adrenalin [epinephrine] and lisinopril..  Review of Systems Review of Systems  Constitutional: Negative for activity change, appetite change and fatigue.  HENT: Negative for hearing loss, congestion, tinnitus and ear discharge.  dentist q17m Eyes: Negative for visual disturbance (see optho q1y -- vision corrected to  20/20 with glasses).  Respiratory: Negative for cough, chest tightness and shortness of breath.   Cardiovascular: Negative for chest pain, palpitations and leg swelling.  Gastrointestinal: Negative for abdominal pain, diarrhea, constipation and abdominal distention.  Genitourinary: Negative for urgency, frequency, decreased urine volume and difficulty urinating.  Musculoskeletal: Negative for back pain, arthralgias and gait problem.  Skin: Negative for color change, pallor and rash.  Neurological: Negative for dizziness, light-headedness, numbness and headaches.  Hematological: Negative for adenopathy. Does not bruise/bleed easily.  Psychiatric/Behavioral: Negative for suicidal ideas, confusion, sleep disturbance, self-injury, dysphoric mood, decreased concentration and agitation.       Objective:    BP 140/72   Pulse 71   Temp 97.9 F (36.6 C) (Oral)   Resp 16   Ht 5\' 2"  (1.575 m)   Wt 121 lb 6.4 oz (55.1 kg)   SpO2 97%   BMI 22.20 kg/m  General appearance: alert, cooperative, appears stated age and no distress Head: Normocephalic, without obvious abnormality, atraumatic Eyes: negative findings: lids and lashes normal, conjunctivae and sclerae normal and pupils equal, round, reactive to light and accomodation Ears: normal TM's and external ear canals both ears Nose: Nares normal. Septum midline. Mucosa normal. No drainage or sinus tenderness. Throat: lips, mucosa, and tongue normal; teeth and gums normal Neck: no adenopathy, no carotid bruit, no JVD, supple, symmetrical, trachea midline and thyroid not enlarged, symmetric, no tenderness/mass/nodules Back: symmetric, no curvature. ROM normal. No CVA tenderness. Lungs: clear to auscultation bilaterally Breasts: gyn Heart: regular rate and rhythm, S1, S2 normal, no murmur, click, rub or gallop Abdomen: soft, non-tender; bowel sounds normal; no masses,  no organomegaly Pelvic: deferred--gyn Extremities: extremities normal,  atraumatic, no cyanosis or edema Pulses: 2+ and symmetric Skin: Skin color, texture, turgor normal. No rashes or lesions Lymph nodes: Cervical, supraclavicular, and axillary nodes normal. Neurologic: Alert and oriented X 3, normal strength and tone. Normal symmetric reflexes. Normal coordination and gait    Assessment:    Healthy female exam.      Plan:    ghm utd Check labs See After Visit Summary for Counseling Recommendations    1. Hyperlipidemia LDL goal <100 Tolerating statin, encouraged heart healthy diet, avoid trans fats, minimize simple carbs and saturated fats. Increase exercise as tolerated - Comprehensive metabolic panel - CBC with Differential/Platelet - Lipid panel - CT CARDIAC MORPH/PULM VEIN W/CM&W/O CA SCORE; Future  2. Essential hypertension Well controlled, no changes to meds. Encouraged heart healthy diet such as the DASH diet and exercise as tolerated.  - Comprehensive metabolic panel - CBC with Differential/Platelet - Lipid panel - CT CARDIAC MORPH/PULM VEIN W/CM&W/O CA SCORE; Future  3. Preventative health care See above   4. Family history of early CAD  - CT CARDIAC MORPH/PULM VEIN W/CM&W/O CA SCORE; Future

## 2018-01-02 NOTE — Telephone Encounter (Signed)
Copied from [place] [id number]. Topic: Quick Communication - See Telephone Encounter >> Jan 02, 2018  3:26 PM Vernona Rieger wrote: CRM for notification. See Telephone encounter for:   01/02/18.  [facility] network called and stated that they need a Prior Autho for the CTA they are working on. They are missing some information for this. Please advise  Call back is [phone] Donata Clay)

## 2018-01-02 NOTE — Patient Instructions (Signed)
Preventive Care 65 Years and Older, Female Preventive care refers to lifestyle choices and visits with your health care provider that can promote health and wellness. What does preventive care include?  A yearly physical exam. This is also called an annual well check.  Dental exams once or twice a year.  Routine eye exams. Ask your health care provider how often you should have your eyes checked.  Personal lifestyle choices, including: ? Daily care of your teeth and gums. ? Regular physical activity. ? Eating a healthy diet. ? Avoiding tobacco and drug use. ? Limiting alcohol use. ? Practicing safe sex. ? Taking low-dose aspirin every day. ? Taking vitamin and mineral supplements as recommended by your health care provider. What happens during an annual well check? The services and screenings done by your health care provider during your annual well check will depend on your age, overall health, lifestyle risk factors, and family history of disease. Counseling Your health care provider may ask you questions about your:  Alcohol use.  Tobacco use.  Drug use.  Emotional well-being.  Home and relationship well-being.  Sexual activity.  Eating habits.  History of falls.  Memory and ability to understand (cognition).  Work and work environment.  Reproductive health.  Screening You may have the following tests or measurements:  Height, weight, and BMI.  Blood pressure.  Lipid and cholesterol levels. These may be checked every 5 years, or more frequently if you are over 50 years old.  Skin check.  Lung cancer screening. You may have this screening every year starting at age 55 if you have a 30-pack-year history of smoking and currently smoke or have quit within the past 15 years.  Fecal occult blood test (FOBT) of the stool. You may have this test every year starting at age 50.  Flexible sigmoidoscopy or colonoscopy. You may have a sigmoidoscopy every 5 years or  a colonoscopy every 10 years starting at age 50.  Hepatitis C blood test.  Hepatitis B blood test.  Sexually transmitted disease (STD) testing.  Diabetes screening. This is done by checking your blood sugar (glucose) after you have not eaten for a while (fasting). You may have this done every 1-3 years.  Bone density scan. This is done to screen for osteoporosis. You may have this done starting at age 65.  Mammogram. This may be done every 1-2 years. Talk to your health care provider about how often you should have regular mammograms.  Talk with your health care provider about your test results, treatment options, and if necessary, the need for more tests. Vaccines Your health care provider may recommend certain vaccines, such as:  Influenza vaccine. This is recommended every year.  Tetanus, diphtheria, and acellular pertussis (Tdap, Td) vaccine. You may need a Td booster every 10 years.  Varicella vaccine. You may need this if you have not been vaccinated.  Zoster vaccine. You may need this after age 60.  Measles, mumps, and rubella (MMR) vaccine. You may need at least one dose of MMR if you were born in 1957 or later. You may also need a second dose.  Pneumococcal 13-valent conjugate (PCV13) vaccine. One dose is recommended after age 65.  Pneumococcal polysaccharide (PPSV23) vaccine. One dose is recommended after age 65.  Meningococcal vaccine. You may need this if you have certain conditions.  Hepatitis A vaccine. You may need this if you have certain conditions or if you travel or work in places where you may be exposed to hepatitis   A.  Hepatitis B vaccine. You may need this if you have certain conditions or if you travel or work in places where you may be exposed to hepatitis B.  Haemophilus influenzae type b (Hib) vaccine. You may need this if you have certain conditions.  Talk to your health care provider about which screenings and vaccines you need and how often you  need them. This information is not intended to replace advice given to you by your health care provider. Make sure you discuss any questions you have with your health care provider. Document Released: 11/21/2015 Document Revised: 07/14/2016 Document Reviewed: 08/26/2015 Elsevier Interactive Patient Education  2018 [employer]  

## 2018-01-11 ENCOUNTER — Encounter: Payer: Self-pay | Admitting: Family Medicine

## 2018-01-11 ENCOUNTER — Telehealth: Payer: Self-pay | Admitting: Family Medicine

## 2018-01-11 ENCOUNTER — Ambulatory Visit: Payer: Medicare HMO | Admitting: Medical

## 2018-01-11 DIAGNOSIS — Z20828 Contact with and (suspected) exposure to other viral communicable diseases: Secondary | ICD-10-CM

## 2018-01-11 MED ORDER — OSELTAMIVIR PHOSPHATE 30 MG PO CAPS: 30.0000 mg | ORAL_CAPSULE | Freq: Two times a day (BID) | ORAL | 0 refills | Status: DC

## 2018-01-11 NOTE — Telephone Encounter (Signed)
Pt of Dr. Etter Sjogren- her husband Natasha Trujillo is seeing me today with likely flu.  Called [facility] and did not reach her, but he thinks she would like a course of tamiflu. She is feeling achy and bad herself today.  rx for tamiflu to her drug store.  Adjusted dose for creat clearance   Meds ordered this encounter  Medications  . oseltamivir (TAMIFLU) 30 MG capsule    Sig: Take 1 capsule (30 mg total) by mouth 2 (two) times daily.    Dispense:  10 capsule    Refill:  0

## 2018-01-12 ENCOUNTER — Encounter: Payer: Self-pay | Admitting: Family Medicine

## 2018-01-17 ENCOUNTER — Other Ambulatory Visit: Payer: Self-pay | Admitting: *Deleted

## 2018-01-17 NOTE — Progress Notes (Signed)
Ct

## 2018-01-20 ENCOUNTER — Other Ambulatory Visit: Payer: Self-pay | Admitting: *Deleted

## 2018-01-20 DIAGNOSIS — I10 Essential (primary) hypertension: Secondary | ICD-10-CM

## 2018-01-20 DIAGNOSIS — E785 Hyperlipidemia, unspecified: Secondary | ICD-10-CM

## 2018-01-20 DIAGNOSIS — Z8249 Family history of ischemic heart disease and other diseases of the circulatory system: Secondary | ICD-10-CM

## 2018-01-24 DIAGNOSIS — Z8249 Family history of ischemic heart disease and other diseases of the circulatory system: Secondary | ICD-10-CM | POA: Insufficient documentation

## 2018-01-24 NOTE — Progress Notes (Signed)
Cardiology Office Note    Date:  01/25/2018   ID:  Natasha, Trujillo 03/23/1946, MRN [id number]  PCP:  Carollee Herter, Alferd Apa, DO  Cardiologist: Sinclair Grooms, MD   Chief Complaint  Patient presents with  . Hypertension  . Hyperlipidemia  . Advice Only    Family history    History of Present Illness:  Natasha Trujillo is a 72 y.o. female referred by Dr. Kendrick Fries L. Chase, DO for recommendations concerning hypertension control and primary prevention given family history of premature CAD.  Patient is relatively healthy from a physical standpoint.  For years she has walked between 4 and 5 miles 5 times per week and has not noticed any decrease in exertional tolerance, dyspnea, or chest complaints.  She is here today because of concerns with elevated blood pressure despite reasonable medical therapy.  She is also concerned because there is a significant family history of coronary artery disease.  Has moderate dyspnea when she walks on a treadmill or uses resistance equipment.  Past Medical History:  Diagnosis Date  . Hyperlipidemia   . Hypertension   . Osteopenia     Past Surgical History:  Procedure Laterality Date  . BLEPHAROPLASTY Bilateral 08/2016   Dr Shelbie Proctor  . KNEE SURGERY Left 09/12/13  . SHOULDER SURGERY Right 01/05/2017   Dr.Caffrey  . TUBAL LIGATION      Current Medications: Outpatient Medications Prior to Visit  Medication Sig Dispense Refill  . buPROPion (WELLBUTRIN XL) 150 MG 24 hr tablet Take 150 mg by mouth daily.    . Calcium 1500 MG tablet Take 1,500 mg by mouth daily.     . Cholecalciferol (VITAMIN D-3 PO) Take 800 Units by mouth daily.    . hydrochlorothiazide (HYDRODIURIL) 25 MG tablet Take 25 mg by mouth daily.    . Multiple Vitamins-Minerals (MULTIVITAMIN PO) Take 1 tablet by mouth daily.    Marland Kitchen olmesartan (BENICAR) 40 MG tablet Take 40 mg by mouth daily.    . Omega-3 Fatty Acids (FISH OIL) 1000 MG CAPS Take 1 capsule by mouth  daily.      . vitamin E 400 UNIT capsule Take 400 Units by mouth daily.     Marland Kitchen atorvastatin (LIPITOR) 20 MG tablet Take 20 mg by mouth daily.    Marland Kitchen aspirin 81 MG EC tablet Take 81 mg by mouth.      Marland Kitchen atorvastatin (LIPITOR) 20 MG tablet TAKE 1 TABLET EVERY DAY (Patient not taking: Reported on 01/25/2018) 90 tablet 0  . buPROPion (WELLBUTRIN XL) 150 MG 24 hr tablet TAKE 1 TABLET EVERY MORNING (Patient not taking: Reported on 01/25/2018) 90 tablet 1  . cholecalciferol (VITAMIN D) 1000 UNITS tablet Take 1,000 Units by mouth.      . Diclofenac Sodium (PENNSAID TD) Place 1 application onto the skin daily.    . hydrochlorothiazide (HYDRODIURIL) 25 MG tablet TAKE 1 TABLET EVERY DAY (Patient not taking: Reported on 01/25/2018) 90 tablet 0  . olmesartan (BENICAR) 40 MG tablet TAKE 1 TABLET EVERY DAY (Patient not taking: Reported on 01/25/2018) 90 tablet 2  . oseltamivir (TAMIFLU) 30 MG capsule Take 1 capsule (30 mg total) by mouth 2 (two) times daily. (Patient not taking: Reported on 01/25/2018) 10 capsule 0   No facility-administered medications prior to visit.      Allergies:   Adrenalin [epinephrine] and Lisinopril   Social History   Socioeconomic History  . Marital status: Married    Spouse name: None  .  Number of children: None  . Years of education: None  . Highest education level: None  Social Needs  . Financial resource strain: None  . Food insecurity - worry: None  . Food insecurity - inability: None  . Transportation needs - medical: None  . Transportation needs - non-medical: None  Occupational History  . Occupation: retired    Fish farm manager: RETIRED  Tobacco Use  . Smoking status: Never Smoker  . Smokeless tobacco: Never Used  Substance and Sexual Activity  . Alcohol use: Yes    Comment: occasional  . Drug use: No  . Sexual activity: Yes    Partners: Male  Other Topics Concern  . None  Social History Narrative   Exercise--walk 4 miles 5 days a week     Family History:  The  patient's family history includes Cancer (age of onset: 23) in her maternal aunt; Cervical cancer in her unknown relative; Coronary artery disease in her unknown relative; Dementia in her mother; Diabetes in her other; Diabetes (age of onset: 3) in her brother; Heart attack (age of onset: 68) in her son; Heart disease in her brother; Heart disease (age of onset: 52) in her father and mother; Hypertension in her brother, father, and mother; Lung cancer in her unknown relative.   ROS:   Please see the history of present illness.    Irregular heartbeats with physical activity.  No chest discomfort. All other systems reviewed and are negative.   PHYSICAL EXAM:   VS:  BP (!) 150/84   Pulse 81   Ht 5\' 2"  (1.575 m)   Wt 121 lb 1.9 oz (54.9 kg)   BMI 22.15 kg/m    GEN: Well nourished, well developed, in no acute distress  HEENT: normal  Neck: no JVD, carotid bruits, or masses Cardiac: RRR; no murmurs, rubs, or gallops,no edema  Respiratory:  clear to auscultation bilaterally, normal work of breathing GI: soft, nontender, nondistended, + BS MS: no deformity or atrophy  Skin: warm and dry, no rash Neuro:  Alert and Oriented x 3, Strength and sensation are intact Psych: euthymic mood, full affect  Wt Readings from Last 3 Encounters:  01/25/18 121 lb 1.9 oz (54.9 kg)  01/02/18 121 lb 6.4 oz (55.1 kg)  05/27/17 120 lb (54.4 kg)      Studies/Labs Reviewed:   EKG:  EKG normal sinus rhythm with normal overall appearance.  Recent Labs: 05/27/2017: TSH 1.68 01/02/2018: ALT 17; BUN 11; Creatinine, Ser 0.59; Hemoglobin 12.3; Platelets 279.0; Potassium 3.4; Sodium 135   Lipid Panel    Component Value Date/Time   CHOL 164 01/02/2018 1351   TRIG 38.0 01/02/2018 1351   HDL 74.90 01/02/2018 1351   CHOLHDL 2 01/02/2018 1351   VLDL 7.6 01/02/2018 1351   LDLCALC 81 01/02/2018 1351    Additional studies/ records that were reviewed today include:  None    ASSESSMENT:    1. Essential  hypertension   2. Hyperlipidemia LDL goal <100   3. Family history of early CAD      PLAN:  In order of problems listed above:  1. Target blood pressure 130/80 mmHg.  Add metoprolol succinate 25 mg daily.  Continue olmesartan 40 mg/day and HCTZ 25 mg/day.  Check 2D Doppler echocardiogram given blood pressure elevation to exclude LVH. 2. Given family history LDL target less than 70.  Increase atorvastatin to 40 mg/day. 3. Family history CAD.  Aggressive risk factor modification/primary prevention with targets as follows: Hemoglobin A1c less than 6,  aerobic activity should continue as current, LDL target less than 70, and exercise treadmill test will be done to exclude silent ischemia.  Plan return with me in 6 months.  Blood pressure clinic in 1 month.  Exercise treadmill test.  Up titration of cholesterol medication.  And for now will continue aspirin 81 mg/day.  Have decided against coronary calcium scoring given that primary prevention is desired.  We will do the stress testing to exclude silent ischemia.  Liver and lipid panel in 4-6 weeks.  Medication Adjustments/Labs and Tests Ordered: Current medicines are reviewed at length with the patient today.  Concerns regarding medicines are outlined above.  Medication changes, Labs and Tests ordered today are listed in the Patient Instructions below. There are no Patient Instructions on file for this visit.   Signed, Sinclair Grooms, MD  01/25/2018 1:50 PM    [facility] [address], [place], [place]  [address] Phone: [phone]; Fax: [phone]

## 2018-01-25 ENCOUNTER — Encounter: Payer: Self-pay | Admitting: Interventional Cardiology

## 2018-01-25 ENCOUNTER — Ambulatory Visit: Payer: Medicare HMO | Admitting: Interventional Cardiology

## 2018-01-25 VITALS — BP 150/84 | HR 81 | Ht 62.0 in | Wt 121.1 lb

## 2018-01-25 DIAGNOSIS — Z8249 Family history of ischemic heart disease and other diseases of the circulatory system: Secondary | ICD-10-CM

## 2018-01-25 DIAGNOSIS — E785 Hyperlipidemia, unspecified: Secondary | ICD-10-CM | POA: Diagnosis not present

## 2018-01-25 DIAGNOSIS — I10 Essential (primary) hypertension: Secondary | ICD-10-CM

## 2018-01-25 MED ORDER — ATORVASTATIN CALCIUM 40 MG PO TABS: 40.0000 mg | ORAL_TABLET | Freq: Every day | ORAL | 3 refills | Status: DC

## 2018-01-25 MED ORDER — METOPROLOL SUCCINATE ER 25 MG PO TB24: 25.0000 mg | ORAL_TABLET | Freq: Every day | ORAL | 3 refills | Status: DC

## 2018-01-25 NOTE — Patient Instructions (Signed)
Medication Instructions:  1) INCREASE Atorvastatin to 40mg  once daily 2) START Metoprolol Succinate 25mg  once daily  Labwork: Your physician recommends that you return for lab work in: 6 weeks (Liver, Lipid).  Please come to this lab appointment fasting (nothing to eat or drink after midnight the night before).   Testing/Procedures: Your physician has requested that you have an echocardiogram. Echocardiography is a painless test that uses sound waves to create images of your heart. It provides your doctor with information about the size and shape of your heart and how well your heart's chambers and valves are working. This procedure takes approximately one hour. There are no restrictions for this procedure.  Your physician has requested that you have an exercise tolerance test. For further information please visit [email or web address]. Please also follow instruction sheet, as given.   Follow-Up: Your physician recommends that you schedule a follow-up appointment in: 1 month with our Hypertension Clinic.  Your physician wants you to follow-up in: 6 months with Dr. Tamala Julian.  You will receive a reminder letter in the mail two months in advance. If you don't receive a letter, please call our office to schedule the follow-up appointment.    Any Other Special Instructions Will Be Listed Below (If Applicable).     If you need a refill on your cardiac medications before your next appointment, please call your pharmacy.

## 2018-02-09 ENCOUNTER — Ambulatory Visit (HOSPITAL_COMMUNITY): Payer: Medicare HMO | Attending: Cardiology

## 2018-02-09 ENCOUNTER — Ambulatory Visit (INDEPENDENT_AMBULATORY_CARE_PROVIDER_SITE_OTHER): Payer: Medicare HMO

## 2018-02-09 ENCOUNTER — Other Ambulatory Visit: Payer: Self-pay

## 2018-02-09 DIAGNOSIS — E785 Hyperlipidemia, unspecified: Secondary | ICD-10-CM | POA: Diagnosis not present

## 2018-02-09 DIAGNOSIS — Z8249 Family history of ischemic heart disease and other diseases of the circulatory system: Secondary | ICD-10-CM | POA: Diagnosis not present

## 2018-02-09 DIAGNOSIS — I10 Essential (primary) hypertension: Secondary | ICD-10-CM | POA: Insufficient documentation

## 2018-02-09 DIAGNOSIS — I081 Rheumatic disorders of both mitral and tricuspid valves: Secondary | ICD-10-CM | POA: Diagnosis not present

## 2018-02-09 LAB — EXERCISE TOLERANCE TEST
CHL CUP RESTING HR STRESS: 71 {beats}/min
CHL RATE OF PERCEIVED EXERTION: 15
Estimated workload: 10.1 METS
Exercise duration (min): 9 min
Exercise duration (sec): 0 s
MPHR: 150 {beats}/min
Peak HR: 131 {beats}/min
Percent HR: 87 %

## 2018-03-01 ENCOUNTER — Ambulatory Visit (INDEPENDENT_AMBULATORY_CARE_PROVIDER_SITE_OTHER): Payer: Medicare HMO | Admitting: Pharmacist

## 2018-03-01 VITALS — BP 152/78 | HR 61

## 2018-03-01 DIAGNOSIS — I10 Essential (primary) hypertension: Secondary | ICD-10-CM

## 2018-03-01 MED ORDER — CARVEDILOL 3.125 MG PO TABS: 3.1250 mg | ORAL_TABLET | Freq: Two times a day (BID) | ORAL | 11 refills | Status: DC

## 2018-03-01 MED ORDER — CHLORTHALIDONE 25 MG PO TABS: 25.0000 mg | ORAL_TABLET | Freq: Every day | ORAL | 11 refills | Status: DC

## 2018-03-01 NOTE — Progress Notes (Signed)
Patient ID: [name]                 DOB: May 24, 1946                      MRN: [id number]     HPI: Natasha Trujillo is a 72 y.o. female referred by Dr. Tamala Trujillo to HTN clinic. PMH is significant for HTN and family history of premature CAD. She was seen by Dr Natasha Trujillo a month ago and started on Toprol 25mg  daily for additional BP lowering.  Patient presents today in good spirits. She reports feeling well since starting her metoprolol. Reports decrease in anxiety and improved HR which she notices in particular after meals. Reviewed previous HTN medications pt has tried which include LEE on amlodipine and throat swelling on lisinopril after taking ACEi for a few years. Updated our allergy list as we had listed cough as adverse event with lisinopril which pt has not experienced. Discussed that olmesartan is a similar medication to her lisinopril and counseled her on small possibility of developing similar throat tightness. No problems tolerating her ARB therapy thus far. She takes all of her BP medications at 8:30am and has not been checking her BP at home regularly.  Pt stays active and walks multiple miles most days a week. She eats a healthy diet low in salt. She does drink 4 cups of caffeinated coffee each day.  Current HTN meds: HCTZ 25mg  daily, olmesartan 40mg  daily, Toprol 25mg  daily Previously tried: lisinopril - cough, some throat swelling, amlodipine - LEE BP goal: <130/11mmHg  Family History: The patient's family history includes Cancer (age of onset: 10) in her maternal aunt; Cervical cancer in her unknown relative; Coronary artery disease in her unknown relative; Dementia in her mother; Diabetes in her other; Diabetes (age of onset: 46) in her brother; Heart attack (age of onset: 1) in her son; Heart disease in her brother; Heart disease (age of onset: 32) in her father and mother; Hypertension in her brother, father, and mother; Lung cancer in her unknown relative.   Social  History: Denies tobacco and illicit drug use. Occasionally drinks alcohol.  Diet: Fruit, whole wheat carbs, chicken and salmon. Avoids cold cuts. Does like goldfish andcheeze its. 4 cups of coffee each day  Exercise: Walks 4-5 miles 5 days a week  Wt Readings from Last 3 Encounters:  01/25/18 121 lb 1.9 oz (54.9 kg)  01/02/18 121 lb 6.4 oz (55.1 kg)  05/27/17 120 lb (54.4 kg)   BP Readings from Last 3 Encounters:  01/25/18 (!) 150/84  01/02/18 140/72  06/14/17 129/77   Pulse Readings from Last 3 Encounters:  01/25/18 81  01/02/18 71  06/14/17 74    Renal function: CrCl cannot be calculated (Patient's most recent lab result is older than the maximum 21 days allowed.).  Past Medical History:  Diagnosis Date  . Hyperlipidemia   . Hypertension   . Osteopenia     Current Outpatient Medications on File Prior to Visit  Medication Sig Dispense Refill  . atorvastatin (LIPITOR) 40 MG tablet Take 1 tablet (40 mg total) by mouth daily. 90 tablet 3  . buPROPion (WELLBUTRIN XL) 150 MG 24 hr tablet Take 150 mg by mouth daily.    . Calcium 1500 MG tablet Take 1,500 mg by mouth daily.     . Cholecalciferol (VITAMIN D-3 PO) Take 800 Units by mouth daily.    . hydrochlorothiazide (HYDRODIURIL) 25 MG tablet Take 25 mg  by mouth daily.    . metoprolol succinate (TOPROL-XL) 25 MG 24 hr tablet Take 1 tablet (25 mg total) by mouth daily. 90 tablet 3  . Multiple Vitamins-Minerals (MULTIVITAMIN PO) Take 1 tablet by mouth daily.    Marland Kitchen olmesartan (BENICAR) 40 MG tablet Take 40 mg by mouth daily.    . Omega-3 Fatty Acids (FISH OIL) 1000 MG CAPS Take 1 capsule by mouth daily.      . vitamin E 400 UNIT capsule Take 400 Units by mouth daily.      No current facility-administered medications on file prior to visit.     Allergies  Allergen Reactions  . Adrenalin [Epinephrine] Other (See Comments)    dizziness  . Lisinopril Cough     Assessment/Plan:  1. Hypertension - BP has not improved with  addition of metoprolol, although pt reports feeling less anxious. Goal BP < 130/56mmHg. Will optimize current medications as follows:  - Stop HCTZ 25mg  daily and start chlorthalidone 25mg  daily - Stop Toprol 25mg  daily and start equivalent dose carvedilol 3.125mg  BID for better BP lowering and to help with anxiety - Switch to decaf coffee - Canceled f/u lipid panel at our office next week as pt already scheduled to have lipids drawn at PCP a few days prior. Messaged her PCP to add on LFTs since she had a recent increase in her atorvastatin dose. BMET will be checked with these labs as well.  F/u in HTN clinic in 3 weeks. Can consider starting spironolactone if addition BP lowering is needed. Will not likely be able to titrate beta blocker therapy due to HR ~60 today, however will continue therapy more for improvement in anxiety symptoms.   Megan E. Supple, PharmD, CPP, [place] [address], [place], [place] [address] Phone: [phone]; Fax: [phone] 03/01/2018 11:58 AM

## 2018-03-01 NOTE — Patient Instructions (Addendum)
It was nice to see you today  Your blood pressure goal is < 130/43mmHg  STOP taking HCTZ and START taking chlorthalidone 25mg  once a day in the morning  STOP taking metoprolol and START taking carvedilol 3.125mg  twice daily  Move your olmsartan dosing to the evening when you take your second dose of carvedilol  Switch to decaf coffee  Follow up lab work at your primary care office next week  Follow up in blood pressure clinic in 3 weeks  Monitor your blood pressure readings at home and bring these to your follow up visit

## 2018-03-02 ENCOUNTER — Encounter: Payer: Self-pay | Admitting: Family Medicine

## 2018-03-06 ENCOUNTER — Other Ambulatory Visit (INDEPENDENT_AMBULATORY_CARE_PROVIDER_SITE_OTHER): Payer: Medicare HMO

## 2018-03-06 ENCOUNTER — Other Ambulatory Visit: Payer: Self-pay | Admitting: Family Medicine

## 2018-03-06 DIAGNOSIS — E785 Hyperlipidemia, unspecified: Secondary | ICD-10-CM

## 2018-03-06 LAB — HEMOGLOBIN A1C: HEMOGLOBIN A1C: 5.9 % (ref 4.6–6.5)

## 2018-03-06 LAB — LIPID PANEL
CHOL/HDL RATIO: 2
CHOLESTEROL: 139 mg/dL (ref 0–200)
HDL: 72.2 mg/dL (ref >39.00)
LDL CALC: 59 mg/dL (ref 0–99)
NonHDL: 66.97
TRIGLYCERIDES: 42 mg/dL (ref 0.0–149.0)
VLDL: 8.4 mg/dL (ref 0.0–40.0)

## 2018-03-06 LAB — COMPREHENSIVE METABOLIC PANEL
ALBUMIN: 4.4 g/dL (ref 3.5–5.2)
ALT: 18 U/L (ref 0–35)
AST: 20 U/L (ref 0–37)
Alkaline Phosphatase: 53 U/L (ref 39–117)
BUN: 11 mg/dL (ref 6–23)
CALCIUM: 9.1 mg/dL (ref 8.4–10.5)
CHLORIDE: 94 meq/L — AB (ref 96–112)
CO2: 33 meq/L — AB (ref 19–32)
CREATININE: 0.62 mg/dL (ref 0.40–1.20)
GFR: 100.95 mL/min (ref >60.00)
Glucose, Bld: 87 mg/dL (ref 70–99)
POTASSIUM: 3.7 meq/L (ref 3.5–5.1)
Sodium: 133 mEq/L — ABNORMAL LOW (ref 135–145)
Total Bilirubin: 0.5 mg/dL (ref 0.2–1.2)
Total Protein: 6.8 g/dL (ref 6.0–8.3)

## 2018-03-08 ENCOUNTER — Other Ambulatory Visit: Payer: Medicare HMO

## 2018-03-22 ENCOUNTER — Ambulatory Visit (INDEPENDENT_AMBULATORY_CARE_PROVIDER_SITE_OTHER): Payer: Medicare HMO | Admitting: Pharmacist

## 2018-03-22 ENCOUNTER — Encounter: Payer: Self-pay | Admitting: Pharmacist

## 2018-03-22 VITALS — BP 132/74 | HR 62

## 2018-03-22 DIAGNOSIS — I10 Essential (primary) hypertension: Secondary | ICD-10-CM

## 2018-03-22 MED ORDER — CARVEDILOL 3.125 MG PO TABS: 3.1250 mg | ORAL_TABLET | Freq: Two times a day (BID) | ORAL | 3 refills | Status: DC

## 2018-03-22 MED ORDER — OLMESARTAN MEDOXOMIL 40 MG PO TABS: 40.0000 mg | ORAL_TABLET | Freq: Every day | ORAL | 3 refills | Status: DC

## 2018-03-22 MED ORDER — CHLORTHALIDONE 25 MG PO TABS: 25.0000 mg | ORAL_TABLET | Freq: Every day | ORAL | 3 refills | Status: DC

## 2018-03-22 NOTE — Progress Notes (Signed)
Patient ID: Natasha Trujillo                 DOB: 09-13-1946                      MRN: [id number]     HPI: KOLINA KUBE is a 72 y.o. female referred by Dr. Tamala Julian to HTN clinic. PMH is significant for HTN and family history of premature CAD. At last visit in HTN clinic, BP was elevated at 152/78. Pt was switched from HCTZ to chlorthalidone and from Toprol to carvedilol to optimize BP lowering medications.   Pt presents today for follow up in good spirits. She reports tolerating her chlorthalidone and carvedilol well. Beta blocker therapy has helped to decrease her anxiety. Home BP readings have improved significantly from last visit. She reports home readings have been stable throughout the day 130s/70s which correlate well with today's reading in clinic. She has cut back on her coffee intake but is still drinking caffeinated coffee mostly. Pt stays active and walks multiple miles most days a week. She eats a healthy diet low in salt.   Current HTN meds: chlorthalidone 25mg  daily (AM), olmesartan 40mg  daily (PM), carvedilol 3.125mg  BID Previously tried: lisinopril - cough, some throat swelling, amlodipine - LEE, Toprol and HCTZ - switched to alternative agents in class for better efficacy BP goal: <130/36mmHg  Family History: The patient's family history includes Cancer (age of onset: 81) in her maternal aunt; Cervical cancer in her unknown relative; Coronary artery disease in her unknown relative; Dementia in her mother; Diabetes in her other; Diabetes (age of onset: 90) in her brother; Heart attack (age of onset: 109) in her son; Heart disease in her brother; Heart disease (age of onset: 28) in her father and mother; Hypertension in her brother, father, and mother; Lung cancer in her unknown relative.   Social History: Denies tobacco and illicit drug use. Occasionally drinks alcohol.  Diet: Fruit, whole wheat carbs, chicken and salmon. Avoids cold cuts. Does like goldfish andcheeze its. 4  cups of coffee each day  Exercise: Walks 4-5 miles 5 days a week  Wt Readings from Last 3 Encounters:  01/25/18 121 lb 1.9 oz (54.9 kg)  01/02/18 121 lb 6.4 oz (55.1 kg)  05/27/17 120 lb (54.4 kg)   BP Readings from Last 3 Encounters:  03/01/18 (!) 152/78  01/25/18 (!) 150/84  01/02/18 140/72   Pulse Readings from Last 3 Encounters:  03/01/18 61  01/25/18 81  01/02/18 71    Renal function: CrCl cannot be calculated (Unknown ideal weight.).  Past Medical History:  Diagnosis Date  . Hyperlipidemia   . Hypertension   . Osteopenia     Current Outpatient Medications on File Prior to Visit  Medication Sig Dispense Refill  . atorvastatin (LIPITOR) 40 MG tablet Take 1 tablet (40 mg total) by mouth daily. 90 tablet 3  . buPROPion (WELLBUTRIN XL) 150 MG 24 hr tablet Take 150 mg by mouth daily.    . Calcium 1500 MG tablet Take 1,500 mg by mouth daily.     . carvedilol (COREG) 3.125 MG tablet Take 1 tablet (3.125 mg total) by mouth 2 (two) times daily. 60 tablet 11  . chlorthalidone (HYGROTON) 25 MG tablet Take 1 tablet (25 mg total) by mouth daily. 30 tablet 11  . Cholecalciferol (VITAMIN D-3 PO) Take 800 Units by mouth daily.    . Multiple Vitamins-Minerals (MULTIVITAMIN PO) Take 1 tablet by mouth daily.    Marland Kitchen  olmesartan (BENICAR) 40 MG tablet Take 40 mg by mouth daily.    . Omega-3 Fatty Acids (FISH OIL) 1000 MG CAPS Take 1 capsule by mouth daily.      . vitamin E 400 UNIT capsule Take 400 Units by mouth daily.      No current facility-administered medications on file prior to visit.     Allergies  Allergen Reactions  . Lisinopril Other (See Comments)    Throat swelling  . Adrenalin [Epinephrine] Other (See Comments)    dizziness     Assessment/Plan:  1. Hypertension - BP has improved significantly since last visit and is now very close to goal <130/73mmHg. Will continue carvedilol 3.125mg  BID (dose titrated prevented due to HR in low 60s), olmesartan 40mg  daily, and  chlorthalidone 25mg  daily. Encouraged pt to reduce intake of caffeinated coffee to bring BP readings consistently < 130/60mmHg at home. She will continue to monitor her BP and call clinic if her systolic BP increases to > 140 consistently. F/u in HTN clinic as needed.   Ranisha Allaire E. Ghalia Reicks, PharmD, CPP, [place] [address] [place], [place] [address] Phone: [phone]; Fax: [phone] 03/22/2018 9:08 AM

## 2018-03-22 NOTE — Patient Instructions (Signed)
It was nice to see you today  Continue taking your current medications  Continue to cut back on caffeinated coffee  Your blood pressure goal is < 130/48mmHg  Call Dariana Garbett in the blood pressure clinic if your systolic readings start to increase > 140. [phone]

## 2018-04-07 ENCOUNTER — Telehealth: Payer: Self-pay | Admitting: *Deleted

## 2018-04-07 NOTE — Telephone Encounter (Signed)
Received Medical records from [facility]; forwarded to provider/SLS 05/31

## 2018-04-13 ENCOUNTER — Encounter: Payer: Self-pay | Admitting: Family Medicine

## 2018-05-15 ENCOUNTER — Other Ambulatory Visit: Payer: Self-pay | Admitting: Family Medicine

## 2018-05-25 DIAGNOSIS — L820 Inflamed seborrheic keratosis: Secondary | ICD-10-CM | POA: Diagnosis not present

## 2018-05-25 DIAGNOSIS — L821 Other seborrheic keratosis: Secondary | ICD-10-CM | POA: Diagnosis not present

## 2018-05-25 DIAGNOSIS — D485 Neoplasm of uncertain behavior of skin: Secondary | ICD-10-CM | POA: Diagnosis not present

## 2018-05-25 DIAGNOSIS — D1801 Hemangioma of skin and subcutaneous tissue: Secondary | ICD-10-CM | POA: Diagnosis not present

## 2018-05-30 ENCOUNTER — Ambulatory Visit: Payer: Medicare HMO | Admitting: *Deleted

## 2018-06-14 NOTE — Progress Notes (Signed)
Subjective:   Natasha Trujillo is a 72 y.o. female who presents for Medicare Annual (Subsequent) preventive examination.  Review of Systems: No ROS.  Medicare Wellness Visit. Additional risk factors are reflected in the social history. Cardiac Risk Factors include: advanced age (>75men, >76 women);dyslipidemia;hypertension   Sleep patterns: Sleeps well 9-10 hrs.  Home Safety/Smoke Alarms: Feels safe in home. Smoke alarms in place.  Living environment; residence and Firearm Safety: Lives with husband in 2 story home. No trouble with stairs.   Female:   Pap- 2018. Dr.Grewal. normal      Mammo- last 06/23/17      Dexa scan-  Pt states she will do this Oct. With DR.Grewal.      CCS-due 2024     Objective:     Vitals: BP 140/68 (BP Location: Left Arm, Patient Position: Sitting, Cuff Size: Normal)   Pulse 64   Ht 5\' 2"  (1.575 m)   Wt 120 lb (54.4 kg)   SpO2 98%   BMI 21.95 kg/m   Body mass index is 21.95 kg/m.  Advanced Directives 06/16/2018 05/27/2017 01/25/2016 10/28/2015  Does Patient Have a Medical Advance Directive? No Yes No No  Type of Advance Directive - Natasha Trujillo - -  Does patient want to make changes to medical advance directive? - - - No - Patient declined  Copy of Natasha Trujillo in Chart? - Yes - No - copy requested  Would patient like information on creating a medical advance directive? Yes (MAU/Ambulatory/Procedural Areas - Information given) - No - patient declined information No - patient declined information    Tobacco Social History   Tobacco Use  Smoking Status Never Smoker  Smokeless Tobacco Never Used     Counseling given: Not Answered   Clinical Intake: Pain : No/denies pain    Past Medical History:  Diagnosis Date  . Hyperlipidemia   . Hypertension   . Osteopenia    Past Surgical History:  Procedure Laterality Date  . BLEPHAROPLASTY Bilateral 08/2016   Dr Shelbie Proctor  . KNEE SURGERY Left 09/12/13  .  SHOULDER SURGERY Right 01/05/2017   Dr.Caffrey  . TUBAL LIGATION     Family History  Problem Relation Age of Onset  . Dementia Mother   . Hypertension Mother   . Heart disease Mother 54       CABG  . Hypertension Father   . Heart disease Father 32       MI  . Diabetes Brother 16       type 1  . Hypertension Brother   . Heart disease Brother        cabg  . Cancer Maternal Aunt 68       cervical  . Diabetes Other        committed suicide  . Coronary artery disease Unknown        female 1st degree relative <50/female 1st degree relative <60  . Cervical cancer Unknown   . Lung cancer Unknown   . Heart attack Son 32  . Colon cancer Neg Hx    Social History   Socioeconomic History  . Marital status: Married    Spouse name: Not on file  . Number of children: Not on file  . Years of education: Not on file  . Highest education level: Not on file  Occupational History  . Occupation: retired    Fish farm manager: RETIRED  Social Needs  . Financial resource strain: Not on file  .  Food insecurity:    Worry: Not on file    Inability: Not on file  . Transportation needs:    Medical: Not on file    Non-medical: Not on file  Tobacco Use  . Smoking status: Never Smoker  . Smokeless tobacco: Never Used  Substance and Sexual Activity  . Alcohol use: Yes    Comment: occasional  . Drug use: No  . Sexual activity: Yes    Partners: Male  Lifestyle  . Physical activity:    Days per week: Not on file    Minutes per session: Not on file  . Stress: Not on file  Relationships  . Social connections:    Talks on phone: Not on file    Gets together: Not on file    Attends religious service: Not on file    Active member of club or organization: Not on file    Attends meetings of clubs or organizations: Not on file    Relationship status: Not on file  Other Topics Concern  . Not on file  Social History Narrative   Exercise--walk 4 miles 5 days a week    Outpatient Encounter Medications  as of 06/16/2018  Medication Sig  . atorvastatin (LIPITOR) 40 MG tablet Take 1 tablet (40 mg total) by mouth daily.  Marland Kitchen buPROPion (WELLBUTRIN XL) 150 MG 24 hr tablet TAKE 1 TABLET EVERY MORNING  . Calcium 1500 MG tablet Take 1,500 mg by mouth daily.   . carvedilol (COREG) 3.125 MG tablet Take 1 tablet (3.125 mg total) by mouth 2 (two) times daily.  . chlorthalidone (HYGROTON) 25 MG tablet Take 1 tablet (25 mg total) by mouth daily.  . Cholecalciferol (VITAMIN D-3 PO) Take 800 Units by mouth daily.  . Multiple Vitamins-Minerals (MULTIVITAMIN PO) Take 1 tablet by mouth daily.  Marland Kitchen olmesartan (BENICAR) 40 MG tablet Take 1 tablet (40 mg total) by mouth daily.  . Omega-3 Fatty Acids (FISH OIL) 1000 MG CAPS Take 1 capsule by mouth daily.    . vitamin E 400 UNIT capsule Take 400 Units by mouth daily.    No facility-administered encounter medications on file as of 06/16/2018.     Activities of Daily Living In your present state of health, do you have any difficulty performing the following activities: 06/16/2018  Hearing? N  Vision? N  Difficulty concentrating or making decisions? N  Walking or climbing stairs? N  Dressing or bathing? N  Doing errands, shopping? N  Preparing Food and eating ? N  Using the Toilet? N  In the past six months, have you accidently leaked urine? N  Do you have problems with loss of bowel control? N  Managing your Medications? N  Managing your Finances? N  Housekeeping or managing your Housekeeping? N  Some recent data might be hidden    Patient Care Team: Carollee Herter, Alferd Apa, DO as PCP - General Belva Crome, MD as PCP - Cardiology (Cardiology) Dian Queen, MD as Consulting Physician (Obstetrics and Gynecology) Memory Argue, MD as Referring Physician Mincey, Neena Rhymes, MD as Consulting Physician (Ophthalmology)    Assessment:   This is a routine wellness examination for Natasha Trujillo. Physical assessment deferred to PCP.  Exercise Activities and Dietary  recommendations  Diet (meal preparation, eat out, water intake, caffeinated beverages, dairy products, fruits and vegetables): in general, a "healthy" diet  , well balanced    Goals    . Drink 3 bottles of water per day (pt-stated)    . Walk  60 min 5x/week       Fall Risk Fall Risk  06/16/2018 01/02/2018 05/27/2017 12/03/2016 04/27/2016  Falls in the past year? No No Yes No No  Number falls in past yr: - - 1 - -  Injury with Fall? - - Yes - -  Follow up - - Education provided;Falls prevention discussed - -    Depression Screen PHQ 2/9 Scores 06/16/2018 01/02/2018 05/27/2017 12/03/2016  PHQ - 2 Score 0 0 0 0  Exception Documentation - - - -     Cognitive Function Ad8 score reviewed for issues:  Issues making decisions:no  Less interest in hobbies / activities:no  Repeats questions, stories (family complaining):no  Trouble using ordinary gadgets (microwave, computer, phone):no  Forgets the month or year: no  Mismanaging finances: no  Remembering appts:no  Daily problems with thinking and/or memory:no Ad8 score is=0          Immunization History  Administered Date(s) Administered  . Influenza Whole 07/26/2008, 08/14/2008, 09/22/2009, 07/24/2010  . Influenza, High Dose Seasonal PF 09/09/2015  . Influenza,inj,Quad PF,6+ Mos 08/15/2013, 08/30/2014  . Pneumococcal Conjugate-13 06/20/2014  . Pneumococcal Polysaccharide-23 05/10/2013  . Td 09/22/2005, 07/26/2008  . Zoster 06/04/2008  . Zoster Recombinat (Shingrix) 03/09/2017    Screening Tests Health Maintenance  Topic Date Due  . INFLUENZA VACCINE  06/08/2018  . MAMMOGRAM  06/23/2018  . TETANUS/TDAP  07/26/2018  . COLONOSCOPY  08/21/2018  . DEXA SCAN  Completed  . Hepatitis C Screening  Completed  . PNA vac Low Risk Adult  Completed      Plan:    Please schedule your next medicare wellness visit with me in 1 yr.  Continue to eat heart healthy diet (full of fruits, vegetables, whole grains, lean protein,  water--limit salt, fat, and sugar intake) and increase physical activity as tolerated.  Continue doing brain stimulating activities (puzzles, reading, adult coloring books, staying active) to keep memory sharp.     I have personally reviewed and noted the following in the patient's chart:   . Medical and social history . Use of alcohol, tobacco or illicit drugs  . Current medications and supplements . Functional ability and status . Nutritional status . Physical activity . Advanced directives . List of other physicians . Hospitalizations, surgeries, and ER visits in previous 12 months . Vitals . Screenings to include cognitive, depression, and falls . Referrals and appointments  In addition, I have reviewed and discussed with patient certain preventive protocols, quality metrics, and best practice recommendations. A written personalized care plan for preventive services as well as general preventive health recommendations were provided to patient.     Shela Nevin, [place]  06/16/2018

## 2018-06-16 ENCOUNTER — Encounter: Payer: Self-pay | Admitting: *Deleted

## 2018-06-16 ENCOUNTER — Ambulatory Visit (INDEPENDENT_AMBULATORY_CARE_PROVIDER_SITE_OTHER): Payer: Medicare HMO | Admitting: *Deleted

## 2018-06-16 VITALS — BP 140/68 | HR 64 | Ht 62.0 in | Wt 120.0 lb

## 2018-06-16 DIAGNOSIS — H9192 Unspecified hearing loss, left ear: Secondary | ICD-10-CM

## 2018-06-16 DIAGNOSIS — Z0000 Encounter for general adult medical examination without abnormal findings: Secondary | ICD-10-CM | POA: Diagnosis not present

## 2018-06-16 NOTE — Addendum Note (Signed)
Addended by: Naaman Plummer A on: 06/16/2018 11:54 AM   Modules accepted: Orders

## 2018-06-16 NOTE — Progress Notes (Signed)
Noted. Agree with above.  [place], DO 06/16/18 12:00 PM

## 2018-06-16 NOTE — Patient Instructions (Signed)
Please schedule your next medicare wellness visit with me in 1 yr.  Continue to eat heart healthy diet (full of fruits, vegetables, whole grains, lean protein, water--limit salt, fat, and sugar intake) and increase physical activity as tolerated.  Continue doing brain stimulating activities (puzzles, reading, adult coloring books, staying active) to keep memory sharp.    Natasha Trujillo , Thank you for taking time to come for your Medicare Wellness Visit. I appreciate your ongoing commitment to your health goals. Please review the following plan we discussed and let me know if I can assist you in the future.   These are the goals we discussed: Goals    . Drink 3 bottles of water per day (pt-stated)    . Walk 60 min 5x/week       This is a list of the screening recommended for you and due dates:  Health Maintenance  Topic Date Due  . Flu Shot  06/08/2018  . Mammogram  06/23/2018  . Tetanus Vaccine  07/26/2018  . Colon Cancer Screening  08/21/2018  . DEXA scan (bone density measurement)  Completed  .  Hepatitis C: One time screening is recommended by Center for Disease Control  (CDC) for  adults born from [birth year] through 1964.   Completed  . Pneumonia vaccines  Completed    Health Maintenance for Postmenopausal Women Menopause is a normal process in which your reproductive ability comes to an end. This process happens gradually over a span of months to years, usually between the ages of 76 and 38. Menopause is complete when you have missed 12 consecutive menstrual periods. It is important to talk with your health care provider about some of the most common conditions that affect postmenopausal women, such as heart disease, cancer, and bone loss (osteoporosis). Adopting a healthy lifestyle and getting preventive care can help to promote your health and wellness. Those actions can also lower your chances of developing some of these common conditions. What should I know about  menopause? During menopause, you may experience a number of symptoms, such as:  Moderate-to-severe hot flashes.  Night sweats.  Decrease in sex drive.  Mood swings.  Headaches.  Tiredness.  Irritability.  Memory problems.  Insomnia.  Choosing to treat or not to treat menopausal changes is an individual decision that you make with your health care provider. What should I know about hormone replacement therapy and supplements? Hormone therapy products are effective for treating symptoms that are associated with menopause, such as hot flashes and night sweats. Hormone replacement carries certain risks, especially as you become older. If you are thinking about using estrogen or estrogen with progestin treatments, discuss the benefits and risks with your health care provider. What should I know about heart disease and stroke? Heart disease, heart attack, and stroke become more likely as you age. This may be due, in part, to the hormonal changes that your body experiences during menopause. These can affect how your body processes dietary fats, triglycerides, and cholesterol. Heart attack and stroke are both medical emergencies. There are many things that you can do to help prevent heart disease and stroke:  Have your blood pressure checked at least every 1-2 years. High blood pressure causes heart disease and increases the risk of stroke.  If you are 72-50 years old, ask your health care provider if you should take aspirin to prevent a heart attack or a stroke.  Do not use any tobacco products, including cigarettes, chewing tobacco, or electronic cigarettes. If you  need help quitting, ask your health care provider.  It is important to eat a healthy diet and maintain a healthy weight. ? Be sure to include plenty of vegetables, fruits, low-fat dairy products, and lean protein. ? Avoid eating foods that are high in solid fats, added sugars, or salt (sodium).  Get regular exercise. This  is one of the most important things that you can do for your health. ? Try to exercise for at least 150 minutes each week. The type of exercise that you do should increase your heart rate and make you sweat. This is known as moderate-intensity exercise. ? Try to do strengthening exercises at least twice each week. Do these in addition to the moderate-intensity exercise.  Know your numbers.Ask your health care provider to check your cholesterol and your blood glucose. Continue to have your blood tested as directed by your health care provider.  What should I know about cancer screening? There are several types of cancer. Take the following steps to reduce your risk and to catch any cancer development as early as possible. Breast Cancer  Practice breast self-awareness. ? This means understanding how your breasts normally appear and feel. ? It also means doing regular breast self-exams. Let your health care provider know about any changes, no matter how small.  If you are 71 or older, have a clinician do a breast exam (clinical breast exam or CBE) every year. Depending on your age, family history, and medical history, it may be recommended that you also have a yearly breast X-ray (mammogram).  If you have a family history of breast cancer, talk with your health care provider about genetic screening.  If you are at high risk for breast cancer, talk with your health care provider about having an MRI and a mammogram every year.  Breast cancer (BRCA) gene test is recommended for women who have family members with BRCA-related cancers. Results of the assessment will determine the need for genetic counseling and BRCA1 and for BRCA2 testing. BRCA-related cancers include these types: ? Breast. This occurs in males or females. ? Ovarian. ? Tubal. This may also be called fallopian tube cancer. ? Cancer of the abdominal or pelvic lining (peritoneal cancer). ? Prostate. ? Pancreatic.  Cervical,  Uterine, and Ovarian Cancer Your health care provider may recommend that you be screened regularly for cancer of the pelvic organs. These include your ovaries, uterus, and vagina. This screening involves a pelvic exam, which includes checking for microscopic changes to the surface of your cervix (Pap test).  For women ages 21-65, health care providers may recommend a pelvic exam and a Pap test every three years. For women ages 77-65, they may recommend the Pap test and pelvic exam, combined with testing for human papilloma virus (HPV), every five years. Some types of HPV increase your risk of cervical cancer. Testing for HPV may also be done on women of any age who have unclear Pap test results.  Other health care providers may not recommend any screening for nonpregnant women who are considered low risk for pelvic cancer and have no symptoms. Ask your health care provider if a screening pelvic exam is right for you.  If you have had past treatment for cervical cancer or a condition that could lead to cancer, you need Pap tests and screening for cancer for at least 20 years after your treatment. If Pap tests have been discontinued for you, your risk factors (such as having a new sexual partner) need to be  reassessed to determine if you should start having screenings again. Some women have medical problems that increase the chance of getting cervical cancer. In these cases, your health care provider may recommend that you have screening and Pap tests more often.  If you have a family history of uterine cancer or ovarian cancer, talk with your health care provider about genetic screening.  If you have vaginal bleeding after reaching menopause, tell your health care provider.  There are currently no reliable tests available to screen for ovarian cancer.  Lung Cancer Lung cancer screening is recommended for adults 78-39 years old who are at high risk for lung cancer because of a history of smoking. A  yearly low-dose CT scan of the lungs is recommended if you:  Currently smoke.  Have a history of at least 30 pack-years of smoking and you currently smoke or have quit within the past 15 years. A pack-year is smoking an average of one pack of cigarettes per day for one year.  Yearly screening should:  Continue until it has been 15 years since you quit.  Stop if you develop a health problem that would prevent you from having lung cancer treatment.  Colorectal Cancer  This type of cancer can be detected and can often be prevented.  Routine colorectal cancer screening usually begins at age 23 and continues through age 66.  If you have risk factors for colon cancer, your health care provider may recommend that you be screened at an earlier age.  If you have a family history of colorectal cancer, talk with your health care provider about genetic screening.  Your health care provider may also recommend using home test kits to check for hidden blood in your stool.  A small camera at the end of a tube can be used to examine your colon directly (sigmoidoscopy or colonoscopy). This is done to check for the earliest forms of colorectal cancer.  Direct examination of the colon should be repeated every 5-10 years until age 47. However, if early forms of precancerous polyps or small growths are found or if you have a family history or genetic risk for colorectal cancer, you may need to be screened more often.  Skin Cancer  Check your skin from head to toe regularly.  Monitor any moles. Be sure to tell your health care provider: ? About any new moles or changes in moles, especially if there is a change in a mole's shape or color. ? If you have a mole that is larger than the size of a pencil eraser.  If any of your family members has a history of skin cancer, especially at a young age, talk with your health care provider about genetic screening.  Always use sunscreen. Apply sunscreen liberally  and repeatedly throughout the day.  Whenever you are outside, protect yourself by wearing long sleeves, pants, a wide-brimmed hat, and sunglasses.  What should I know about osteoporosis? Osteoporosis is a condition in which bone destruction happens more quickly than new bone creation. After menopause, you may be at an increased risk for osteoporosis. To help prevent osteoporosis or the bone fractures that can happen because of osteoporosis, the following is recommended:  If you are 61-21 years old, get at least 1,000 mg of calcium and at least 600 mg of vitamin D per day.  If you are older than age 12 but younger than age 66, get at least 1,200 mg of calcium and at least 600 mg of vitamin D per  day.  If you are older than age 61, get at least 1,200 mg of calcium and at least 800 mg of vitamin D per day.  Smoking and excessive alcohol intake increase the risk of osteoporosis. Eat foods that are rich in calcium and vitamin D, and do weight-bearing exercises several times each week as directed by your health care provider. What should I know about how menopause affects my mental health? Depression may occur at any age, but it is more common as you become older. Common symptoms of depression include:  Low or sad mood.  Changes in sleep patterns.  Changes in appetite or eating patterns.  Feeling an overall lack of motivation or enjoyment of activities that you previously enjoyed.  Frequent crying spells.  Talk with your health care provider if you think that you are experiencing depression. What should I know about immunizations? It is important that you get and maintain your immunizations. These include:  Tetanus, diphtheria, and pertussis (Tdap) booster vaccine.  Influenza every year before the flu season begins.  Pneumonia vaccine.  Shingles vaccine.  Your health care provider may also recommend other immunizations. This information is not intended to replace advice given to you  by your health care provider. Make sure you discuss any questions you have with your health care provider. Document Released: 12/17/2005 Document Revised: 05/14/2016 Document Reviewed: 07/29/2015 Elsevier Interactive Patient Education  2018 [employer].

## 2018-08-08 ENCOUNTER — Encounter: Payer: Self-pay | Admitting: Interventional Cardiology

## 2018-08-08 ENCOUNTER — Ambulatory Visit: Payer: Medicare HMO | Admitting: Interventional Cardiology

## 2018-08-08 VITALS — BP 146/92 | HR 79 | Ht 62.0 in | Wt 120.4 lb

## 2018-08-08 DIAGNOSIS — Z8249 Family history of ischemic heart disease and other diseases of the circulatory system: Secondary | ICD-10-CM

## 2018-08-08 DIAGNOSIS — I10 Essential (primary) hypertension: Secondary | ICD-10-CM | POA: Diagnosis not present

## 2018-08-08 DIAGNOSIS — E785 Hyperlipidemia, unspecified: Secondary | ICD-10-CM

## 2018-08-08 NOTE — Patient Instructions (Signed)

## 2018-08-08 NOTE — Progress Notes (Signed)
Cardiology Office Note:    Date:  08/08/2018   ID:  Natasha Trujillo, DOB August 29, 1946, MRN [id number]  PCP:  Carollee Herter, Alferd Apa, DO  Cardiologist:  Sinclair Grooms, MD   Referring MD: Carollee Herter, Alferd Apa, *   Chief Complaint  Patient presents with  . Hypertension  . Advice Only    History of Present Illness:    Natasha Trujillo is a 72 y.o. female with a hx of hypertension control and primary prevention given family history of premature CAD.  Natasha Trujillo is doing well.  She gets an approximately 300 minutes of aerobic activity per week.  She is asymptomatic with physical activity.  Denies dyspnea.  She has not had chest pain.  She did have a peculiar episode of bilateral ear and jaw discomfort that occurred spontaneously one morning after her usual 1 Hour Walk.  There was no dyspnea, diaphoresis, weakness, or palpitations.  There were no neurological complaints.  It resolved spontaneously.  No medication side effects.  Past Medical History:  Diagnosis Date  . Hyperlipidemia   . Hypertension   . Osteopenia     Past Surgical History:  Procedure Laterality Date  . BLEPHAROPLASTY Bilateral 08/2016   Dr Shelbie Proctor  . KNEE SURGERY Left 09/12/13  . SHOULDER SURGERY Right 01/05/2017   Dr.Caffrey  . TUBAL LIGATION      Current Medications: Current Meds  Medication Sig  . atorvastatin (LIPITOR) 40 MG tablet Take 1 tablet (40 mg total) by mouth daily.  Marland Kitchen buPROPion (WELLBUTRIN XL) 150 MG 24 hr tablet TAKE 1 TABLET EVERY MORNING  . Calcium 1500 MG tablet Take 1,500 mg by mouth daily.   . carvedilol (COREG) 3.125 MG tablet Take 3.125 mg by mouth 2 (two) times daily with a meal.  . chlorthalidone (HYGROTON) 25 MG tablet Take 25 mg by mouth daily.  . Cholecalciferol (VITAMIN D-3 PO) Take 800 Units by mouth daily.  . Multiple Vitamins-Minerals (MULTIVITAMIN PO) Take 1 tablet by mouth daily.  Marland Kitchen olmesartan (BENICAR) 40 MG tablet Take 1 tablet (40 mg total) by mouth daily.  .  Omega-3 Fatty Acids (FISH OIL) 1000 MG CAPS Take 1 capsule by mouth daily.    . vitamin E 400 UNIT capsule Take 400 Units by mouth daily.      Allergies:   Lisinopril and Adrenalin [epinephrine]   Social History   Socioeconomic History  . Marital status: Married    Spouse name: Not on file  . Number of children: Not on file  . Years of education: Not on file  . Highest education level: Not on file  Occupational History  . Occupation: retired    Fish farm manager: RETIRED  Social Needs  . Financial resource strain: Not on file  . Food insecurity:    Worry: Not on file    Inability: Not on file  . Transportation needs:    Medical: Not on file    Non-medical: Not on file  Tobacco Use  . Smoking status: Never Smoker  . Smokeless tobacco: Never Used  Substance and Sexual Activity  . Alcohol use: Yes    Comment: occasional  . Drug use: No  . Sexual activity: Yes    Partners: Male  Lifestyle  . Physical activity:    Days per week: Not on file    Minutes per session: Not on file  . Stress: Not on file  Relationships  . Social connections:    Talks on phone: Not on file  Gets together: Not on file    Attends religious service: Not on file    Active member of club or organization: Not on file    Attends meetings of clubs or organizations: Not on file    Relationship status: Not on file  Other Topics Concern  . Not on file  Social History Narrative   Exercise--walk 4 miles 5 days a week     Family History: The patient's family history includes Cancer (age of onset: 19) in her maternal aunt; Cervical cancer in her unknown relative; Coronary artery disease in her unknown relative; Dementia in her mother; Diabetes in her other; Diabetes (age of onset: 43) in her brother; Heart attack (age of onset: 77) in her son; Heart disease in her brother; Heart disease (age of onset: 89) in her father and mother; Hypertension in her brother, father, and mother; Lung cancer in her unknown  relative. There is no history of Colon cancer.  ROS:   Please see the history of present illness.    None All other systems reviewed and are negative.  EKGs/Labs/Other Studies Reviewed:    The following studies were reviewed today: none  EKG:  EKG is not ordered today.    Recent Labs: 01/02/2018: Hemoglobin 12.3; Platelets 279.0 03/06/2018: ALT 18; BUN 11; Creatinine, Ser 0.62; Potassium 3.7; Sodium 133  Recent Lipid Panel    Component Value Date/Time   CHOL 139 03/06/2018 0859   TRIG 42.0 03/06/2018 0859   HDL 72.20 03/06/2018 0859   CHOLHDL 2 03/06/2018 0859   VLDL 8.4 03/06/2018 0859   LDLCALC 59 03/06/2018 0859    Physical Exam:    VS:  BP (!) 146/92   Pulse 79   Ht 5\' 2"  (1.575 m)   Wt 120 lb 6.4 oz (54.6 kg)   BMI 22.02 kg/m     Wt Readings from Last 3 Encounters:  08/08/18 120 lb 6.4 oz (54.6 kg)  06/16/18 120 lb (54.4 kg)  01/25/18 121 lb 1.9 oz (54.9 kg)     GEN: Fusion of the stated age well nourished, well developed in no acute distress HEENT: Normal NECK: No JVD. LYMPHATICS: No lymphadenopathy CARDIAC: RRR, no murmur, no gallop, no edema. VASCULAR: 2+ symmetric radial and posterior tibial bilateral pulses.  No bruits. RESPIRATORY:  Clear to auscultation without rales, wheezing or rhonchi  ABDOMEN: Soft, non-tender, non-distended, No pulsatile mass, MUSCULOSKELETAL: No deformity  SKIN: Warm and dry NEUROLOGIC:  Alert and oriented x 3 PSYCHIATRIC:  Normal affect   ASSESSMENT:    1. Essential hypertension   2. Hyperlipidemia LDL goal <100   3. Family history of early CAD    PLAN:    In order of problems listed above:  1. Systolic blood pressure is mildly elevated.  She will track her pressures at home.  Target blood pressure should be 130/80 mmHg.  Mildly elevated today.  Asked her to record pre-exercise blood pressures at home and inform us of results with the notion that we are concerned if it is consistently higher than the target.  Perhaps  there is a component of whitecoat phenomenon today. 2. LDL is in a safe place less than 70 on statin therapy.  Continue aggressive primary prevention.  Clinical follow-up in 1 year.   Medication Adjustments/Labs and Tests Ordered: Current medicines are reviewed at length with the patient today.  Concerns regarding medicines are outlined above.  No orders of the defined types were placed in this encounter.  No orders of the defined  types were placed in this encounter.   Patient Instructions  Medication Instructions:  Your physician recommends that you continue on your current medications as directed. Please refer to the Current Medication list given to you today.   Labwork: None  Testing/Procedures: None  Follow-Up: Your physician wants you to follow-up in: 1 year with Dr. Tamala Julian.  You will receive a reminder letter in the mail two months in advance. If you don't receive a letter, please call our office to schedule the follow-up appointment.   Any Other Special Instructions Will Be Listed Below (If Applicable).     If you need a refill on your cardiac medications before your next appointment, please call your pharmacy.      Signed, Sinclair Grooms, MD  08/08/2018 1:43 PM    [facility]

## 2018-08-16 DIAGNOSIS — Z23 Encounter for immunization: Secondary | ICD-10-CM | POA: Diagnosis not present

## 2018-08-21 IMAGING — MR MR SHOULDER*R* W/O CM
4 of 5 series · 27 of 40 positions shown · non-contrast
Comparison: None.

CLINICAL DATA: Right shoulder pain and weakness for 1 year.

EXAM:
MRI OF THE RIGHT SHOULDER WITHOUT CONTRAST
TECHNIQUE: Multiplanar, multisequence MR imaging of the shoulder was performed.
No intravenous contrast was administered.

[Series 3: T2 fat-sat · axial · 4.0mm · 0.55mm/px · z∈[+23,+113]mm · 8 of 20 slices shown (1 of 3)]
[im 1/20]
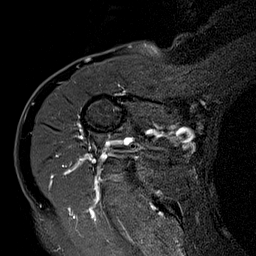
[im 3/20]
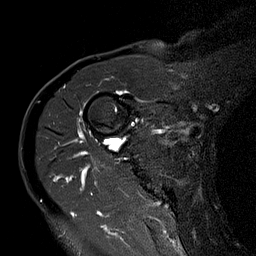
[im 6/20]
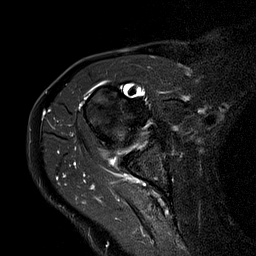
[im 9/20]
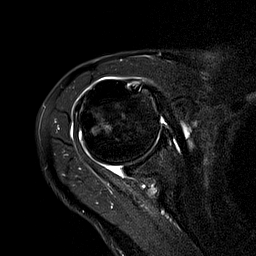
[im 11/20]
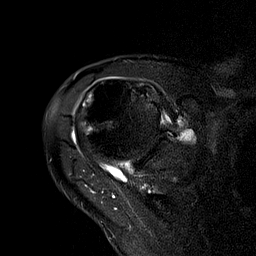
[im 14/20]
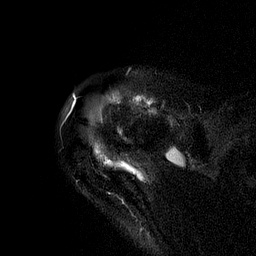
[im 17/20]
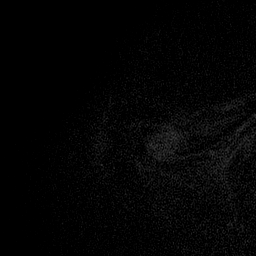
[im 20/20]
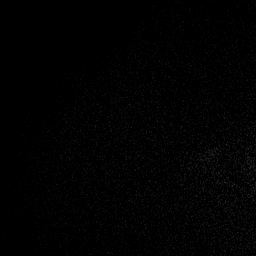

[Series 4: T2 fat-sat · oblique · 4.0mm · 0.55mm/px · 9 of 20 slices shown (2 of 3)]
[im 1/20]
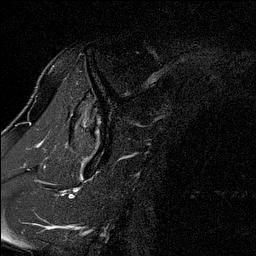
[im 3/20]
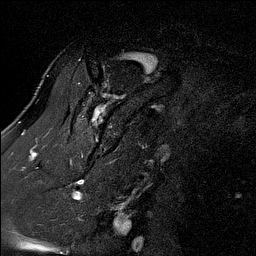
[im 5/20]
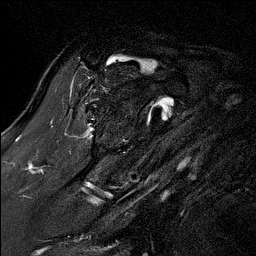
[im 8/20]
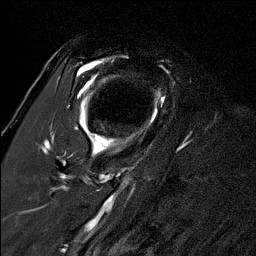
[im 10/20]
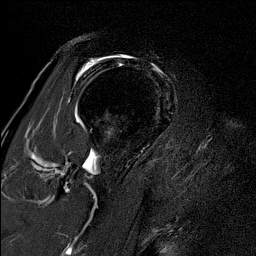
[im 12/20]
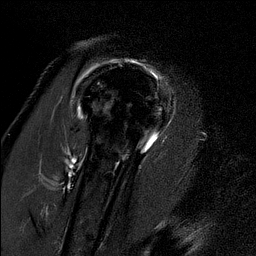
[im 15/20]
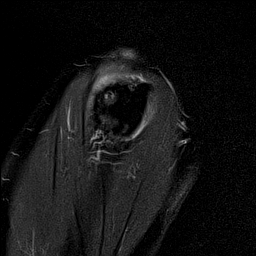
[im 17/20]
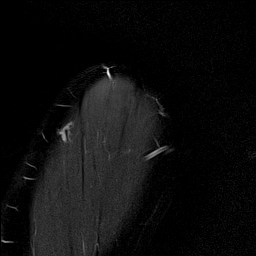
[im 20/20]
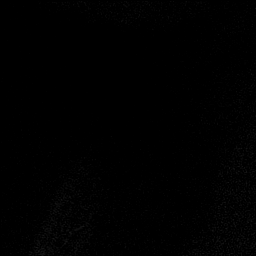

[Series 6: T2 fat-sat · oblique · 4.0mm · 0.27mm/px · 3 of 17 slices shown (3 of 3)]
[im 3/17]
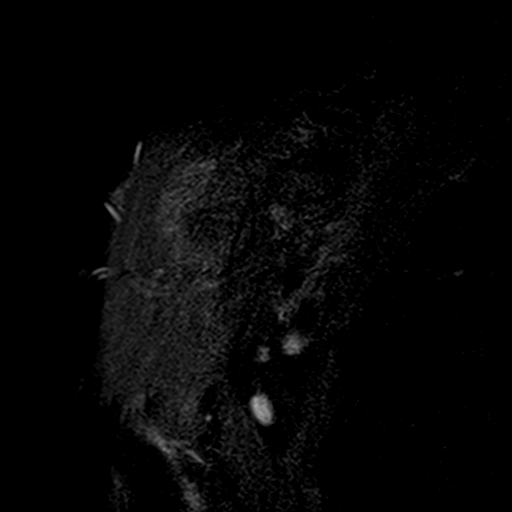
[im 9/17]
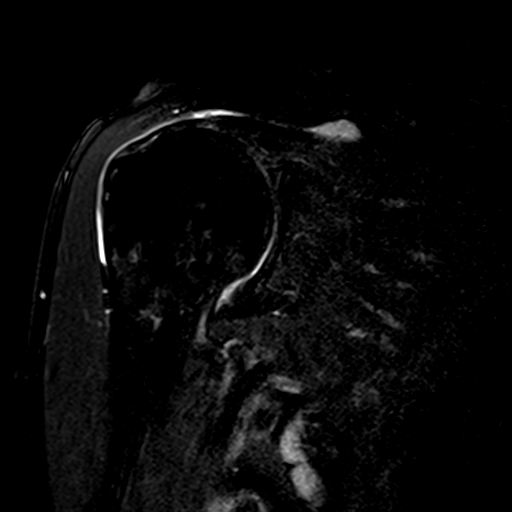
[im 14/17]
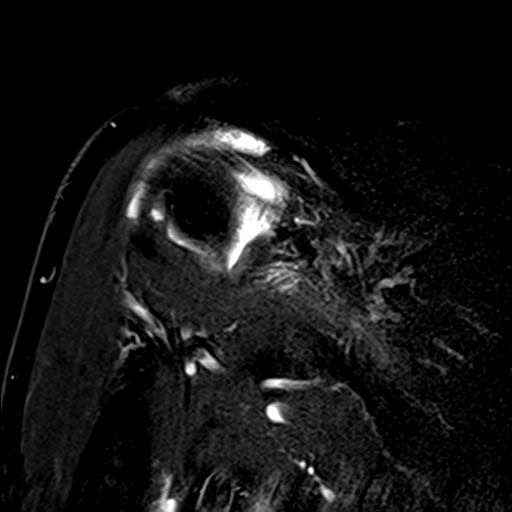

[Series 7: PD · oblique · 4.0mm · 0.22mm/px · 7 of 17 slices shown]
[im 1/17]
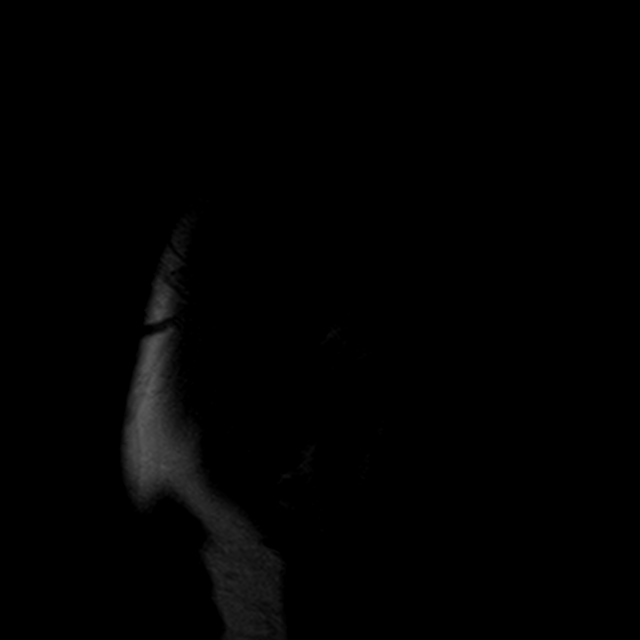
[im 3/17]
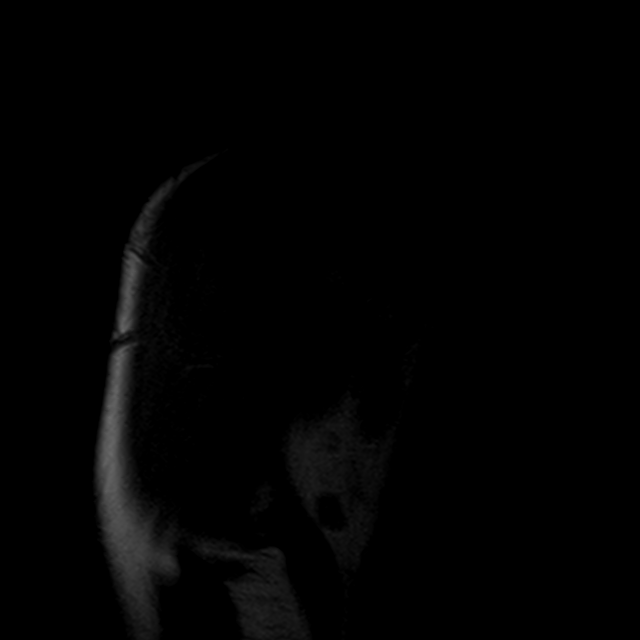
[im 6/17]
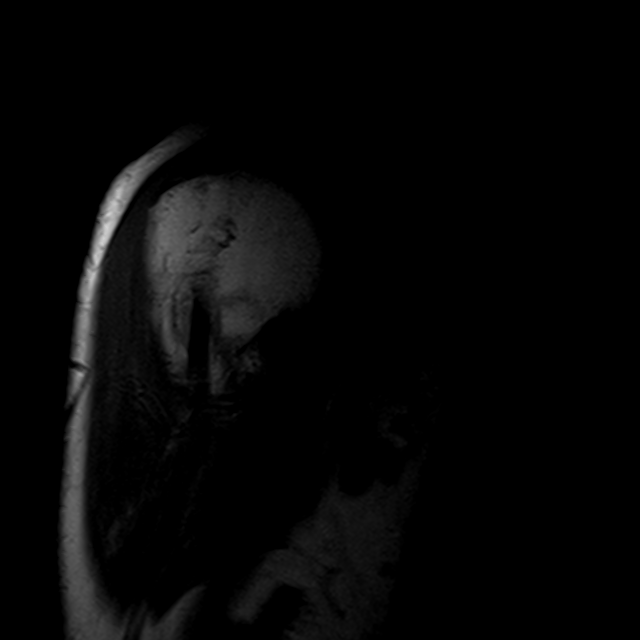
[im 9/17]
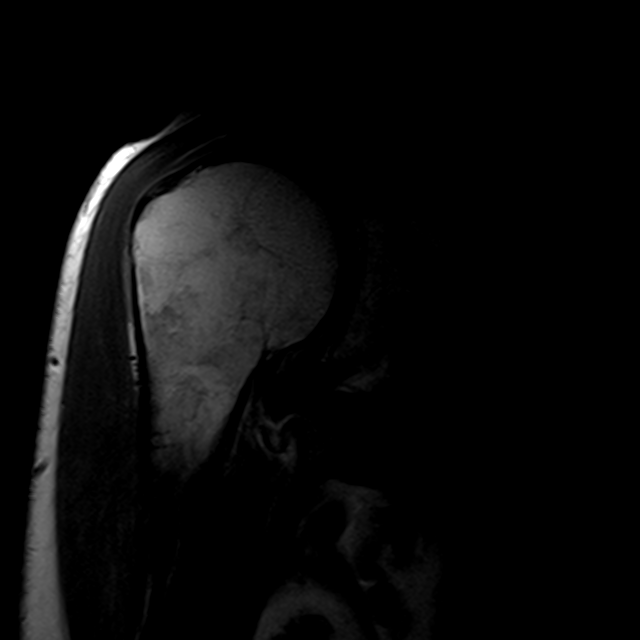
[im 11/17]
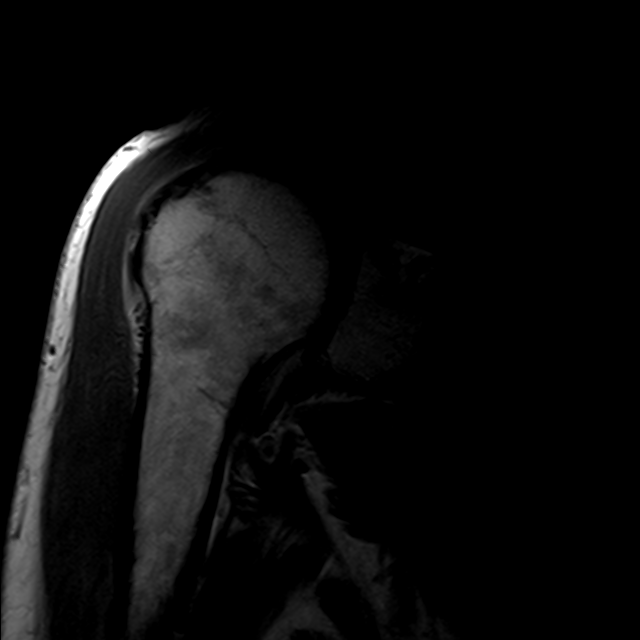
[im 14/17]
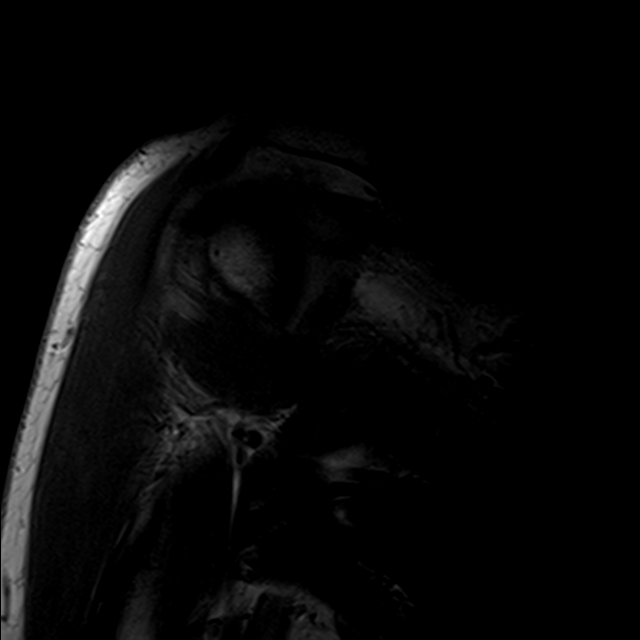
[im 17/17]
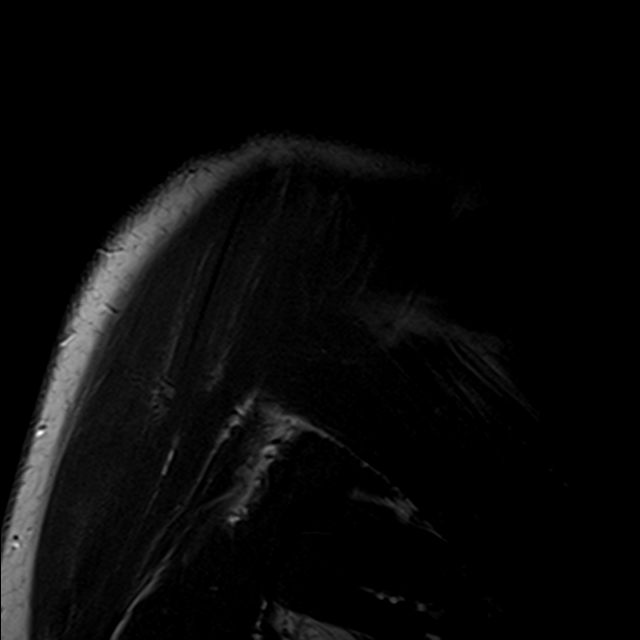

[27 of 40 positions shown; findings below may reference images not displayed]

FINDINGS: Rotator cuff: Severe thinning of the supraspinatus tendon with a
full-thickness tear of the anterior supraspinatus tendon measuring
approximately 12 mm in anterior- posterior dimension. Moderate
tendinosis of the infraspinatus tendon. Teres minor tendon is
intact. Subscapularis tendon is intact.

Muscles: No atrophy or fatty replacement of nor abnormal signal
within, the muscles of the rotator cuff.

Biceps long head: Moderate tendinosis of the intraarticular portion
of the long head of the biceps tendon with a small interstitial
tear.

Acromioclavicular Joint: Mild arthropathy of the acromioclavicular
joint. Type II acromion. Small amount of subacromial/subdeltoid
bursal fluid.

Glenohumeral Joint: No joint effusion.  No chondral defect.

Labrum: Grossly intact, but evaluation is limited by lack of
intraarticular fluid.

Bones:  No marrow abnormality, fracture or dislocation.

Other: No fluid collection or hematoma.
IMPRESSION: 1. Severe thinning of the supraspinatus tendon with a full-thickness
tear of the anterior supraspinatus tendon measuring approximately 12
mm in anterior- posterior dimension.
2. Moderate tendinosis of the infraspinatus tendon.
3. Moderate tendinosis of the intraarticular portion of the long
head of the biceps tendon with a small interstitial tear.
4. Mild subacromial/subdeltoid bursitis.

## 2018-08-21 IMAGING — DX DG FOOT COMPLETE 3+V*R*
3 series · 3 of 3 positions shown · non-contrast
Comparison: None.

CLINICAL DATA: Fell today.  Right foot pain.

EXAM:
RIGHT FOOT COMPLETE - 3+ VIEW

[foot ap]
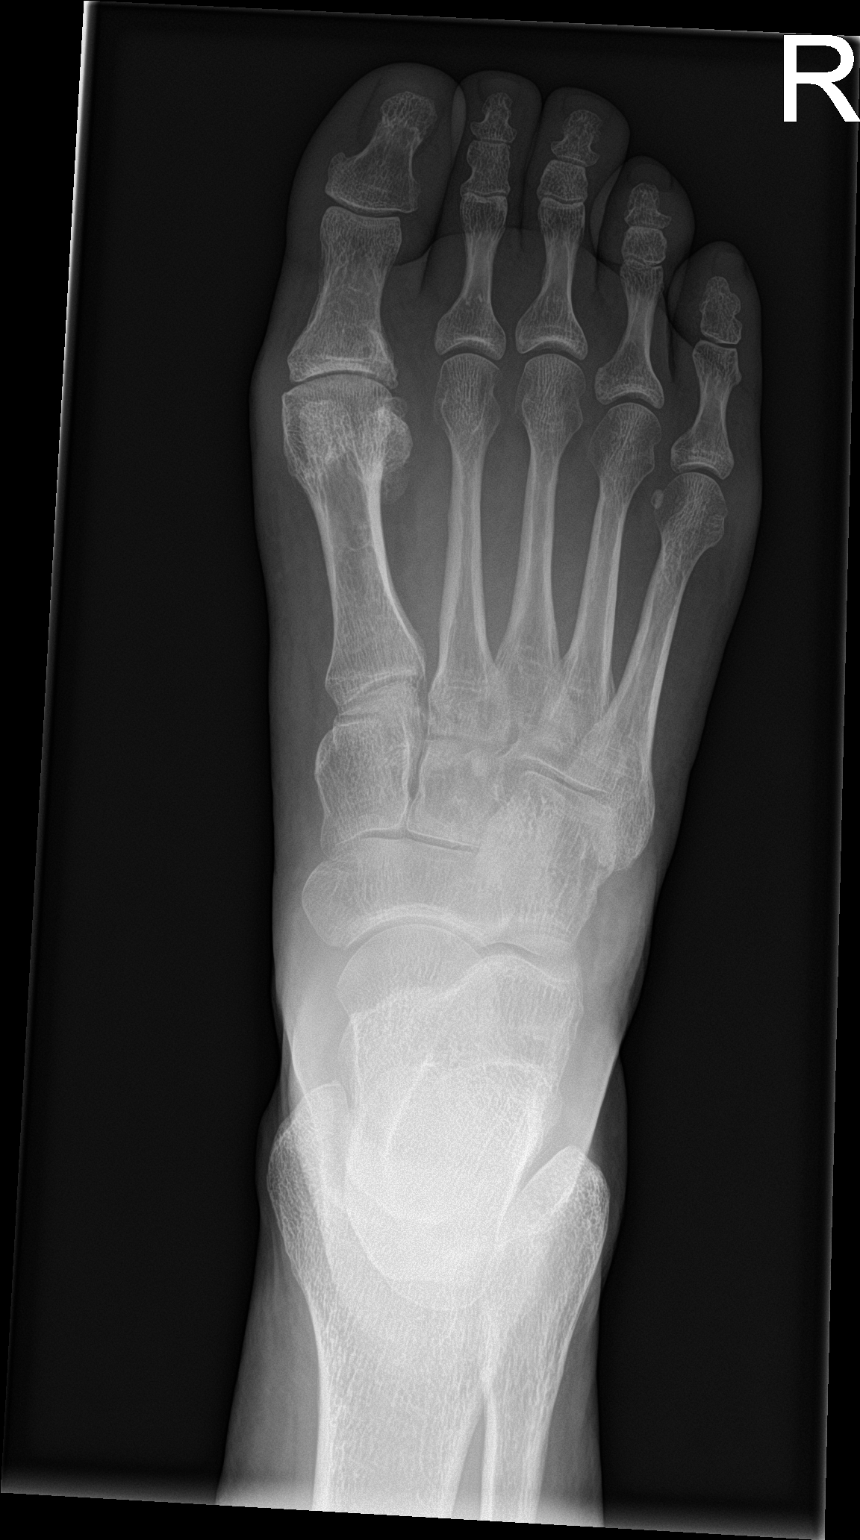

[foot obl]
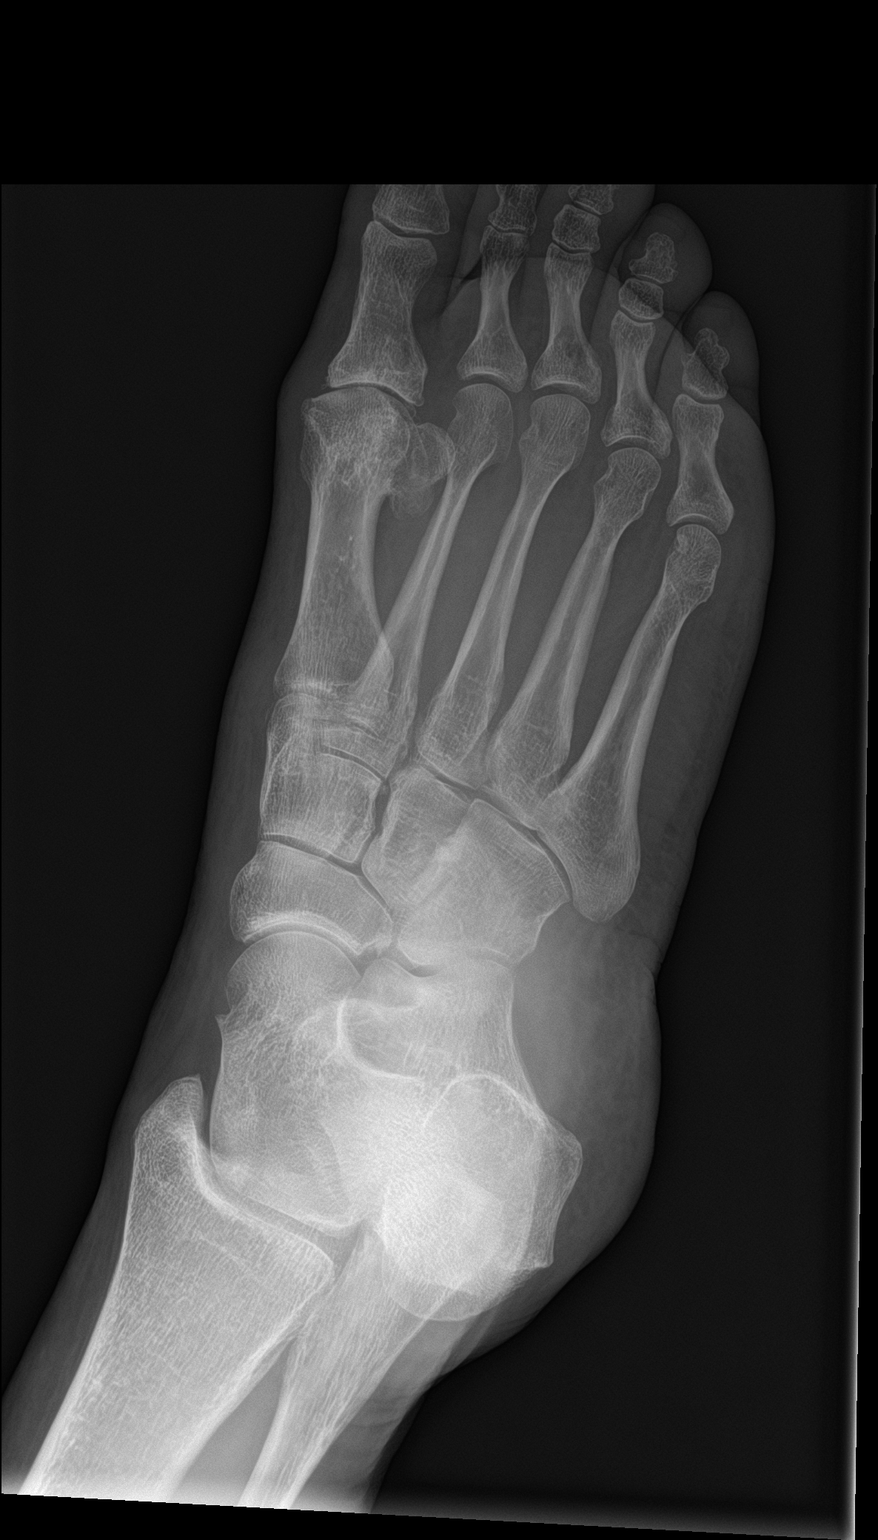

[foot lat]
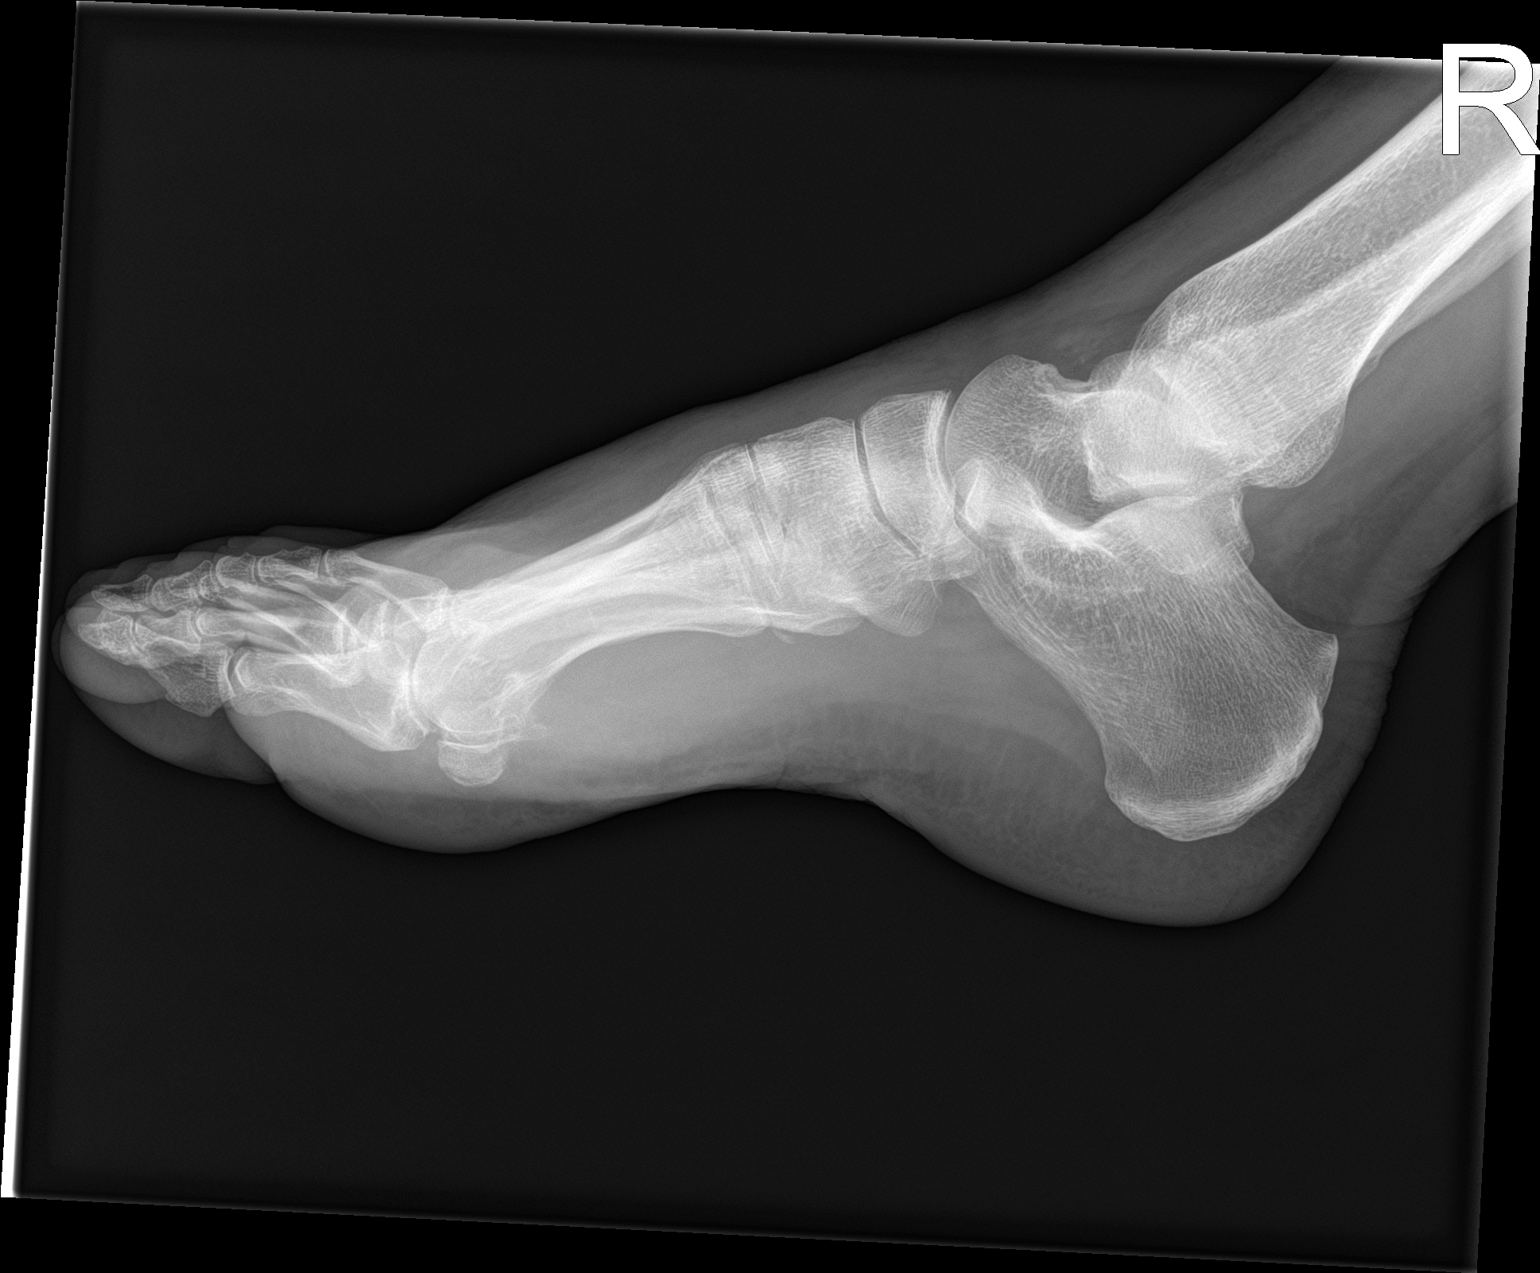

[3 of 3 positions shown; findings below may reference images not displayed]

FINDINGS: Moderate degenerative changes at the first metatarsal phalangeal
joint with joint space narrowing and spurring. The other joint
spaces are maintained. No significant degenerative changes. No acute
fracture is identified. Methiye Kskn noted.
IMPRESSION: No acute bony findings.

Moderate to advanced degenerative changes at the first metatarsal
phalangeal joint.

## 2018-08-23 ENCOUNTER — Encounter: Payer: Self-pay | Admitting: *Deleted

## 2018-09-05 IMAGING — CR DG FOOT COMPLETE 3+V*R*
3 series · 3 of 3 positions shown · non-contrast
Comparison: 12/09/2016

CLINICAL DATA: Fell 2 weeks ago, pain and swelling at top of RIGHT
foot

EXAM:
RIGHT FOOT COMPLETE - 3+ VIEW

[t foot ap right]
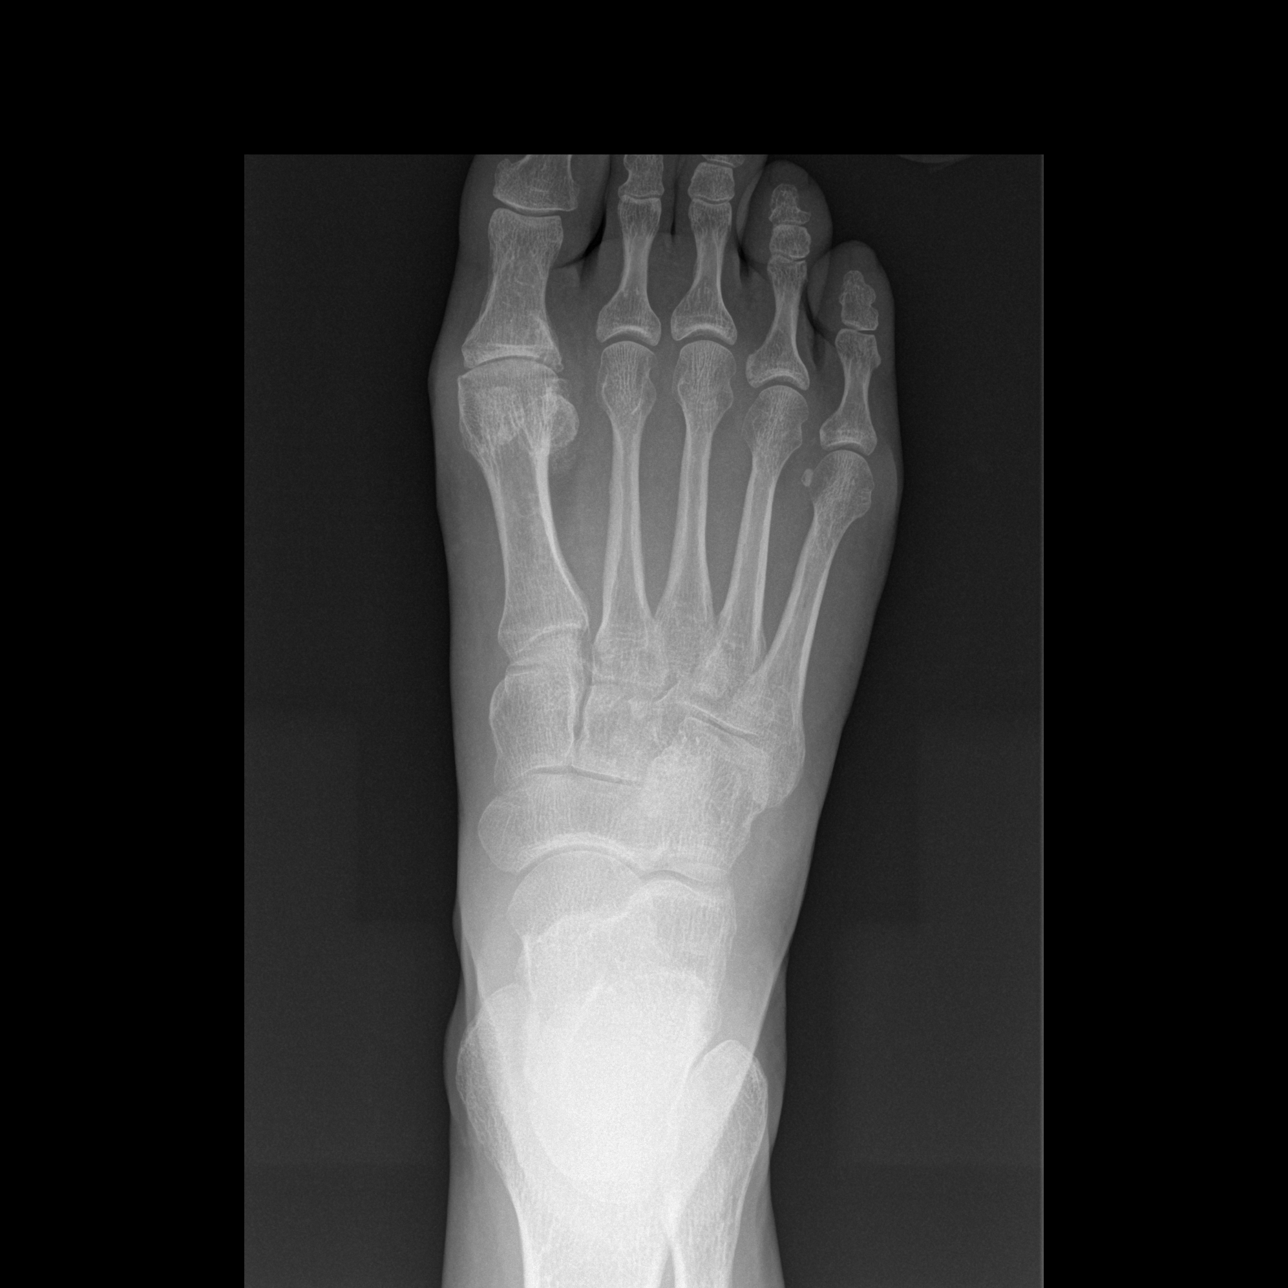

[t foot oblique right]
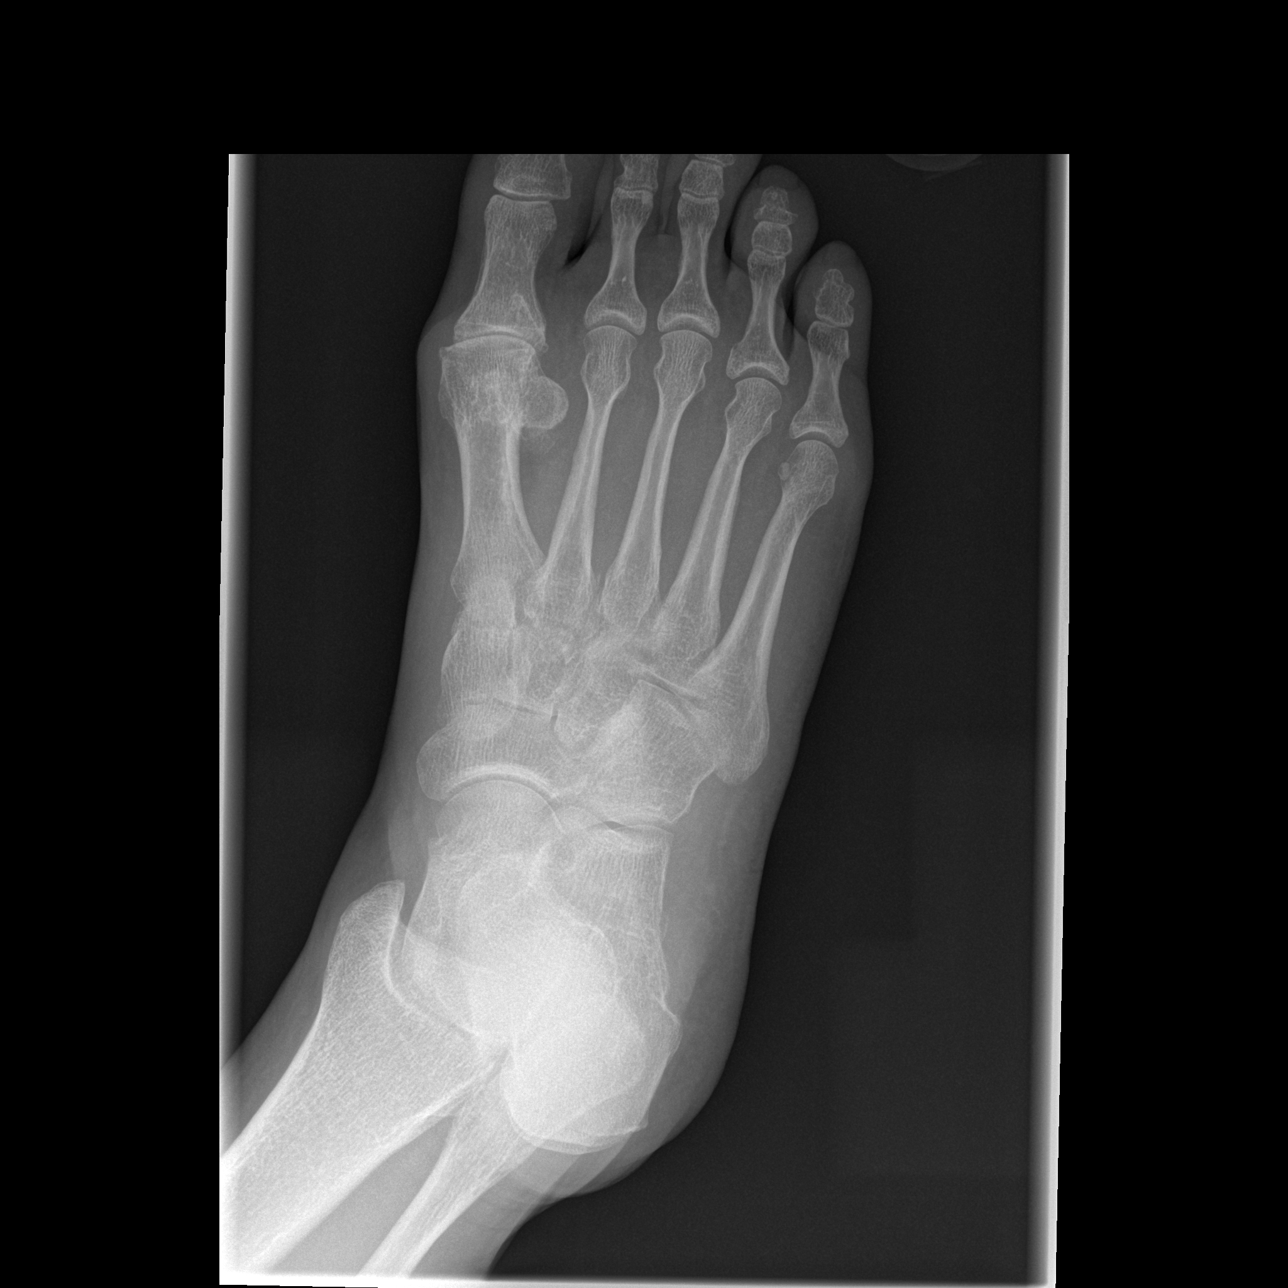

[t foot lat right]
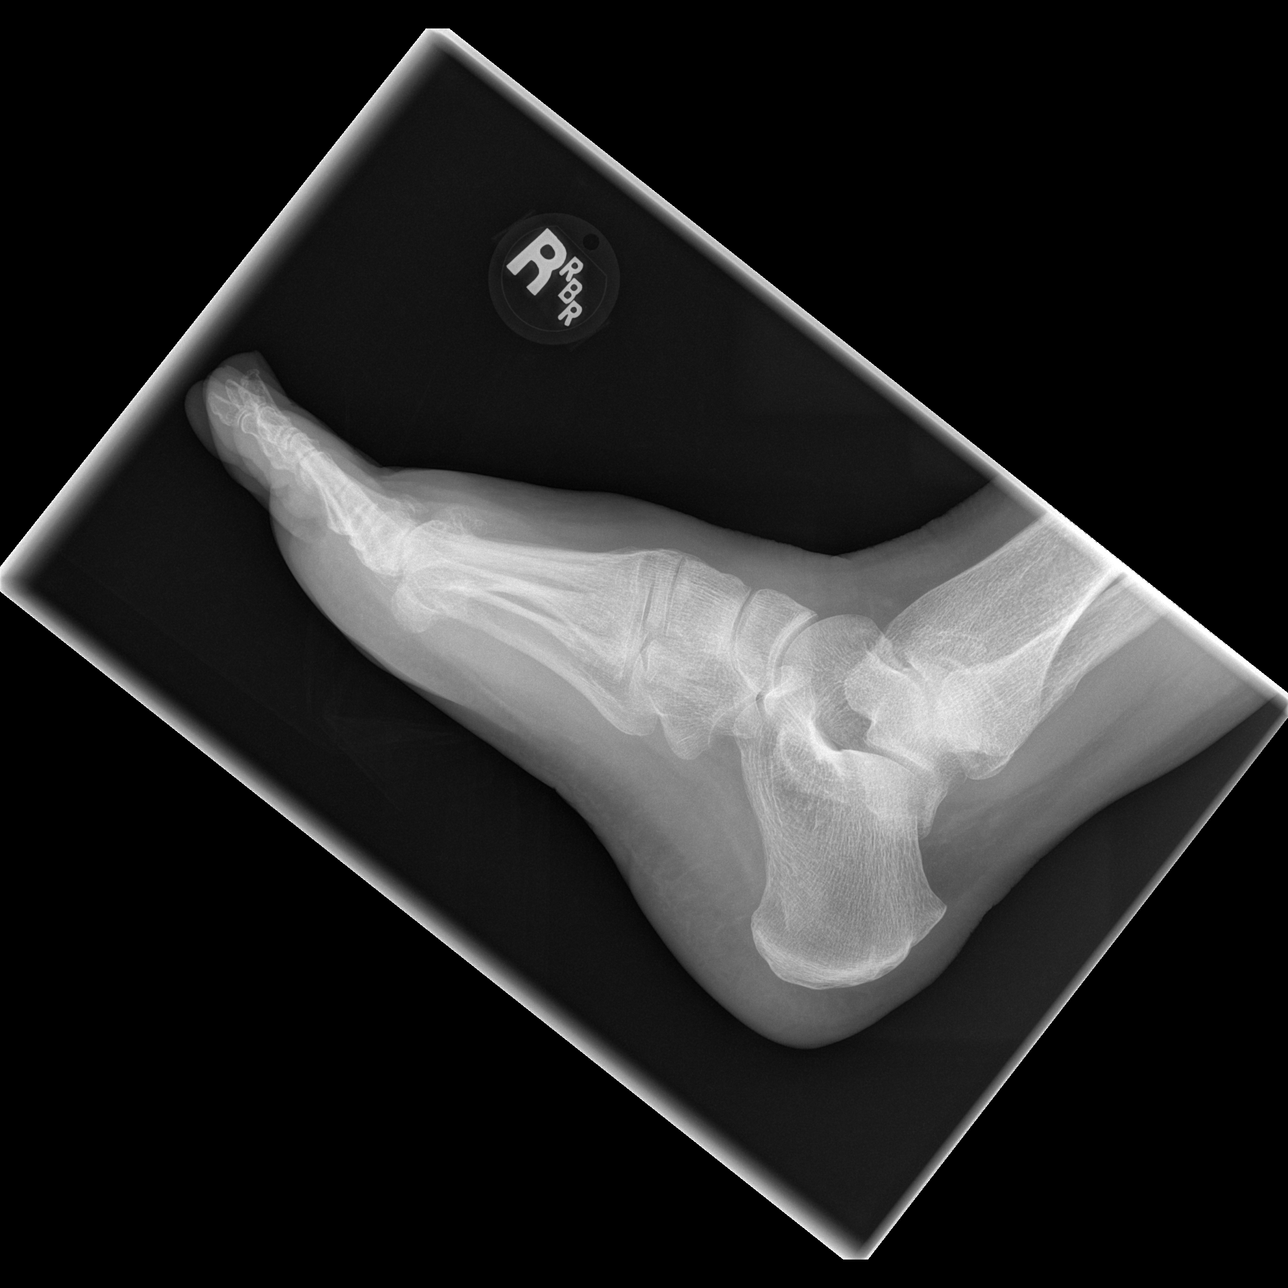

[3 of 3 positions shown; findings below may reference images not displayed]

FINDINGS: Mild osseous demineralization.

Degenerative changes first MTP joint with joint space narrowing and
spur formation.

Remain joint spaces preserved.

No acute fracture, dislocation or bone destruction.

Soft tissue swelling at dorsum of RIGHT foot increased since
previous exam.
IMPRESSION: Degenerative changes RIGHT first MTP joint.

Increased soft tissue swelling at dorsum of RIGHT foot.

No acute osseous abnormalities.

## 2018-09-06 ENCOUNTER — Encounter: Payer: Self-pay | Admitting: Gastroenterology

## 2018-09-27 DIAGNOSIS — Z6822 Body mass index (BMI) 22.0-22.9, adult: Secondary | ICD-10-CM | POA: Diagnosis not present

## 2018-09-27 DIAGNOSIS — Z124 Encounter for screening for malignant neoplasm of cervix: Secondary | ICD-10-CM | POA: Diagnosis not present

## 2018-09-27 DIAGNOSIS — Z1231 Encounter for screening mammogram for malignant neoplasm of breast: Secondary | ICD-10-CM | POA: Diagnosis not present

## 2018-10-02 ENCOUNTER — Encounter: Payer: Self-pay | Admitting: Family

## 2018-10-02 ENCOUNTER — Ambulatory Visit (INDEPENDENT_AMBULATORY_CARE_PROVIDER_SITE_OTHER): Payer: Medicare HMO | Admitting: Family

## 2018-10-02 ENCOUNTER — Other Ambulatory Visit: Payer: Self-pay | Admitting: Obstetrics and Gynecology

## 2018-10-02 VITALS — BP 122/74 | HR 74 | Temp 98.9°F | Resp 16 | Ht 62.0 in | Wt 118.0 lb

## 2018-10-02 DIAGNOSIS — B9789 Other viral agents as the cause of diseases classified elsewhere: Secondary | ICD-10-CM

## 2018-10-02 DIAGNOSIS — R928 Other abnormal and inconclusive findings on diagnostic imaging of breast: Secondary | ICD-10-CM

## 2018-10-02 DIAGNOSIS — J069 Acute upper respiratory infection, unspecified: Secondary | ICD-10-CM

## 2018-10-02 MED ORDER — BENZONATATE 100 MG PO CAPS: 100.0000 mg | ORAL_CAPSULE | Freq: Three times a day (TID) | ORAL | 0 refills | Status: DC | PRN

## 2018-10-02 MED ORDER — AZITHROMYCIN 250 MG PO TABS: ORAL_TABLET | ORAL | 0 refills | Status: DC

## 2018-10-02 NOTE — Progress Notes (Signed)
Subjective:    Patient ID: Natasha Trujillo, female    DOB: 1945/11/28, 72 y.o.   MRN: [id number]  HPI  Patient is a 72 year old female who presents today with complaint of congestion and cough.  She reports a low-grade temperature this morning with a rating of 100.  Reports that last Wednesday she developed sore throat. Began sneezing on Friday, nasal congestion. Coughed/sneezed yesterday. Last night 'coughed all night". Reports colored nasal congestion.     Review of Systems   See HPI  Past Medical History:  Diagnosis Date  . Hyperlipidemia   . Hypertension   . Osteopenia      Social History   Socioeconomic History  . Marital status: Married    Spouse name: Not on file  . Number of children: Not on file  . Years of education: Not on file  . Highest education level: Not on file  Occupational History  . Occupation: retired    Fish farm manager: RETIRED  Social Needs  . Financial resource strain: Not on file  . Food insecurity:    Worry: Not on file    Inability: Not on file  . Transportation needs:    Medical: Not on file    Non-medical: Not on file  Tobacco Use  . Smoking status: Never Smoker  . Smokeless tobacco: Never Used  Substance and Sexual Activity  . Alcohol use: Yes    Comment: occasional  . Drug use: No  . Sexual activity: Yes    Partners: Male  Lifestyle  . Physical activity:    Days per week: Not on file    Minutes per session: Not on file  . Stress: Not on file  Relationships  . Social connections:    Talks on phone: Not on file    Gets together: Not on file    Attends religious service: Not on file    Active member of club or organization: Not on file    Attends meetings of clubs or organizations: Not on file    Relationship status: Not on file  . Intimate partner violence:    Fear of current or ex partner: Not on file    Emotionally abused: Not on file    Physically abused: Not on file    Forced sexual activity: Not on file  Other  Topics Concern  . Not on file  Social History Narrative   Exercise--walk 4 miles 5 days a week    Past Surgical History:  Procedure Laterality Date  . BLEPHAROPLASTY Bilateral 08/2016   Dr Shelbie Proctor  . KNEE SURGERY Left 09/12/13  . SHOULDER SURGERY Right 01/05/2017   Dr.Caffrey  . TUBAL LIGATION      Family History  Problem Relation Age of Onset  . Dementia Mother   . Hypertension Mother   . Heart disease Mother 95       CABG  . Hypertension Father   . Heart disease Father 57       MI  . Diabetes Brother 16       type 1  . Hypertension Brother   . Heart disease Brother        cabg  . Cancer Maternal Aunt 68       cervical  . Diabetes Other        committed suicide  . Coronary artery disease Unknown        female 1st degree relative <50/female 1st degree relative <60  . Cervical cancer Unknown   . Lung cancer  Unknown   . Heart attack Son 28  . Colon cancer Neg Hx     Allergies  Allergen Reactions  . Lisinopril Other (See Comments)    Throat swelling  . Adrenalin [Epinephrine] Other (See Comments)    dizziness    Current Outpatient Medications on File Prior to Visit  Medication Sig Dispense Refill  . atorvastatin (LIPITOR) 40 MG tablet Take 1 tablet (40 mg total) by mouth daily. 90 tablet 3  . buPROPion (WELLBUTRIN XL) 150 MG 24 hr tablet TAKE 1 TABLET EVERY MORNING 90 tablet 1  . Calcium 1500 MG tablet Take 1,500 mg by mouth daily.     . carvedilol (COREG) 3.125 MG tablet Take 3.125 mg by mouth 2 (two) times daily with a meal.    . chlorthalidone (HYGROTON) 25 MG tablet Take 25 mg by mouth daily.    . Cholecalciferol (VITAMIN D-3 PO) Take 800 Units by mouth daily.    . Multiple Vitamins-Minerals (MULTIVITAMIN PO) Take 1 tablet by mouth daily.    Marland Kitchen olmesartan (BENICAR) 40 MG tablet Take 1 tablet (40 mg total) by mouth daily. 90 tablet 3  . Omega-3 Fatty Acids (FISH OIL) 1000 MG CAPS Take 1 capsule by mouth daily.      . vitamin E 400 UNIT capsule Take 400  Units by mouth daily.      No current facility-administered medications on file prior to visit.     BP 122/74 (BP Location: Right Arm, Patient Position: Sitting, Cuff Size: Small)   Pulse 74   Temp 98.9 F (37.2 C) (Oral)   Resp 16   Ht 5\' 2"  (1.575 m)   Wt 118 lb (53.5 kg)   SpO2 100%   BMI 21.58 kg/m        Objective:   Physical Exam  Constitutional: She appears well-developed and well-nourished.  HENT:  Head: Normocephalic and atraumatic.  Right Ear: Ear canal normal.  Mouth/Throat: No oropharyngeal exudate, posterior oropharyngeal edema or posterior oropharyngeal erythema.  Right Tympanostomy tube in place L tube is laying in ear canal in wax L TM intact without perforation  No TM erythema noted   Neck: Neck supple.  Cardiovascular: Normal rate, regular rhythm and normal heart sounds.  No murmur heard. Pulmonary/Chest: Effort normal and breath sounds normal. No respiratory distress. She has no wheezes.  Lymphadenopathy:    She has no cervical adenopathy.  Psychiatric: She has a normal mood and affect. Her behavior is normal. Judgment and thought content normal.          Assessment & Plan:  Viral URI with cough-advised patient that symptoms most likely viral at this time however if her cough worsens or if is not improved in 1 to 2 days then I would recommend she go ahead and begin a azithromycin.  I have asked her let us know if she develops shortness of breath or fever over 101.  I have also sent a prescription for Tessalon as needed for cough.  I did attempt to remove a large chunk of wax with her tympanostomy tube embedded near the external ear canal.  She had some discomfort so I discontinued and advised her to follow-up with her ENT for further evaluation.  She notes that she is been having some issues hearing out of his left ear and I suspect that it is due to the fact that her tube is no longer in place.

## 2018-10-02 NOTE — Patient Instructions (Signed)
Please begin azithromycin if your cough/symptoms worsen or if they are not improved in 1-2 days. You may use tessalon as needed for cough.  Tylenol as needed for fever.  If fever >101 or if you develop shortness of breath please call. Follow up with Dr. Lucia Gaskins re: your left ear tube being out.

## 2018-10-11 ENCOUNTER — Ambulatory Visit: Payer: Medicare HMO

## 2018-10-11 ENCOUNTER — Ambulatory Visit
Admission: RE | Admit: 2018-10-11 | Discharge: 2018-10-11 | Disposition: A | Discharge status: Home / Self Care | Payer: Medicare HMO | Source: Ambulatory Visit | Attending: Obstetrics and Gynecology | Admitting: Obstetrics and Gynecology

## 2018-10-11 DIAGNOSIS — R922 Inconclusive mammogram: Secondary | ICD-10-CM | POA: Diagnosis not present

## 2018-10-11 DIAGNOSIS — R928 Other abnormal and inconclusive findings on diagnostic imaging of breast: Secondary | ICD-10-CM

## 2018-10-13 DIAGNOSIS — H04123 Dry eye syndrome of bilateral lacrimal glands: Secondary | ICD-10-CM | POA: Diagnosis not present

## 2018-10-13 DIAGNOSIS — H25813 Combined forms of age-related cataract, bilateral: Secondary | ICD-10-CM | POA: Diagnosis not present

## 2018-10-17 ENCOUNTER — Encounter: Payer: Self-pay | Admitting: Gastroenterology

## 2018-10-19 ENCOUNTER — Other Ambulatory Visit: Payer: Self-pay | Admitting: Family Medicine

## 2018-11-21 ENCOUNTER — Encounter: Payer: Self-pay | Admitting: Gastroenterology

## 2018-11-21 ENCOUNTER — Ambulatory Visit (AMBULATORY_SURGERY_CENTER): Payer: Self-pay

## 2018-11-21 VITALS — Ht 62.0 in | Wt 120.6 lb

## 2018-11-21 DIAGNOSIS — Z86010 Personal history of colonic polyps: Secondary | ICD-10-CM

## 2018-11-21 MED ORDER — PEG 3350-KCL-NA BICARB-NACL 420 G PO SOLR: 4000.0000 mL | Freq: Once | ORAL | 0 refills | Status: AC

## 2018-11-21 NOTE — Progress Notes (Signed)
Denies allergies to eggs or soy products. Denies complication of anesthesia or sedation. Denies use of weight loss medication. Denies use of O2.   Emmi instructions declined.  

## 2018-12-04 ENCOUNTER — Telehealth: Payer: Self-pay | Admitting: Gastroenterology

## 2018-12-04 NOTE — Telephone Encounter (Signed)
Patient called feeling nauseated and febrile prior to starting her purgative prep for her colonoscopy with Dr. Ardis Hughs tomorrow. Afraid she is coming down with the flu and did not want to start drinking the prep. Grandchildren have recently been ill.  Asked to reschedule her colonoscopy for tomorrow.

## 2018-12-05 ENCOUNTER — Encounter: Payer: Medicare HMO | Admitting: Gastroenterology

## 2018-12-05 NOTE — Telephone Encounter (Signed)
Please reschedule pts colonoscopy with Dr. Ardis Hughs in the [facility].

## 2018-12-05 NOTE — Telephone Encounter (Signed)
Left message to call back to r/s procedure.

## 2019-01-05 ENCOUNTER — Other Ambulatory Visit: Payer: Self-pay | Admitting: Interventional Cardiology

## 2019-01-11 ENCOUNTER — Telehealth: Payer: Self-pay | Admitting: Gastroenterology

## 2019-01-11 NOTE — Telephone Encounter (Signed)
OK. Thanks.

## 2019-01-11 NOTE — Telephone Encounter (Signed)
Dr. Carlean Purl do you approve with this switch as well? From previous conversation.

## 2019-01-11 NOTE — Telephone Encounter (Signed)
That is OK with me if it is OK with Dr. Carlean Purl.

## 2019-01-11 NOTE — Telephone Encounter (Signed)
The patient would like to switch doctor. She would like to be seen by Dr. Carlean Purl due to him also being the patients husband Doctor. Do you approve on the Transfer of provider?

## 2019-01-28 ENCOUNTER — Other Ambulatory Visit: Payer: Self-pay | Admitting: Interventional Cardiology

## 2019-01-31 ENCOUNTER — Encounter: Payer: Medicare HMO | Admitting: Internal Medicine

## 2019-03-13 ENCOUNTER — Telehealth: Payer: Self-pay | Admitting: *Deleted

## 2019-03-13 DIAGNOSIS — Z86010 Personal history of colonic polyps: Secondary | ICD-10-CM

## 2019-03-13 MED ORDER — PEG 3350-KCL-NABCB-NACL-NASULF 236 G PO SOLR: ORAL | 0 refills | Status: DC

## 2019-03-13 NOTE — Telephone Encounter (Signed)
Received this note from Dr. Carlean Purl- If possible can you change her to a MiraLax prep   I think she will appreciate it   She was going to see Ardis Hughs but switched to me (husband sees me)   If cannot be done not a big deal    I spoke with pt and she is agreeable to this. New instructions sent via MyChart.  I told her to disregard the Golytely instructions she had previously received.  Understanding voiced.

## 2019-03-13 NOTE — Telephone Encounter (Signed)
PT scheduled for colonoscopy. She had a previsit and has her instructions but needs prep sent to the pharmacy which is [facility] on [address]. SM

## 2019-03-13 NOTE — Telephone Encounter (Signed)
Golytely prep sent to pt's pharmacy

## 2019-03-26 ENCOUNTER — Telehealth: Payer: Self-pay | Admitting: *Deleted

## 2019-03-26 NOTE — Telephone Encounter (Signed)
No answer for first covid screen call will call back. SM

## 2019-03-27 ENCOUNTER — Telehealth: Payer: Self-pay

## 2019-03-27 NOTE — Telephone Encounter (Signed)
Covid-19 travel screening questions  Have you traveled in the last 14 days? No. If yes where? N/A  Do you now or have you had a fever in the last 14 days? No.  Do you have any respiratory symptoms of shortness of breath or cough now or in the last 14 days? No.   Do you have any family members or close contacts with diagnosed or suspected Covid-19? No.

## 2019-03-28 ENCOUNTER — Ambulatory Visit (AMBULATORY_SURGERY_CENTER): Payer: Medicare HMO | Admitting: Internal Medicine

## 2019-03-28 ENCOUNTER — Other Ambulatory Visit: Payer: Self-pay

## 2019-03-28 ENCOUNTER — Encounter: Payer: Self-pay | Admitting: Internal Medicine

## 2019-03-28 VITALS — BP 133/70 | HR 67 | Temp 98.5°F | Resp 14 | Ht 62.0 in | Wt 120.0 lb

## 2019-03-28 DIAGNOSIS — K633 Ulcer of intestine: Secondary | ICD-10-CM

## 2019-03-28 DIAGNOSIS — Z86010 Personal history of colonic polyps: Secondary | ICD-10-CM

## 2019-03-28 DIAGNOSIS — D123 Benign neoplasm of transverse colon: Secondary | ICD-10-CM

## 2019-03-28 MED ORDER — SODIUM CHLORIDE 0.9 % IV SOLN: 500.0000 mL | Freq: Once | INTRAVENOUS | Status: DC

## 2019-03-28 NOTE — Progress Notes (Signed)
Pt's states no medical or surgical changes since previsit or office visit.  Temp- Courtney V/S- Bethena Roys

## 2019-03-28 NOTE — Progress Notes (Signed)
Called to room to assist during endoscopic procedure.  Patient ID and intended procedure confirmed with present staff. Received instructions for my participation in the procedure from the performing physician.  

## 2019-03-28 NOTE — Progress Notes (Signed)
To pacu, vss. Report to Rn.tb 

## 2019-03-28 NOTE — Op Note (Signed)
[facility] Patient Name: Natasha Trujillo Procedure Date: 03/28/2019 8:51 AM MRN: [id number] Endoscopist: Gatha Mayer , MD Age: 73 Referring MD:  Date of Birth: [birth year] Gender: Female Account #: [id number] Procedure:                Colonoscopy Indications:              Surveillance: Personal history of adenomatous                            polyps on last colonoscopy > 5 years ago Medicines:                Propofol per Anesthesia, Monitored Anesthesia Care Procedure:                Pre-Anesthesia Assessment:                           - Prior to the procedure, a History and Physical                            was performed, and patient medications and                            allergies were reviewed. The patient's tolerance of                            previous anesthesia was also reviewed. The risks                            and benefits of the procedure and the sedation                            options and risks were discussed with the patient.                            All questions were answered, and informed consent                            was obtained. Prior Anticoagulants: The patient has                            taken no previous anticoagulant or antiplatelet                            agents. ASA Grade Assessment: II - A patient with                            mild systemic disease. After reviewing the risks                            and benefits, the patient was deemed in                            satisfactory condition to undergo the procedure.  After obtaining informed consent, the colonoscope                            was passed under direct vision. Throughout the                            procedure, the patient's blood pressure, pulse, and                            oxygen saturations were monitored continuously. The                            Colonoscope was introduced through the anus and                             advanced to the the cecum, identified by                            appendiceal orifice and ileocecal valve. The                            colonoscopy was somewhat difficult due to a                            tortuous colon. Successful completion of the                            procedure was aided by withdrawing and reinserting                            the scope. The patient tolerated the procedure                            well. The quality of the bowel preparation was                            excellent. The bowel preparation used was Miralax                            via split dose instruction. Scope In: 8:57:57 AM Scope Out: 9:21:39 AM Scope Withdrawal Time: 0 hours 14 minutes 6 seconds  Total Procedure Duration: 0 hours 23 minutes 42 seconds  Findings:                 The perianal and digital rectal examinations were                            normal.                           A diminutive polyp was found in the transverse                            colon. The polyp was flat. The polyp was removed  with a cold snare. Resection and retrieval were                            complete. Verification of patient identification                            for the specimen was done. Estimated blood loss was                            minimal.                           A patchy area of ulcerated mucosa was found in the                            sigmoid colon. Biopsies were taken with a cold                            forceps for histology. Verification of patient                            identification for the specimen was done. Estimated                            blood loss was minimal.                           A few diverticula were found in the sigmoid colon.                           The exam was otherwise without abnormality on                            direct and retroflexion views. Complications:            No immediate  complications. Estimated Blood Loss:     Estimated blood loss was minimal. Impression:               - One diminutive polyp in the transverse colon,                            removed with a cold snare. Resected and retrieved.                           - Ulcerated mucosa in the sigmoid colon. Biopsied.                           - Mild diverticulosis in the sigmoid colon.                           - The examination was otherwise normal on direct                            and retroflexion views.                           -  Personal history of colonic polyps. 12 mm adenoma                            removed 2003 - none since until today Recommendation:           - Patient has a contact number available for                            emergencies. The signs and symptoms of potential                            delayed complications were discussed with the                            patient. Return to normal activities tomorrow.                            Written discharge instructions were provided to the                            patient.                           - Resume previous diet.                           - Continue present medications.                           - Await pathology results.                           - Repeat colonoscopy is recommended. The                            colonoscopy date will be determined after pathology                            results from today's exam become available for                            review. Gatha Mayer, MD 03/28/2019 9:33:07 AM This report has been signed electronically.

## 2019-03-28 NOTE — Patient Instructions (Addendum)
I found and removed one tiny polyp.  There were some scattered tiny ulcers seen and biopsied. Most likely came from the preparation.  I will let you know results and plans by mail or My Chart.  I appreciate the opportunity to care for you. Gatha Mayer, MD, FACG YOU HAD AN ENDOSCOPIC PROCEDURE TODAY AT [facility]:   Refer to the procedure report that was given to you for any specific questions about what was found during the examination.  If the procedure report does not answer your questions, please call your gastroenterologist to clarify.  If you requested that your care partner not be given the details of your procedure findings, then the procedure report has been included in a sealed envelope for you to review at your convenience later.  YOU SHOULD EXPECT: Some feelings of bloating in the abdomen. Passage of more gas than usual.  Walking can help get rid of the air that was put into your GI tract during the procedure and reduce the bloating. If you had a lower endoscopy (such as a colonoscopy or flexible sigmoidoscopy) you may notice spotting of blood in your stool or on the toilet paper. If you underwent a bowel prep for your procedure, you may not have a normal bowel movement for a few days.  Please Note:  You might notice some irritation and congestion in your nose or some drainage.  This is from the oxygen used during your procedure.  There is no need for concern and it should clear up in a day or so.  SYMPTOMS TO REPORT IMMEDIATELY:   Following lower endoscopy (colonoscopy or flexible sigmoidoscopy):  Excessive amounts of blood in the stool  Significant tenderness or worsening of abdominal pains  Swelling of the abdomen that is new, acute  Fever of 100F or higher   Following upper endoscopy (EGD)  Vomiting of blood or coffee ground material  New chest pain or pain under the shoulder blades  Painful or persistently difficult swallowing  New shortness of  breath  Fever of 100F or higher  Black, tarry-looking stools  For urgent or emergent issues, a gastroenterologist can be reached at any hour by calling [phone].   DIET:  We do recommend a small meal at first, but then you may proceed to your regular diet.  Drink plenty of fluids but you should avoid alcoholic beverages for 24 hours.  ACTIVITY:  You should plan to take it easy for the rest of today and you should NOT DRIVE or use heavy machinery until tomorrow (because of the sedation medicines used during the test).    FOLLOW UP: Our staff will call the number listed on your records 48-72 hours following your procedure to check on you and address any questions or concerns that you may have regarding the information given to you following your procedure. If we do not reach you, we will leave a message.  We will attempt to reach you two times.  During this call, we will ask if you have developed any symptoms of COVID 19. If you develop any symptoms (for example fever, flu-like symptoms, shortness of breath, cough etc.) before then, please call [phone].  If any biopsies were taken you will be contacted by phone or by letter within the next 1-3 weeks.  Please call us at [phone] if you have not heard about the biopsies in 3 weeks.   Polyps (handout given) Await for biopsy results Diverticulosis (handout given)  SIGNATURES/CONFIDENTIALITY: You  and/or your care partner have signed paperwork which will be entered into your electronic medical record.  These signatures attest to the fact that that the information above on your After Visit Summary has been reviewed and is understood.  Full responsibility of the confidentiality of this discharge information lies with you and/or your care-partner.

## 2019-04-03 ENCOUNTER — Telehealth: Payer: Self-pay | Admitting: *Deleted

## 2019-04-03 ENCOUNTER — Telehealth: Payer: Self-pay

## 2019-04-03 ENCOUNTER — Encounter: Payer: Self-pay | Admitting: Internal Medicine

## 2019-04-03 DIAGNOSIS — Z86010 Personal history of colonic polyps: Secondary | ICD-10-CM | POA: Insufficient documentation

## 2019-04-03 DIAGNOSIS — Z860101 Personal history of adenomatous and serrated colon polyps: Secondary | ICD-10-CM | POA: Insufficient documentation

## 2019-04-03 NOTE — Progress Notes (Signed)
My Chart letter 1 adenoma Benign colonic mucosa on ulcers Recall -  5 years - to discuss possible repeat

## 2019-04-03 NOTE — Telephone Encounter (Signed)
Left message on follow up call. 

## 2019-04-03 NOTE — Telephone Encounter (Signed)
Left message on 2nd f/u call 

## 2019-04-17 DIAGNOSIS — H6123 Impacted cerumen, bilateral: Secondary | ICD-10-CM | POA: Diagnosis not present

## 2019-04-17 DIAGNOSIS — H7203 Central perforation of tympanic membrane, bilateral: Secondary | ICD-10-CM | POA: Diagnosis not present

## 2019-04-17 DIAGNOSIS — H906 Mixed conductive and sensorineural hearing loss, bilateral: Secondary | ICD-10-CM | POA: Diagnosis not present

## 2019-04-23 DIAGNOSIS — H6123 Impacted cerumen, bilateral: Secondary | ICD-10-CM | POA: Diagnosis not present

## 2019-04-23 DIAGNOSIS — H906 Mixed conductive and sensorineural hearing loss, bilateral: Secondary | ICD-10-CM | POA: Diagnosis not present

## 2019-04-30 ENCOUNTER — Encounter: Payer: Self-pay | Admitting: Family Medicine

## 2019-05-02 ENCOUNTER — Other Ambulatory Visit: Payer: Self-pay | Admitting: Family Medicine

## 2019-05-02 DIAGNOSIS — H9193 Unspecified hearing loss, bilateral: Secondary | ICD-10-CM

## 2019-05-02 NOTE — Telephone Encounter (Signed)
Relation to pt: self  Call back number:  [phone]    Reason for call:  Patient checking on the status of MyChart message. Informed patient message was received Tuesday 05/01/2019 please allow 24 hour turn around. Patient seeking a response back today thru My Chart at least 2 surgeons PCP recommends, please advise

## 2019-05-09 ENCOUNTER — Telehealth: Payer: Self-pay | Admitting: Family Medicine

## 2019-05-09 NOTE — Telephone Encounter (Signed)
Copied from [place] [phone]. Topic: General - Other >> May 09, 2019  1:21 PM [name] wrote: Reason for CRM: Patient is calling because she still has not heard anything regarding her referral to [facility] ent.  Patient would like a number to call them if she needs to.  Please call patient with an update.

## 2019-05-09 NOTE — Telephone Encounter (Signed)
Please check on referral and let pt know

## 2019-05-09 NOTE — Telephone Encounter (Signed)
[name]- can you check on referral please?

## 2019-05-10 ENCOUNTER — Encounter: Payer: Self-pay | Admitting: Family Medicine

## 2019-05-14 NOTE — Telephone Encounter (Signed)
Pt has appt on 7/9 with [facility], pt has been contacted

## 2019-05-25 ENCOUNTER — Other Ambulatory Visit: Payer: Self-pay | Admitting: Family Medicine

## 2019-06-18 ENCOUNTER — Ambulatory Visit: Payer: Medicare HMO | Admitting: *Deleted

## 2019-06-28 ENCOUNTER — Ambulatory Visit: Payer: Medicare HMO | Admitting: *Deleted

## 2019-07-26 DIAGNOSIS — H7403 Tympanosclerosis, bilateral: Secondary | ICD-10-CM | POA: Diagnosis not present

## 2019-07-26 DIAGNOSIS — H906 Mixed conductive and sensorineural hearing loss, bilateral: Secondary | ICD-10-CM | POA: Diagnosis not present

## 2019-07-26 DIAGNOSIS — R42 Dizziness and giddiness: Secondary | ICD-10-CM | POA: Diagnosis not present

## 2019-07-26 DIAGNOSIS — H938X1 Other specified disorders of right ear: Secondary | ICD-10-CM | POA: Diagnosis not present

## 2019-07-26 DIAGNOSIS — H7292 Unspecified perforation of tympanic membrane, left ear: Secondary | ICD-10-CM | POA: Diagnosis not present

## 2019-07-27 ENCOUNTER — Other Ambulatory Visit: Payer: Self-pay | Admitting: Interventional Cardiology

## 2019-08-14 ENCOUNTER — Other Ambulatory Visit: Payer: Self-pay | Admitting: Interventional Cardiology

## 2019-08-27 ENCOUNTER — Other Ambulatory Visit: Payer: Self-pay | Admitting: Family Medicine

## 2019-09-11 NOTE — Progress Notes (Signed)
Cardiology Office Note:    Date:  09/12/2019   ID:  Natasha Trujillo, DOB 09/21/1946, MRN [id number]  PCP:  Carollee Herter, Alferd Apa, DO  Cardiologist:  Sinclair Grooms, MD   Referring MD: Carollee Herter, Alferd Apa, *   Chief Complaint  Patient presents with  . Hypertension  . Hyperlipidemia  . Advice Only    History of Present Illness:    Natasha Trujillo is a 73 y.o. female with a hx of essential hypertension, family h/o premature CAD, and hypertension.  [name] has no complaints.  Specifically, no chest discomfort, palpitations, headache, shortness of breath, syncope, or peripheral edema.  Medications are taken as prescribed.  Alcohol is not an issue.  Salt intake is not limited.  She does not use nonsteroidal anti-inflammatory therapy.  Past Medical History:  Diagnosis Date  . Hyperlipidemia   . Hypertension   . Osteopenia   . Osteoporosis     Past Surgical History:  Procedure Laterality Date  . BLEPHAROPLASTY Bilateral 08/2016   Dr Shelbie Proctor  . KNEE SURGERY Left 09/12/13  . SHOULDER SURGERY Right 01/05/2017   Dr.Caffrey  . TUBAL LIGATION      Current Medications: Current Meds  Medication Sig  . atorvastatin (LIPITOR) 40 MG tablet TAKE 1 TABLET EVERY DAY (DOSE CHANGE)  . buPROPion (WELLBUTRIN XL) 150 MG 24 hr tablet TAKE 1 TABLET EVERY MORNING  , NEED MD APPOINTMENT FOR REFILLS  . Calcium 1500 MG tablet Take 1,500 mg by mouth daily.   . chlorthalidone (HYGROTON) 25 MG tablet TAKE 1 TABLET (25 MG TOTAL) DAILY.  Marland Kitchen Cholecalciferol (VITAMIN D-3 PO) Take 800 Units by mouth daily.  . Multiple Vitamins-Minerals (MULTIVITAMIN PO) Take 1 tablet by mouth daily.  Marland Kitchen olmesartan (BENICAR) 40 MG tablet Take 1 tablet (40 mg total) by mouth daily.  . Omega-3 Fatty Acids (FISH OIL) 1000 MG CAPS Take 1 capsule by mouth daily.    . vitamin E 400 UNIT capsule Take 400 Units by mouth daily.   . [DISCONTINUED] carvedilol (COREG) 3.125 MG tablet TAKE 1 TABLET TWICE DAILY WITH  MEALS   Current Facility-Administered Medications for the 09/12/19 encounter (Office Visit) with Belva Crome, MD  Medication  . 0.9 %  sodium chloride infusion     Allergies:   Lisinopril and Adrenalin [epinephrine]   Social History   Socioeconomic History  . Marital status: Married    Spouse name: Not on file  . Number of children: Not on file  . Years of education: Not on file  . Highest education level: Not on file  Occupational History  . Occupation: retired    Fish farm manager: RETIRED  Social Needs  . Financial resource strain: Not on file  . Food insecurity    Worry: Not on file    Inability: Not on file  . Transportation needs    Medical: Not on file    Non-medical: Not on file  Tobacco Use  . Smoking status: Never Smoker  . Smokeless tobacco: Never Used  Substance and Sexual Activity  . Alcohol use: Yes    Comment: occasional  . Drug use: No  . Sexual activity: Yes    Partners: Male  Lifestyle  . Physical activity    Days per week: Not on file    Minutes per session: Not on file  . Stress: Not on file  Relationships  . Social Herbalist on phone: Not on file    Gets together: Not  on file    Attends religious service: Not on file    Active member of club or organization: Not on file    Attends meetings of clubs or organizations: Not on file    Relationship status: Not on file  Other Topics Concern  . Not on file  Social History Narrative   Exercise--walk 4 miles 5 days a week     Family History: The patient's family history includes Cancer (age of onset: 59) in her maternal aunt; Cervical cancer in an other family member; Coronary artery disease in an other family member; Dementia in her mother; Diabetes in an other family member; Diabetes (age of onset: 51) in her brother; Heart attack (age of onset: 34) in her son; Heart disease in her brother; Heart disease (age of onset: 64) in her father and mother; Hypertension in her brother, father, and  mother; Lung cancer in an other family member. There is no history of Colon cancer, Esophageal cancer, Rectal cancer, or Stomach cancer.  ROS:   Please see the history of present illness.    She walks at least an hour and 15 minutes 5 days/week.  All other systems reviewed and are negative.  EKGs/Labs/Other Studies Reviewed:    The following studies were reviewed today: New data  EKG:  EKG Healthy appearing is normal.  Heart rate is 68 bpm.  Appearance is normal.  There is no change compared to the prior.  Recent Labs: No results found for requested labs within last 8760 hours.  Recent Lipid Panel    Component Value Date/Time   CHOL 139 03/06/2018 0859   TRIG 42.0 03/06/2018 0859   HDL 72.20 03/06/2018 0859   CHOLHDL 2 03/06/2018 0859   VLDL 8.4 03/06/2018 0859   LDLCALC 59 03/06/2018 0859    Physical Exam:    VS:  BP (!) 168/88   Pulse 68   Ht 5\' 2"  (1.575 m)   Wt 119 lb 3.2 oz (54.1 kg)   SpO2 98%   BMI 21.80 kg/m     Wt Readings from Last 3 Encounters:  09/12/19 119 lb 3.2 oz (54.1 kg)  03/28/19 120 lb (54.4 kg)  11/21/18 120 lb 9.6 oz (54.7 kg)     GEN: Than stated age in appearance. No acute distress HEENT: Normal NECK: No JVD. LYMPHATICS: No lymphadenopathy CARDIAC:  RRR without murmur, gallop, or edema. VASCULAR:  Normal Pulses. No bruits. RESPIRATORY:  Clear to auscultation without rales, wheezing or rhonchi  ABDOMEN: Soft, non-tender, non-distended, No pulsatile mass, MUSCULOSKELETAL: No deformity  SKIN: Warm and dry NEUROLOGIC:  Alert and oriented x 3 PSYCHIATRIC:  Normal affect   ASSESSMENT:    1. Family history of early CAD   2. Hyperlipidemia LDL goal <100   3. Essential hypertension   4. Educated about COVID-19 virus infection    PLAN:    In order of problems listed above:  1. Noted. 2. LDL cholesterol was at target in April 2019.  Will schedule blood work to be done sometime in the next 2 weeks.  She has had none since April 2019. 3.  Blood pressure is not at target.  Systolic is consistently elevated above 140.  Increase carvedilol to 6.25 mg p.o. twice daily and report results with home obtained recordings via MyChart over the next 2 weeks. 4. The 3W's are discussed and endorsed.  Overall education and awareness concerning primary risk prevention was discussed in detail: LDL less than 70, hemoglobin A1c less than 7, blood pressure  target less than 130/80 mmHg, >150 minutes of moderate aerobic activity per week, avoidance of smoking, weight control (via diet and exercise), and continued surveillance/management of/for obstructive sleep apnea.    Medication Adjustments/Labs and Tests Ordered: Current medicines are reviewed at length with the patient today.  Concerns regarding medicines are outlined above.  Orders Placed This Encounter  Procedures  . EKG 12-Lead   Meds ordered this encounter  Medications  . carvedilol (COREG) 6.25 MG tablet    Sig: Take 1 tablet (6.25 mg total) by mouth 2 (two) times daily.    Dispense:  180 tablet    Refill:  3    Dose change    There are no Patient Instructions on file for this visit.   Signed, Sinclair Grooms, MD  09/12/2019 3:16 PM    [facility]

## 2019-09-12 ENCOUNTER — Other Ambulatory Visit: Payer: Self-pay

## 2019-09-12 ENCOUNTER — Encounter: Payer: Self-pay | Admitting: Interventional Cardiology

## 2019-09-12 ENCOUNTER — Ambulatory Visit: Payer: Medicare HMO | Admitting: Interventional Cardiology

## 2019-09-12 VITALS — BP 168/88 | HR 68 | Ht 62.0 in | Wt 119.2 lb

## 2019-09-12 DIAGNOSIS — E785 Hyperlipidemia, unspecified: Secondary | ICD-10-CM

## 2019-09-12 DIAGNOSIS — I10 Essential (primary) hypertension: Secondary | ICD-10-CM

## 2019-09-12 DIAGNOSIS — Z7189 Other specified counseling: Secondary | ICD-10-CM

## 2019-09-12 DIAGNOSIS — Z8249 Family history of ischemic heart disease and other diseases of the circulatory system: Secondary | ICD-10-CM

## 2019-09-12 MED ORDER — CARVEDILOL 6.25 MG PO TABS: 6.2500 mg | ORAL_TABLET | Freq: Two times a day (BID) | ORAL | 3 refills | Status: DC

## 2019-09-12 NOTE — Patient Instructions (Addendum)
Medication Instructions:  1) INCREASE Carvedilol to 6.25mg  twice daily.  Monitor your blood pressure at least once a day for the next couple of weeks and contact us with those readings.  Make sure this is taken at least 2 hours after your dose of Carvedilol.   *If you need a refill on your cardiac medications before your next appointment, please call your pharmacy*  Lab Work: Lipid, Liver, BMET and CBC when you are fasting.  If you have labs (blood work) drawn today and your tests are completely normal, you will receive your results only by: Marland Kitchen MyChart Message (if you have MyChart) OR . A paper copy in the mail If you have any lab test that is abnormal or we need to change your treatment, we will call you to review the results.  Testing/Procedures: None  Follow-Up: At [facility], you and your health needs are our priority.  As part of our continuing mission to provide you with exceptional heart care, we have created designated Provider Care Teams.  These Care Teams include your primary Cardiologist (physician) and Advanced Practice Providers (APPs -  Physician Assistants and Nurse Practitioners) who all work together to provide you with the care you need, when you need it.  Your next appointment:   12 months  The format for your next appointment:   In Person  Provider:   You may see Sinclair Grooms, MD or one of the following Advanced Practice Providers on your designated Care Team:    Truitt Merle, NP  Cecilie Kicks, NP  Kathyrn Drown, NP   Other Instructions

## 2019-09-13 ENCOUNTER — Other Ambulatory Visit: Payer: Medicare HMO | Admitting: *Deleted

## 2019-09-13 DIAGNOSIS — Z8249 Family history of ischemic heart disease and other diseases of the circulatory system: Secondary | ICD-10-CM

## 2019-09-13 DIAGNOSIS — E785 Hyperlipidemia, unspecified: Secondary | ICD-10-CM | POA: Diagnosis not present

## 2019-09-13 DIAGNOSIS — I10 Essential (primary) hypertension: Secondary | ICD-10-CM

## 2019-09-14 LAB — HEPATIC FUNCTION PANEL
ALT: 22 IU/L (ref 0–32)
AST: 26 IU/L (ref 0–40)
Albumin: 4.5 g/dL (ref 3.7–4.7)
Alkaline Phosphatase: 67 IU/L (ref 39–117)
Bilirubin Total: 0.5 mg/dL (ref 0.0–1.2)
Bilirubin, Direct: 0.19 mg/dL (ref 0.00–0.40)
Total Protein: 6.6 g/dL (ref 6.0–8.5)

## 2019-09-14 LAB — CBC
Hematocrit: 35.4 % (ref 34.0–46.6)
Hemoglobin: 11.7 g/dL (ref 11.1–15.9)
MCH: 32.3 pg (ref 26.6–33.0)
MCHC: 33.1 g/dL (ref 31.5–35.7)
MCV: 98 fL — ABNORMAL HIGH (ref 79–97)
Platelets: 291 10*3/uL (ref 150–450)
RBC: 3.62 x10E6/uL — ABNORMAL LOW (ref 3.77–5.28)
RDW: 12.2 % (ref 11.7–15.4)
WBC: 4.8 10*3/uL (ref 3.4–10.8)

## 2019-09-14 LAB — BASIC METABOLIC PANEL
BUN/Creatinine Ratio: 15 (ref 12–28)
BUN: 9 mg/dL (ref 8–27)
CO2: 26 mmol/L (ref 20–29)
Calcium: 9.1 mg/dL (ref 8.7–10.3)
Chloride: 89 mmol/L — ABNORMAL LOW (ref 96–106)
Creatinine, Ser: 0.61 mg/dL (ref 0.57–1.00)
GFR calc Af Amer: 105 mL/min/{1.73_m2} (ref >59)
GFR calc non Af Amer: 91 mL/min/{1.73_m2} (ref >59)
Glucose: 93 mg/dL (ref 65–99)
Potassium: 4 mmol/L (ref 3.5–5.2)
Sodium: 129 mmol/L — ABNORMAL LOW (ref 134–144)

## 2019-09-14 LAB — LIPID PANEL
Chol/HDL Ratio: 1.6 ratio (ref 0.0–4.4)
Cholesterol, Total: 140 mg/dL (ref 100–199)
HDL: 89 mg/dL (ref >39)
LDL Chol Calc (NIH): 41 mg/dL (ref 0–99)
Triglycerides: 39 mg/dL (ref 0–149)
VLDL Cholesterol Cal: 10 mg/dL (ref 5–40)

## 2019-09-17 ENCOUNTER — Telehealth: Payer: Self-pay | Admitting: Interventional Cardiology

## 2019-09-17 ENCOUNTER — Encounter: Payer: Self-pay | Admitting: Family Medicine

## 2019-09-17 DIAGNOSIS — I10 Essential (primary) hypertension: Secondary | ICD-10-CM

## 2019-09-17 DIAGNOSIS — E871 Hypo-osmolality and hyponatremia: Secondary | ICD-10-CM

## 2019-09-17 MED ORDER — CHLORTHALIDONE 25 MG PO TABS: 12.5000 mg | ORAL_TABLET | Freq: Every day | ORAL | 3 refills | Status: DC

## 2019-09-17 NOTE — Telephone Encounter (Signed)
Pt sent a MyChart message asking that results and recommendation be sent through there.  Message sent.

## 2019-09-17 NOTE — Telephone Encounter (Signed)
Left message to call back  

## 2019-09-17 NOTE — Telephone Encounter (Signed)
New message   Patient is returning for lab results. Please call.

## 2019-09-18 NOTE — Telephone Encounter (Signed)
Can we call and pget this patient scheduled.  I believed she already asked for appointment on mychart?

## 2019-09-26 ENCOUNTER — Other Ambulatory Visit: Payer: Self-pay | Admitting: Family Medicine

## 2019-10-08 DIAGNOSIS — Z01419 Encounter for gynecological examination (general) (routine) without abnormal findings: Secondary | ICD-10-CM | POA: Diagnosis not present

## 2019-10-08 DIAGNOSIS — N958 Other specified menopausal and perimenopausal disorders: Secondary | ICD-10-CM | POA: Diagnosis not present

## 2019-10-08 DIAGNOSIS — Z1231 Encounter for screening mammogram for malignant neoplasm of breast: Secondary | ICD-10-CM | POA: Diagnosis not present

## 2019-10-08 DIAGNOSIS — Z6822 Body mass index (BMI) 22.0-22.9, adult: Secondary | ICD-10-CM | POA: Diagnosis not present

## 2019-10-10 ENCOUNTER — Other Ambulatory Visit: Payer: Medicare HMO | Admitting: *Deleted

## 2019-10-10 ENCOUNTER — Other Ambulatory Visit: Payer: Self-pay

## 2019-10-10 DIAGNOSIS — I10 Essential (primary) hypertension: Secondary | ICD-10-CM | POA: Diagnosis not present

## 2019-10-10 DIAGNOSIS — E871 Hypo-osmolality and hyponatremia: Secondary | ICD-10-CM

## 2019-10-10 LAB — BASIC METABOLIC PANEL
BUN/Creatinine Ratio: 13 (ref 12–28)
BUN: 8 mg/dL (ref 8–27)
CO2: 27 mmol/L (ref 20–29)
Calcium: 9.9 mg/dL (ref 8.7–10.3)
Chloride: 94 mmol/L — ABNORMAL LOW (ref 96–106)
Creatinine, Ser: 0.62 mg/dL (ref 0.57–1.00)
GFR calc Af Amer: 104 mL/min/{1.73_m2} (ref >59)
GFR calc non Af Amer: 90 mL/min/{1.73_m2} (ref >59)
Glucose: 101 mg/dL — ABNORMAL HIGH (ref 65–99)
Potassium: 4.3 mmol/L (ref 3.5–5.2)
Sodium: 135 mmol/L (ref 134–144)

## 2019-10-11 ENCOUNTER — Encounter: Payer: Self-pay | Admitting: *Deleted

## 2019-10-11 NOTE — Progress Notes (Signed)
Virtual Visit via Video Note  I connected with patient on 10/12/19 at  1:45 PM EST by audio enabled telemedicine application and verified that I am speaking with the correct person using two identifiers.   THIS ENCOUNTER IS A VIRTUAL VISIT DUE TO COVID-19 - PATIENT WAS NOT SEEN IN THE OFFICE. PATIENT HAS CONSENTED TO VIRTUAL VISIT / TELEMEDICINE VISIT   Location of patient: home  Location of provider: office  I discussed the limitations of evaluation and management by telemedicine and the availability of in person appointments. The patient expressed understanding and agreed to proceed.   Subjective:   Natasha Trujillo is a 73 y.o. female who presents for Medicare Annual (Subsequent) preventive examination.  Walks 4 miles per day. 5 days per week.   Review of Systems:  Home Safety/Smoke Alarms: Feels safe in home. Smoke alarms in place.  Lives w/ husband in 2 story home. No trouble w/ balance or navigating stairs.   Female:    Mammo- pt reports 10/09/19. Dr.Grewall    Dexa scan- pt reports done 10/09/19 w/ Dr.Grewall [facility]-03/28/19. 5 yr recall.    Objective:     Vitals: Unable to assess. This visit is enabled though telemedicine due to Covid 19.   Advanced Directives 10/12/2019 06/16/2018 05/27/2017 01/25/2016 10/28/2015  Does Patient Have a Medical Advance Directive? No No Yes No No  Type of Advance Directive - - [place] - -  Does patient want to make changes to medical advance directive? - - - - No - Patient declined  Copy of [place] in Chart? - - Yes - No - copy requested  Would patient like information on creating a medical advance directive? No - Patient declined Yes (MAU/Ambulatory/Procedural Areas - Information given) - No - patient declined information No - patient declined information    Tobacco Social History   Tobacco Use  Smoking Status Never Smoker  Smokeless Tobacco Never Used     Counseling given: Not Answered    Clinical Intake:     Pain : No/denies pain                 Past Medical History:  Diagnosis Date  . Hyperlipidemia   . Hypertension   . Osteopenia   . Osteoporosis    Past Surgical History:  Procedure Laterality Date  . BLEPHAROPLASTY Bilateral 08/2016   Dr Shelbie Proctor  . KNEE SURGERY Left 09/12/13  . SHOULDER SURGERY Right 01/05/2017   Dr.Caffrey  . TUBAL LIGATION     Family History  Problem Relation Age of Onset  . Dementia Mother   . Hypertension Mother   . Heart disease Mother 85       CABG  . Hypertension Father   . Heart disease Father 50       MI  . Diabetes Brother 16       type 1  . Hypertension Brother   . Heart disease Brother        cabg  . Cancer Maternal Aunt 68       cervical  . Diabetes Other        committed suicide  . Coronary artery disease Other        female 1st degree relative <50/female 1st degree relative <60  . Cervical cancer Other   . Lung cancer Other   . Heart attack Son 80  . Colon cancer Neg Hx   . Esophageal cancer Neg Hx   . Rectal cancer Neg  Hx   . Stomach cancer Neg Hx    Social History   Socioeconomic History  . Marital status: Married    Spouse name: Not on file  . Number of children: Not on file  . Years of education: Not on file  . Highest education level: Not on file  Occupational History  . Occupation: retired    Fish farm manager: RETIRED  Social Needs  . Financial resource strain: Not on file  . Food insecurity    Worry: Not on file    Inability: Not on file  . Transportation needs    Medical: Not on file    Non-medical: Not on file  Tobacco Use  . Smoking status: Never Smoker  . Smokeless tobacco: Never Used  Substance and Sexual Activity  . Alcohol use: Yes    Comment: occasional  . Drug use: No  . Sexual activity: Yes    Partners: Male  Lifestyle  . Physical activity    Days per week: Not on file    Minutes per session: Not on file  . Stress: Not on file  Relationships  . Social  Herbalist on phone: Not on file    Gets together: Not on file    Attends religious service: Not on file    Active member of club or organization: Not on file    Attends meetings of clubs or organizations: Not on file    Relationship status: Not on file  Other Topics Concern  . Not on file  Social History Narrative   Exercise--walk 4 miles 5 days a week    Outpatient Encounter Medications as of 10/12/2019  Medication Sig  . atorvastatin (LIPITOR) 40 MG tablet TAKE 1 TABLET EVERY DAY (DOSE CHANGE)  . buPROPion (WELLBUTRIN XL) 150 MG 24 hr tablet TAKE 1 TABLET EVERY MORNING  , NEED MD APPOINTMENT FOR REFILLS  . Calcium 1500 MG tablet Take 1,500 mg by mouth daily.   . carvedilol (COREG) 6.25 MG tablet Take 1 tablet (6.25 mg total) by mouth 2 (two) times daily.  . chlorthalidone (HYGROTON) 25 MG tablet Take 0.5 tablets (12.5 mg total) by mouth daily.  . Cholecalciferol (VITAMIN D-3 PO) Take 800 Units by mouth daily.  . Multiple Vitamins-Minerals (MULTIVITAMIN PO) Take 1 tablet by mouth daily.  Marland Kitchen olmesartan (BENICAR) 40 MG tablet Take 1 tablet (40 mg total) by mouth daily.  . Omega-3 Fatty Acids (FISH OIL) 1000 MG CAPS Take 1 capsule by mouth daily.    . vitamin E 400 UNIT capsule Take 400 Units by mouth daily.    Facility-Administered Encounter Medications as of 10/12/2019  Medication  . 0.9 %  sodium chloride infusion    Activities of Daily Living In your present state of health, do you have any difficulty performing the following activities: 10/12/2019  Hearing? N  Vision? N  Difficulty concentrating or making decisions? N  Walking or climbing stairs? N  Dressing or bathing? N  Doing errands, shopping? N  Preparing Food and eating ? N  Using the Toilet? N  In the past six months, have you accidently leaked urine? N  Do you have problems with loss of bowel control? N  Managing your Medications? N  Managing your Finances? N  Housekeeping or managing your Housekeeping?  N  Some recent data might be hidden    Patient Care Team: Carollee Herter, Alferd Apa, DO as PCP - General Dian Queen, MD as Consulting Physician (Obstetrics and Gynecology) Memory Argue, MD  as Referring Physician Mincey, Neena Rhymes, MD as Consulting Physician (Ophthalmology) Belva Crome, MD as Consulting Physician (Cardiology)    Assessment:   This is a routine wellness examination for [place]. Physical assessment deferred to PCP.  Exercise Activities and Dietary recommendations Current Exercise Habits: Home exercise routine, Type of exercise: walking, Time (Minutes): 60, Frequency (Times/Week): 5, Weekly Exercise (Minutes/Week): 300, Intensity: Mild, Exercise limited by: None identified Diet (meal preparation, eat out, water intake, caffeinated beverages, dairy products, fruits and vegetables): eating a plant based diet    Goals    . Drink 3 bottles of water per day (pt-stated)    . Walk 60 min 5x/week       Fall Risk Fall Risk  10/12/2019 06/16/2018 01/02/2018 05/27/2017 12/03/2016  Falls in the past year? 0 No No Yes No  Number falls in past yr: - - - 1 -  Injury with Fall? - - - Yes -  Follow up Education provided;Falls prevention discussed - - Education provided;Falls prevention discussed -     Depression Screen PHQ 2/9 Scores 10/12/2019 06/16/2018 01/02/2018 05/27/2017  PHQ - 2 Score 0 0 0 0  Exception Documentation - - - -     Cognitive Function Ad8 score reviewed for issues:  Issues making decisions:no  Less interest in hobbies / activities:no  Repeats questions, stories (family complaining):no  Trouble using ordinary gadgets (microwave, computer, phone):no  Forgets the month or year: no  Mismanaging finances: no  Remembering appts:no  Daily problems with thinking and/or memory:no Ad8 score is=0        Immunization History  Administered Date(s) Administered  . Influenza Whole 07/26/2008, 08/14/2008, 09/22/2009, 07/24/2010  . Influenza, High Dose  Seasonal PF 09/09/2015, 08/23/2017, 08/16/2018  . Influenza,inj,Quad PF,6+ Mos 08/15/2013, 08/30/2014  . Pneumococcal Conjugate-13 06/20/2014  . Pneumococcal Polysaccharide-23 05/10/2013  . Td 09/22/2005, 07/26/2008  . Zoster 06/04/2008  . Zoster Recombinat (Shingrix) 03/09/2017, 08/23/2017   Screening Tests Health Maintenance  Topic Date Due  . MAMMOGRAM  06/23/2018  . TETANUS/TDAP  07/26/2018  . INFLUENZA VACCINE  06/09/2019  . COLONOSCOPY  03/27/2024  . DEXA SCAN  Completed  . Hepatitis C Screening  Completed  . PNA vac Low Risk Adult  Completed       Plan:   See you next year!  Continue to eat heart healthy diet (full of fruits, vegetables, whole grains, lean protein, water--limit salt, fat, and sugar intake) and increase physical activity as tolerated.  Continue doing brain stimulating activities (puzzles, reading, adult coloring books, staying active) to keep memory sharp.   Bring a copy of your living will and/or healthcare power of attorney to your next office visit.   I have personally reviewed and noted the following in the patient's chart:   . Medical and social history . Use of alcohol, tobacco or illicit drugs  . Current medications and supplements . Functional ability and status . Nutritional status . Physical activity . Advanced directives . List of other physicians . Hospitalizations, surgeries, and ER visits in previous 12 months . Vitals . Screenings to include cognitive, depression, and falls . Referrals and appointments  In addition, I have reviewed and discussed with patient certain preventive protocols, quality metrics, and best practice recommendations. A written personalized care plan for preventive services as well as general preventive health recommendations were provided to patient.     Shela Nevin, [place]  10/12/2019

## 2019-10-11 NOTE — Telephone Encounter (Signed)
These are generally good.

## 2019-10-12 ENCOUNTER — Other Ambulatory Visit: Payer: Self-pay

## 2019-10-12 ENCOUNTER — Ambulatory Visit (INDEPENDENT_AMBULATORY_CARE_PROVIDER_SITE_OTHER): Payer: Medicare HMO | Admitting: *Deleted

## 2019-10-12 ENCOUNTER — Encounter: Payer: Self-pay | Admitting: *Deleted

## 2019-10-12 DIAGNOSIS — Z0000 Encounter for general adult medical examination without abnormal findings: Secondary | ICD-10-CM

## 2019-10-12 NOTE — Patient Instructions (Signed)
See you next year!  Continue to eat heart healthy diet (full of fruits, vegetables, whole grains, lean protein, water--limit salt, fat, and sugar intake) and increase physical activity as tolerated.  Continue doing brain stimulating activities (puzzles, reading, adult coloring books, staying active) to keep memory sharp.   Bring a copy of your living will and/or healthcare power of attorney to your next office visit.   Natasha Trujillo , Thank you for taking time to come for your Medicare Wellness Visit. I appreciate your ongoing commitment to your health goals. Please review the following plan we discussed and let me know if I can assist you in the future.   These are the goals we discussed: Goals    . Drink 3 bottles of water per day (pt-stated)    . Walk 60 min 5x/week       This is a list of the screening recommended for you and due dates:  Health Maintenance  Topic Date Due  . Mammogram  06/23/2018  . Tetanus Vaccine  07/26/2018  . Flu Shot  06/09/2019  . Colon Cancer Screening  03/27/2024  . DEXA scan (bone density measurement)  Completed  .  Hepatitis C: One time screening is recommended by Center for Disease Control  (CDC) for  adults born from [birth year] through 1964.   Completed  . Pneumonia vaccines  Completed    Preventive Care 27 Years and Older, Female Preventive care refers to lifestyle choices and visits with your health care provider that can promote health and wellness. This includes:  A yearly physical exam. This is also called an annual well check.  Regular dental and eye exams.  Immunizations.  Screening for certain conditions.  Healthy lifestyle choices, such as diet and exercise. What can I expect for my preventive care visit? Physical exam Your health care provider will check:  Height and weight. These may be used to calculate body mass index (BMI), which is a measurement that tells if you are at a healthy weight.  Heart rate and blood pressure.   Your skin for abnormal spots. Counseling Your health care provider may ask you questions about:  Alcohol, tobacco, and drug use.  Emotional well-being.  Home and relationship well-being.  Sexual activity.  Eating habits.  History of falls.  Memory and ability to understand (cognition).  Work and work Statistician.  Pregnancy and menstrual history. What immunizations do I need?  Influenza (flu) vaccine  This is recommended every year. Tetanus, diphtheria, and pertussis (Tdap) vaccine  You may need a Td booster every 10 years. Varicella (chickenpox) vaccine  You may need this vaccine if you have not already been vaccinated. Zoster (shingles) vaccine  You may need this after age 66. Pneumococcal conjugate (PCV13) vaccine  One dose is recommended after age 69. Pneumococcal polysaccharide (PPSV23) vaccine  One dose is recommended after age 72. Measles, mumps, and rubella (MMR) vaccine  You may need at least one dose of MMR if you were born in 1957 or later. You may also need a second dose. Meningococcal conjugate (MenACWY) vaccine  You may need this if you have certain conditions. Hepatitis A vaccine  You may need this if you have certain conditions or if you travel or work in places where you may be exposed to hepatitis A. Hepatitis B vaccine  You may need this if you have certain conditions or if you travel or work in places where you may be exposed to hepatitis B. Haemophilus influenzae type b (Hib) vaccine  You may need this if you have certain conditions. You may receive vaccines as individual doses or as more than one vaccine together in one shot (combination vaccines). Talk with your health care provider about the risks and benefits of combination vaccines. What tests do I need? Blood tests  Lipid and cholesterol levels. These may be checked every 5 years, or more frequently depending on your overall health.  Hepatitis C test.  Hepatitis B test.  Screening  Lung cancer screening. You may have this screening every year starting at age 34 if you have a 30-pack-year history of smoking and currently smoke or have quit within the past 15 years.  Colorectal cancer screening. All adults should have this screening starting at age 32 and continuing until age 51. Your health care provider may recommend screening at age 80 if you are at increased risk. You will have tests every 1-10 years, depending on your results and the type of screening test.  Diabetes screening. This is done by checking your blood sugar (glucose) after you have not eaten for a while (fasting). You may have this done every 1-3 years.  Mammogram. This may be done every 1-2 years. Talk with your health care provider about how often you should have regular mammograms.  BRCA-related cancer screening. This may be done if you have a family history of breast, ovarian, tubal, or peritoneal cancers. Other tests  Sexually transmitted disease (STD) testing.  Bone density scan. This is done to screen for osteoporosis. You may have this done starting at age 41. Follow these instructions at home: Eating and drinking  Eat a diet that includes fresh fruits and vegetables, whole grains, lean protein, and low-fat dairy products. Limit your intake of foods with high amounts of sugar, saturated fats, and salt.  Take vitamin and mineral supplements as recommended by your health care provider.  Do not drink alcohol if your health care provider tells you not to drink.  If you drink alcohol: ? Limit how much you have to 0-1 drink a day. ? Be aware of how much alcohol is in your drink. In the [place] one drink equals one 12 oz bottle of beer (355 mL), one 5 oz glass of wine (148 mL), or one 1 oz glass of hard liquor (44 mL). Lifestyle  Take daily care of your teeth and gums.  Stay active. Exercise for at least 30 minutes on 5 or more days each week.  Do not use any products that contain  nicotine or tobacco, such as cigarettes, e-cigarettes, and chewing tobacco. If you need help quitting, ask your health care provider.  If you are sexually active, practice safe sex. Use a condom or other form of protection in order to prevent STIs (sexually transmitted infections).  Talk with your health care provider about taking a low-dose aspirin or statin. What's next?  Go to your health care provider once a year for a well check visit.  Ask your health care provider how often you should have your eyes and teeth checked.  Stay up to date on all vaccines. This information is not intended to replace advice given to you by your health care provider. Make sure you discuss any questions you have with your health care provider. Document Released: 11/21/2015 Document Revised: 10/19/2018 Document Reviewed: 10/19/2018 Elsevier Patient Education  2020 [employer].

## 2019-10-16 DIAGNOSIS — H25813 Combined forms of age-related cataract, bilateral: Secondary | ICD-10-CM | POA: Diagnosis not present

## 2019-10-16 DIAGNOSIS — H04123 Dry eye syndrome of bilateral lacrimal glands: Secondary | ICD-10-CM | POA: Diagnosis not present

## 2019-10-16 DIAGNOSIS — H02813 Retained foreign body in right eye, unspecified eyelid: Secondary | ICD-10-CM | POA: Diagnosis not present

## 2019-10-23 ENCOUNTER — Encounter: Payer: Self-pay | Admitting: Family Medicine

## 2019-10-23 NOTE — Telephone Encounter (Signed)
I sent a message to someone that she could just stop it because she is only on 150mg  --- not sure what happened to it

## 2019-11-18 ENCOUNTER — Other Ambulatory Visit: Payer: Self-pay | Admitting: Interventional Cardiology

## 2019-11-22 ENCOUNTER — Other Ambulatory Visit: Payer: Self-pay

## 2019-11-22 NOTE — Telephone Encounter (Signed)
Pt called back and rescheduled for 12/18/2019 at 2:40pm. Pt asking if she can come in the week before for lab draw. Please advise.

## 2019-11-23 ENCOUNTER — Encounter: Payer: Medicare HMO | Admitting: Family Medicine

## 2019-11-23 ENCOUNTER — Other Ambulatory Visit: Payer: Self-pay | Admitting: Family Medicine

## 2019-11-23 DIAGNOSIS — I10 Essential (primary) hypertension: Secondary | ICD-10-CM

## 2019-11-23 DIAGNOSIS — E785 Hyperlipidemia, unspecified: Secondary | ICD-10-CM

## 2019-11-23 NOTE — Telephone Encounter (Signed)
Order placed

## 2019-12-12 ENCOUNTER — Encounter: Payer: Self-pay | Admitting: Family Medicine

## 2019-12-13 NOTE — Telephone Encounter (Signed)
Orders already in.

## 2019-12-14 ENCOUNTER — Encounter: Payer: Self-pay | Admitting: Family Medicine

## 2019-12-17 ENCOUNTER — Other Ambulatory Visit: Payer: Self-pay

## 2019-12-18 ENCOUNTER — Encounter: Payer: Self-pay | Admitting: Family Medicine

## 2019-12-18 ENCOUNTER — Other Ambulatory Visit: Payer: Self-pay

## 2019-12-18 ENCOUNTER — Ambulatory Visit (INDEPENDENT_AMBULATORY_CARE_PROVIDER_SITE_OTHER): Payer: Medicare HMO | Admitting: Family Medicine

## 2019-12-18 VITALS — BP 144/80 | HR 60 | Temp 97.6°F | Resp 18 | Ht 62.0 in | Wt 120.0 lb

## 2019-12-18 DIAGNOSIS — Z23 Encounter for immunization: Secondary | ICD-10-CM | POA: Diagnosis not present

## 2019-12-18 DIAGNOSIS — W57XXXA Bitten or stung by nonvenomous insect and other nonvenomous arthropods, initial encounter: Secondary | ICD-10-CM | POA: Diagnosis not present

## 2019-12-18 DIAGNOSIS — I10 Essential (primary) hypertension: Secondary | ICD-10-CM | POA: Diagnosis not present

## 2019-12-18 DIAGNOSIS — E785 Hyperlipidemia, unspecified: Secondary | ICD-10-CM

## 2019-12-18 DIAGNOSIS — S80869A Insect bite (nonvenomous), unspecified lower leg, initial encounter: Secondary | ICD-10-CM | POA: Diagnosis not present

## 2019-12-18 NOTE — Progress Notes (Signed)
Subjective:     Natasha Trujillo is a 74 y.o. female and is here for a comprehensive physical exam. The patient reports no problems.  Social History   Socioeconomic History  . Marital status: Married    Spouse name: Not on file  . Number of children: Not on file  . Years of education: Not on file  . Highest education level: Not on file  Occupational History  . Occupation: retired    Fish farm manager: RETIRED  Tobacco Use  . Smoking status: Never Smoker  . Smokeless tobacco: Never Used  Substance and Sexual Activity  . Alcohol use: Yes    Comment: occasional  . Drug use: No  . Sexual activity: Yes    Partners: Male  Other Topics Concern  . Not on file  Social History Narrative   Exercise--walk 4 miles 5 days a week   Social Determinants of Health   Financial Resource Strain:   . Difficulty of Paying Living Expenses: Not on file  Food Insecurity:   . Worried About Charity fundraiser in the Last Year: Not on file  . Ran Out of Food in the Last Year: Not on file  Transportation Needs:   . Lack of Transportation (Medical): Not on file  . Lack of Transportation (Non-Medical): Not on file  Physical Activity:   . Days of Exercise per Week: Not on file  . Minutes of Exercise per Session: Not on file  Stress:   . Feeling of Stress : Not on file  Social Connections:   . Frequency of Communication with Friends and Family: Not on file  . Frequency of Social Gatherings with Friends and Family: Not on file  . Attends Religious Services: Not on file  . Active Member of Clubs or Organizations: Not on file  . Attends Archivist Meetings: Not on file  . Marital Status: Not on file  Intimate Partner Violence:   . Fear of Current or Ex-Partner: Not on file  . Emotionally Abused: Not on file  . Physically Abused: Not on file  . Sexually Abused: Not on file   Health Maintenance  Topic Date Due  . TETANUS/TDAP  07/26/2018  . MAMMOGRAM  08/08/2020  . COLONOSCOPY  03/27/2024   . INFLUENZA VACCINE  Completed  . DEXA SCAN  Completed  . Hepatitis C Screening  Completed  . PNA vac Low Risk Adult  Completed    The following portions of the patient's history were reviewed and updated as appropriate:  She  has a past medical history of Hyperlipidemia, Hypertension, Osteopenia, and Osteoporosis. She does not have any pertinent problems on file. She  has a past surgical history that includes Tubal ligation; Knee surgery (Left, 09/12/13); Blepharoplasty (Bilateral, 08/2016); and Shoulder surgery (Right, 01/05/2017). Her family history includes Cancer (age of onset: 41) in her maternal aunt; Cervical cancer in an other family member; Coronary artery disease in an other family member; Dementia in her mother; Diabetes in an other family member; Diabetes (age of onset: 36) in her brother; Heart attack (age of onset: 58) in her son; Heart disease in her brother; Heart disease (age of onset: 50) in her father and mother; Hypertension in her brother, father, and mother; Lung cancer in an other family member. She  reports that she has never smoked. She has never used smokeless tobacco. She reports current alcohol use. She reports that she does not use drugs. She has a current medication list which includes the following prescription(s): atorvastatin,  calcium, carvedilol, chlorthalidone, cholecalciferol, multiple vitamin, olmesartan, fish oil, and vitamin e. Current Outpatient Medications on File Prior to Visit  Medication Sig Dispense Refill  . atorvastatin (LIPITOR) 40 MG tablet TAKE 1 TABLET EVERY DAY (DOSE CHANGE) 90 tablet 0  . Calcium 1500 MG tablet Take 1,500 mg by mouth daily.     . carvedilol (COREG) 6.25 MG tablet Take 1 tablet (6.25 mg total) by mouth 2 (two) times daily. 180 tablet 3  . chlorthalidone (HYGROTON) 25 MG tablet Take 0.5 tablets (12.5 mg total) by mouth daily. 45 tablet 3  . Cholecalciferol (VITAMIN D-3 PO) Take 800 Units by mouth daily.    . Multiple  Vitamins-Minerals (MULTIVITAMIN PO) Take 1 tablet by mouth daily.    Marland Kitchen olmesartan (BENICAR) 40 MG tablet TAKE 1 TABLET EVERY DAY 90 tablet 3  . Omega-3 Fatty Acids (FISH OIL) 1000 MG CAPS Take 1 capsule by mouth daily.      . vitamin E 400 UNIT capsule Take 400 Units by mouth daily.      No current facility-administered medications on file prior to visit.   She is allergic to lisinopril and adrenalin [epinephrine]..  Review of Systems Review of Systems  Constitutional: Negative for activity change, appetite change and fatigue.  HENT: Negative for hearing loss, congestion, tinnitus and ear discharge.  dentist q22m Eyes: Negative for visual disturbance (see optho q1y -- vision corrected to 20/20 with glasses).  Respiratory: Negative for cough, chest tightness and shortness of breath.   Cardiovascular: Negative for chest pain, palpitations and leg swelling.  Gastrointestinal: Negative for abdominal pain, diarrhea, constipation and abdominal distention.  Genitourinary: Negative for urgency, frequency, decreased urine volume and difficulty urinating.  Musculoskeletal: Negative for back pain, arthralgias and gait problem.  Skin: Negative for color change, pallor and rash.  Neurological: Negative for dizziness, light-headedness, numbness and headaches.  Hematological: Negative for adenopathy. Does not bruise/bleed easily.  Psychiatric/Behavioral: Negative for suicidal ideas, confusion, sleep disturbance, self-injury, dysphoric mood, decreased concentration and agitation.       Objective:    BP (!) 144/80 (BP Location: Left Arm, Patient Position: Sitting, Cuff Size: Normal)   Pulse 60   Temp 97.6 F (36.4 C) (Temporal)   Resp 18   Ht 5\' 2"  (1.575 m)   Wt 120 lb (54.4 kg)   SpO2 100%   BMI 21.95 kg/m  General appearance: alert, cooperative, appears stated age and no distress Head: Normocephalic, without obvious abnormality, atraumatic Eyes: negative findings: lids and lashes normal,  conjunctivae and sclerae normal and pupils equal, round, reactive to light and accomodation Ears: normal TM's and external ear canals both ears Neck: no adenopathy, no carotid bruit, no JVD, supple, symmetrical, trachea midline and thyroid not enlarged, symmetric, no tenderness/mass/nodules Back: symmetric, no curvature. ROM normal. No CVA tenderness. Lungs: clear to auscultation bilaterally Breasts: gyn Heart: regular rate and rhythm, S1, S2 normal, no murmur, click, rub or gallop Abdomen: soft, non-tender; bowel sounds normal; no masses,  no organomegaly Pelvic: deferred--gyn Extremities: extremities normal, atraumatic, no cyanosis or edema Pulses: 2+ and symmetric Skin: Skin color, texture, turgor normal. No rashes or lesions Lymph nodes: Cervical, supraclavicular, and axillary nodes normal. Neurologic: Alert and oriented X 3, normal strength and tone. Normal symmetric reflexes. Normal coordination and gait    Assessment:    Healthy female exam.      Plan:    ghm utd Check labs  See After Visit Summary for Counseling Recommendations    1. Essential hypertension Well controlled,  no changes to meds. Encouraged heart healthy diet such as the DASH diet and exercise as tolerated.  - Lipid panel; Future - Comprehensive metabolic panel; Future  2. Hyperlipidemia, unspecified hyperlipidemia type Encouraged heart healthy diet, increase exercise, avoid trans fats, consider a krill oil cap daily - Lipid panel; Future - Comprehensive metabolic panel; Future  3. Bug bite, initial encounter On leg--- no  Signs of infection  - Td vaccine greater than or equal to 7yo preservative free IM

## 2019-12-18 NOTE — Patient Instructions (Signed)
Preventive Care 38 Years and Older, Female Preventive care refers to lifestyle choices and visits with your health care provider that can promote health and wellness. This includes:  A yearly physical exam. This is also called an annual well check.  Regular dental and eye exams.  Immunizations.  Screening for certain conditions.  Healthy lifestyle choices, such as diet and exercise. What can I expect for my preventive care visit? Physical exam Your health care provider will check:  Height and weight. These may be used to calculate body mass index (BMI), which is a measurement that tells if you are at a healthy weight.  Heart rate and blood pressure.  Your skin for abnormal spots. Counseling Your health care provider may ask you questions about:  Alcohol, tobacco, and drug use.  Emotional well-being.  Home and relationship well-being.  Sexual activity.  Eating habits.  History of falls.  Memory and ability to understand (cognition).  Work and work Statistician.  Pregnancy and menstrual history. What immunizations do I need?  Influenza (flu) vaccine  This is recommended every year. Tetanus, diphtheria, and pertussis (Tdap) vaccine  You may need a Td booster every 10 years. Varicella (chickenpox) vaccine  You may need this vaccine if you have not already been vaccinated. Zoster (shingles) vaccine  You may need this after age 33. Pneumococcal conjugate (PCV13) vaccine  One dose is recommended after age 33. Pneumococcal polysaccharide (PPSV23) vaccine  One dose is recommended after age 72. Measles, mumps, and rubella (MMR) vaccine  You may need at least one dose of MMR if you were born in 1957 or later. You may also need a second dose. Meningococcal conjugate (MenACWY) vaccine  You may need this if you have certain conditions. Hepatitis A vaccine  You may need this if you have certain conditions or if you travel or work in places where you may be exposed  to hepatitis A. Hepatitis B vaccine  You may need this if you have certain conditions or if you travel or work in places where you may be exposed to hepatitis B. Haemophilus influenzae type b (Hib) vaccine  You may need this if you have certain conditions. You may receive vaccines as individual doses or as more than one vaccine together in one shot (combination vaccines). Talk with your health care provider about the risks and benefits of combination vaccines. What tests do I need? Blood tests  Lipid and cholesterol levels. These may be checked every 5 years, or more frequently depending on your overall health.  Hepatitis C test.  Hepatitis B test. Screening  Lung cancer screening. You may have this screening every year starting at age 39 if you have a 30-pack-year history of smoking and currently smoke or have quit within the past 15 years.  Colorectal cancer screening. All adults should have this screening starting at age 36 and continuing until age 15. Your health care provider may recommend screening at age 23 if you are at increased risk. You will have tests every 1-10 years, depending on your results and the type of screening test.  Diabetes screening. This is done by checking your blood sugar (glucose) after you have not eaten for a while (fasting). You may have this done every 1-3 years.  Mammogram. This may be done every 1-2 years. Talk with your health care provider about how often you should have regular mammograms.  BRCA-related cancer screening. This may be done if you have a family history of breast, ovarian, tubal, or peritoneal cancers.  Other tests  Sexually transmitted disease (STD) testing.  Bone density scan. This is done to screen for osteoporosis. You may have this done starting at age 44. Follow these instructions at home: Eating and drinking  Eat a diet that includes fresh fruits and vegetables, whole grains, lean protein, and low-fat dairy products. Limit  your intake of foods with high amounts of sugar, saturated fats, and salt.  Take vitamin and mineral supplements as recommended by your health care provider.  Do not drink alcohol if your health care provider tells you not to drink.  If you drink alcohol: ? Limit how much you have to 0-1 drink a day. ? Be aware of how much alcohol is in your drink. In the [place] one drink equals one 12 oz bottle of beer (355 mL), one 5 oz glass of wine (148 mL), or one 1 oz glass of hard liquor (44 mL). Lifestyle  Take daily care of your teeth and gums.  Stay active. Exercise for at least 30 minutes on 5 or more days each week.  Do not use any products that contain nicotine or tobacco, such as cigarettes, e-cigarettes, and chewing tobacco. If you need help quitting, ask your health care provider.  If you are sexually active, practice safe sex. Use a condom or other form of protection in order to prevent STIs (sexually transmitted infections).  Talk with your health care provider about taking a low-dose aspirin or statin. What's next?  Go to your health care provider once a year for a well check visit.  Ask your health care provider how often you should have your eyes and teeth checked.  Stay up to date on all vaccines. This information is not intended to replace advice given to you by your health care provider. Make sure you discuss any questions you have with your health care provider. Document Revised: 10/19/2018 Document Reviewed: 10/19/2018 Elsevier Patient Education  2020 [employer].

## 2019-12-20 ENCOUNTER — Other Ambulatory Visit: Payer: Self-pay

## 2019-12-20 ENCOUNTER — Other Ambulatory Visit (INDEPENDENT_AMBULATORY_CARE_PROVIDER_SITE_OTHER): Payer: Medicare HMO

## 2019-12-20 DIAGNOSIS — E785 Hyperlipidemia, unspecified: Secondary | ICD-10-CM

## 2019-12-20 DIAGNOSIS — I10 Essential (primary) hypertension: Secondary | ICD-10-CM | POA: Diagnosis not present

## 2019-12-20 LAB — COMPREHENSIVE METABOLIC PANEL
ALT: 19 U/L (ref 0–35)
AST: 21 U/L (ref 0–37)
Albumin: 4.3 g/dL (ref 3.5–5.2)
Alkaline Phosphatase: 61 U/L (ref 39–117)
BUN: 7 mg/dL (ref 6–23)
CO2: 30 mEq/L (ref 19–32)
Calcium: 9.1 mg/dL (ref 8.4–10.5)
Chloride: 95 mEq/L — ABNORMAL LOW (ref 96–112)
Creatinine, Ser: 0.59 mg/dL (ref 0.40–1.20)
GFR: 100.06 mL/min (ref >60.00)
Glucose, Bld: 93 mg/dL (ref 70–99)
Potassium: 4 mEq/L (ref 3.5–5.1)
Sodium: 131 mEq/L — ABNORMAL LOW (ref 135–145)
Total Bilirubin: 0.6 mg/dL (ref 0.2–1.2)
Total Protein: 6.5 g/dL (ref 6.0–8.3)

## 2019-12-20 LAB — LIPID PANEL
Cholesterol: 145 mg/dL (ref 0–200)
HDL: 79.3 mg/dL (ref >39.00)
LDL Cholesterol: 58 mg/dL (ref 0–99)
NonHDL: 65.31
Total CHOL/HDL Ratio: 2
Triglycerides: 39 mg/dL (ref 0.0–149.0)
VLDL: 7.8 mg/dL (ref 0.0–40.0)

## 2019-12-23 ENCOUNTER — Other Ambulatory Visit: Payer: Self-pay | Admitting: Family Medicine

## 2019-12-23 DIAGNOSIS — E871 Hypo-osmolality and hyponatremia: Secondary | ICD-10-CM

## 2020-01-03 ENCOUNTER — Encounter: Payer: Self-pay | Admitting: Family Medicine

## 2020-01-30 DIAGNOSIS — H6123 Impacted cerumen, bilateral: Secondary | ICD-10-CM | POA: Diagnosis not present

## 2020-01-30 DIAGNOSIS — Z974 Presence of external hearing-aid: Secondary | ICD-10-CM | POA: Diagnosis not present

## 2020-02-12 ENCOUNTER — Encounter: Payer: Self-pay | Admitting: Family Medicine

## 2020-02-12 DIAGNOSIS — Z1283 Encounter for screening for malignant neoplasm of skin: Secondary | ICD-10-CM | POA: Diagnosis not present

## 2020-02-12 MED ORDER — ATORVASTATIN CALCIUM 40 MG PO TABS: 40.0000 mg | ORAL_TABLET | Freq: Every day | ORAL | 3 refills | Status: DC

## 2020-03-10 ENCOUNTER — Encounter: Payer: Self-pay | Admitting: Family Medicine

## 2020-03-11 ENCOUNTER — Telehealth: Payer: Self-pay | Admitting: Family Medicine

## 2020-03-11 ENCOUNTER — Other Ambulatory Visit: Payer: Self-pay

## 2020-03-11 ENCOUNTER — Other Ambulatory Visit (INDEPENDENT_AMBULATORY_CARE_PROVIDER_SITE_OTHER): Payer: Medicare HMO

## 2020-03-11 DIAGNOSIS — E871 Hypo-osmolality and hyponatremia: Secondary | ICD-10-CM | POA: Diagnosis not present

## 2020-03-11 LAB — LIPID PANEL
Cholesterol: 142 mg/dL (ref 0–200)
HDL: 69.6 mg/dL (ref >39.00)
LDL Cholesterol: 63 mg/dL (ref 0–99)
NonHDL: 72.88
Total CHOL/HDL Ratio: 2
Triglycerides: 51 mg/dL (ref 0.0–149.0)
VLDL: 10.2 mg/dL (ref 0.0–40.0)

## 2020-03-11 LAB — COMPREHENSIVE METABOLIC PANEL
ALT: 15 U/L (ref 0–35)
AST: 18 U/L (ref 0–37)
Albumin: 4.4 g/dL (ref 3.5–5.2)
Alkaline Phosphatase: 52 U/L (ref 39–117)
BUN: 8 mg/dL (ref 6–23)
CO2: 34 mEq/L — ABNORMAL HIGH (ref 19–32)
Calcium: 9 mg/dL (ref 8.4–10.5)
Chloride: 96 mEq/L (ref 96–112)
Creatinine, Ser: 0.61 mg/dL (ref 0.40–1.20)
GFR: 96.23 mL/min (ref >60.00)
Glucose, Bld: 92 mg/dL (ref 70–99)
Potassium: 4 mEq/L (ref 3.5–5.1)
Sodium: 134 mEq/L — ABNORMAL LOW (ref 135–145)
Total Bilirubin: 0.5 mg/dL (ref 0.2–1.2)
Total Protein: 6.3 g/dL (ref 6.0–8.3)

## 2020-03-11 NOTE — Telephone Encounter (Signed)
recieved

## 2020-03-11 NOTE — Telephone Encounter (Signed)
Pt dropped off copy of Covid Vaccination for provider to have on pt's chart. Document put at front office tray under providers name.

## 2020-03-11 NOTE — Telephone Encounter (Signed)
Documented

## 2020-05-19 ENCOUNTER — Encounter: Payer: Self-pay | Admitting: Family Medicine

## 2020-05-19 MED ORDER — ATORVASTATIN CALCIUM 40 MG PO TABS: 40.0000 mg | ORAL_TABLET | Freq: Every day | ORAL | 3 refills | Status: DC

## 2020-05-19 MED ORDER — CARVEDILOL 6.25 MG PO TABS: 6.2500 mg | ORAL_TABLET | Freq: Two times a day (BID) | ORAL | 3 refills | Status: DC

## 2020-10-13 ENCOUNTER — Ambulatory Visit: Payer: Self-pay | Admitting: *Deleted

## 2020-10-14 DIAGNOSIS — Z124 Encounter for screening for malignant neoplasm of cervix: Secondary | ICD-10-CM | POA: Diagnosis not present

## 2020-10-14 DIAGNOSIS — Z6822 Body mass index (BMI) 22.0-22.9, adult: Secondary | ICD-10-CM | POA: Diagnosis not present

## 2020-10-15 NOTE — Progress Notes (Signed)
Cardiology Office Note:    Date:  10/16/2020   ID:  [name], DOB 19-Jul-1946, MRN [id number]  PCP:  Ann Held, DO  Cardiologist:  No primary care provider on file.   Referring MD: Carollee Herter, Alferd Apa, *   Chief Complaint  Patient presents with  . Hyperlipidemia  . Hypertension  . Coronary Artery Disease    History of Present Illness:    Natasha Trujillo is a 74 y.o. female with a hx of  essential hypertension, family h/o premature CAD, and primary hypertension.  She has no complaints.  She is diligent about cardiovascular preventive measures.  She walks an hour 6 days a week.  She is mindful of her caloric intake, has migrated to a near complete plant-based diet, she is not mindful of sodium, she does not drink alcohol regularly, gets greater than 6 hours of sleep, and has no history of snoring or sleep apnea type complaints.  With physical activity she is not suffering any chest discomfort or dyspnea.  She is not having palpitations or other complaints.  Past Medical History:  Diagnosis Date  . Hyperlipidemia   . Hypertension   . Osteopenia   . Osteoporosis     Past Surgical History:  Procedure Laterality Date  . BLEPHAROPLASTY Bilateral 08/2016   Dr Shelbie Proctor  . KNEE SURGERY Left 09/12/13  . SHOULDER SURGERY Right 01/05/2017   Dr.Caffrey  . TUBAL LIGATION      Current Medications: Current Meds  Medication Sig  . atorvastatin (LIPITOR) 40 MG tablet Take 1 tablet (40 mg total) by mouth daily.  . chlorthalidone (HYGROTON) 25 MG tablet Take 0.5 tablets (12.5 mg total) by mouth daily.  Marland Kitchen olmesartan (BENICAR) 40 MG tablet TAKE 1 TABLET EVERY DAY  . [DISCONTINUED] carvedilol (COREG) 6.25 MG tablet Take 1 tablet (6.25 mg total) by mouth 2 (two) times daily.     Allergies:   Lisinopril and Adrenalin [epinephrine]   Social History   Socioeconomic History  . Marital status: Married    Spouse name: Not on file  . Number of children: Not  on file  . Years of education: Not on file  . Highest education level: Not on file  Occupational History  . Occupation: retired    Fish farm manager: RETIRED  Tobacco Use  . Smoking status: Never Smoker  . Smokeless tobacco: Never Used  Vaping Use  . Vaping Use: Never used  Substance and Sexual Activity  . Alcohol use: Yes    Comment: occasional  . Drug use: No  . Sexual activity: Yes    Partners: Male  Other Topics Concern  . Not on file  Social History Narrative   Exercise--walk 4 miles 5 days a week   Social Determinants of Radio broadcast assistant Strain: Not on file  Food Insecurity: Not on file  Transportation Needs: Not on file  Physical Activity: Not on file  Stress: Not on file  Social Connections: Not on file     Family History: The patient's family history includes Cancer (age of onset: 52) in her maternal aunt; Cervical cancer in an other family member; Coronary artery disease in an other family member; Dementia in her mother; Diabetes in an other family member; Diabetes (age of onset: 50) in her brother; Heart attack (age of onset: 73) in her son; Heart disease in her brother; Heart disease (age of onset: 74) in her father and mother; Hypertension in her brother, father, and mother; Lung cancer  in an other family member. There is no history of Colon cancer, Esophageal cancer, Rectal cancer, or Stomach cancer.  ROS:   Please see the history of present illness.    No complaints All other systems reviewed and are negative.  EKGs/Labs/Other Studies Reviewed:    The following studies were reviewed today: 2D Doppler echocardiogram April 2019: Study Conclusions   - Left ventricle: The cavity size was normal. Systolic function was  normal. The estimated ejection fraction was in the range of 55%  to 60%. Wall motion was normal; there were no regional wall  motion abnormalities. Doppler parameters are consistent with  abnormal left ventricular relaxation (grade 1  diastolic  dysfunction). There was no evidence of elevated ventricular  filling pressure by Doppler parameters.  - Aortic valve: There was no regurgitation.  - Mitral valve: There was mild regurgitation.  - Right ventricle: The cavity size was normal. Wall thickness was  normal. Systolic function was normal.  - Right atrium: The atrium was normal in size.  - Tricuspid valve: There was mild regurgitation.  - Pulmonary arteries: Systolic pressure was mildly increased. PA  peak pressure: 38 mm Hg (S).  - Inferior vena cava: The vessel was normal in size.  - Pericardium, extracardiac: There was no pericardial effusion.   Exercise treadmill test April 2019: Stress Measurements  Baseline Vitals  Rest HR 71 bpm    Rest BP 150/68 mmHg    Exercise Time  Exercise duration (min) 9 min    Exercise duration (sec) 0 sec    Peak Stress Vitals  Peak HR 131 bpm    Peak BP 177/88 mmHg    Exercise Data  MPHR 150 bpm    Percent HR 87 %    RPE 15     Estimated workload 10.1 METS          EKG:  EKG reveals normal sinus rhythm/sinus bradycardia with normal-appearing EKG  Recent Labs: 03/11/2020: ALT 15; BUN 8; Creatinine, Ser 0.61; Potassium 4.0; Sodium 134  Recent Lipid Panel    Component Value Date/Time   CHOL 142 03/11/2020 0934   CHOL 140 09/13/2019 1001   TRIG 51.0 03/11/2020 0934   HDL 69.60 03/11/2020 0934   HDL 89 09/13/2019 1001   CHOLHDL 2 03/11/2020 0934   VLDL 10.2 03/11/2020 0934   LDLCALC 63 03/11/2020 0934   LDLCALC 41 09/13/2019 1001    Physical Exam:    VS:  BP (!) 160/88   Pulse (!) 59   Ht 5\' 2"  (1.575 m)   Wt 118 lb (53.5 kg)   SpO2 99%   BMI 21.58 kg/m     Wt Readings from Last 3 Encounters:  10/16/20 118 lb (53.5 kg)  12/18/19 120 lb (54.4 kg)  09/12/19 119 lb 3.2 oz (54.1 kg)     GEN: Healthy-appearing. No acute distress HEENT: Normal NECK: No JVD. LYMPHATICS: No lymphadenopathy CARDIAC:  RRR without murmur, gallop, or  edema. VASCULAR:  Normal Pulses. No bruits. RESPIRATORY:  Clear to auscultation without rales, wheezing or rhonchi  ABDOMEN: Soft, non-tender, non-distended, No pulsatile mass, MUSCULOSKELETAL: No deformity  SKIN: Warm and dry NEUROLOGIC:  Alert and oriented x 3 PSYCHIATRIC:  Normal affect   ASSESSMENT:    1. Essential hypertension   2. Family history of early CAD   3. Hyperlipidemia LDL goal <100   4. Hyponatremia   5. Educated about COVID-19 virus infection    PLAN:    In order of problems listed above:  1.  Systolic hypertension.  Further increase carvedilol to 12.5 mg twice daily.  We are attempting to achieve a target of 130/80 mmHg.  If the increase in carvedilol does not get [place] to target, consider adding menorrhalgia corticoid receptor antagonist, or amlodipine.  If carvedilol increases not tolerated, add amlodipine and maintain carvedilol at 6.25 mg.  Monitor blood pressure 3 times per week and notify us if we did not hit target. 2. Aggressive risk factor modification 3. Continue Lipitor 40 mg/day. 4. Not currently an issue on low-dose Migratine. 5. Vaccinated, lucid, and practicing medication technique.  Target BP: <130/80 mmHg  Diet and lifestyle measures for BP control were reviewed in detail: Low sodium diet (<2.5 gm daily); alcohol restriction (<3 ounces per day); weight loss (Mediterranean); avoid non-steroidal agents; > 6 hours sleep per day; 150 min moderate exercise per week. Medical regimen will include at least 2 agents. Resistant hypertension if not controlled on 3 agents. Consider further evaluation: Sleep study to r/o OSA; Renal angiogram; Primary hyperaldonism and Pheochromocytoma w/u. After 3 agents, consider MRA (spironolactone)/ Epleronone), hydralazine, beta-blocker, and Minoxidil if not already in use due to patient profile.    Medication Adjustments/Labs and Tests Ordered: Current medicines are reviewed at length with the patient today.  Concerns  regarding medicines are outlined above.  Orders Placed This Encounter  Procedures  . EKG 12-Lead   Meds ordered this encounter  Medications  . carvedilol (COREG) 12.5 MG tablet    Sig: Take 1 tablet (12.5 mg total) by mouth 2 (two) times daily.    Dispense:  180 tablet    Refill:  3    Dose change    Patient Instructions  Medication Instructions:  1) INCREASE Carvedilol to 12.5mg  twice daily  *If you need a refill on your cardiac medications before your next appointment, please call your pharmacy*   Lab Work: None If you have labs (blood work) drawn today and your tests are completely normal, you will receive your results only by: Marland Kitchen MyChart Message (if you have MyChart) OR . A paper copy in the mail If you have any lab test that is abnormal or we need to change your treatment, we will call you to review the results.   Testing/Procedures: None   Follow-Up: At [facility], you and your health needs are our priority.  As part of our continuing mission to provide you with exceptional heart care, we have created designated Provider Care Teams.  These Care Teams include your primary Cardiologist (physician) and Advanced Practice Providers (APPs -  Physician Assistants and Nurse Practitioners) who all work together to provide you with the care you need, when you need it.  We recommend signing up for the patient portal called "MyChart".  Sign up information is provided on this After Visit Summary.  MyChart is used to connect with patients for Virtual Visits (Telemedicine).  Patients are able to view lab/test results, encounter notes, upcoming appointments, etc.  Non-urgent messages can be sent to your provider as well.   To learn more about what you can do with MyChart, go to [email or web address].    Your next appointment:   1 year(s)  The format for your next appointment:   In Person  Provider:   You may see Dr. Daneen Schick or one of the following Advanced Practice  Providers on your designated Care Team:    Truitt Merle, NP  Cecilie Kicks, NP  Kathyrn Drown, NP    Other Instructions      Signed,  Sinclair Grooms, MD  10/16/2020 4:54 PM    [place]

## 2020-10-16 ENCOUNTER — Other Ambulatory Visit: Payer: Self-pay

## 2020-10-16 ENCOUNTER — Ambulatory Visit: Payer: Medicare HMO | Admitting: Interventional Cardiology

## 2020-10-16 ENCOUNTER — Encounter: Payer: Self-pay | Admitting: Interventional Cardiology

## 2020-10-16 VITALS — BP 160/88 | HR 59 | Ht 62.0 in | Wt 118.0 lb

## 2020-10-16 DIAGNOSIS — Z8249 Family history of ischemic heart disease and other diseases of the circulatory system: Secondary | ICD-10-CM | POA: Diagnosis not present

## 2020-10-16 DIAGNOSIS — Z7189 Other specified counseling: Secondary | ICD-10-CM | POA: Diagnosis not present

## 2020-10-16 DIAGNOSIS — I10 Essential (primary) hypertension: Secondary | ICD-10-CM | POA: Diagnosis not present

## 2020-10-16 DIAGNOSIS — E871 Hypo-osmolality and hyponatremia: Secondary | ICD-10-CM | POA: Diagnosis not present

## 2020-10-16 DIAGNOSIS — E785 Hyperlipidemia, unspecified: Secondary | ICD-10-CM

## 2020-10-16 MED ORDER — CARVEDILOL 12.5 MG PO TABS: 12.5000 mg | ORAL_TABLET | Freq: Two times a day (BID) | ORAL | 3 refills | Status: DC

## 2020-10-16 MED ORDER — CARVEDILOL 12.5 MG PO TABS
12.5000 mg | ORAL_TABLET | Freq: Two times a day (BID) | ORAL | 3 refills | Status: DC
Start: 2020-10-16 — End: 2021-11-27

## 2020-10-16 NOTE — Patient Instructions (Signed)
Medication Instructions:  1) INCREASE Carvedilol to 12.5mg  twice daily  *If you need a refill on your cardiac medications before your next appointment, please call your pharmacy*   Lab Work: None If you have labs (blood work) drawn today and your tests are completely normal, you will receive your results only by: Marland Kitchen MyChart Message (if you have MyChart) OR . A paper copy in the mail If you have any lab test that is abnormal or we need to change your treatment, we will call you to review the results.   Testing/Procedures: None   Follow-Up: At [facility], you and your health needs are our priority.  As part of our continuing mission to provide you with exceptional heart care, we have created designated Provider Care Teams.  These Care Teams include your primary Cardiologist (physician) and Advanced Practice Providers (APPs -  Physician Assistants and Nurse Practitioners) who all work together to provide you with the care you need, when you need it.  We recommend signing up for the patient portal called "MyChart".  Sign up information is provided on this After Visit Summary.  MyChart is used to connect with patients for Virtual Visits (Telemedicine).  Patients are able to view lab/test results, encounter notes, upcoming appointments, etc.  Non-urgent messages can be sent to your provider as well.   To learn more about what you can do with MyChart, go to [email or web address].    Your next appointment:   1 year(s)  The format for your next appointment:   In Person  Provider:   You may see Dr. Daneen Schick or one of the following Advanced Practice Providers on your designated Care Team:    Truitt Merle, NP  Cecilie Kicks, NP  Kathyrn Drown, NP    Other Instructions

## 2020-10-21 DIAGNOSIS — H25813 Combined forms of age-related cataract, bilateral: Secondary | ICD-10-CM | POA: Diagnosis not present

## 2020-10-21 DIAGNOSIS — H04123 Dry eye syndrome of bilateral lacrimal glands: Secondary | ICD-10-CM | POA: Diagnosis not present

## 2020-10-22 ENCOUNTER — Encounter: Payer: Self-pay | Admitting: Family Medicine

## 2020-11-14 ENCOUNTER — Ambulatory Visit: Payer: Medicare HMO | Admitting: Interventional Cardiology

## 2020-11-17 ENCOUNTER — Other Ambulatory Visit: Payer: Self-pay | Admitting: Interventional Cardiology

## 2020-12-29 ENCOUNTER — Other Ambulatory Visit: Payer: Self-pay

## 2020-12-30 ENCOUNTER — Other Ambulatory Visit: Payer: Self-pay

## 2020-12-30 ENCOUNTER — Ambulatory Visit (HOSPITAL_BASED_OUTPATIENT_CLINIC_OR_DEPARTMENT_OTHER)
Admission: RE | Admit: 2020-12-30 | Discharge: 2020-12-30 | Disposition: A | Discharge status: Home / Self Care | Payer: Medicare HMO | Source: Ambulatory Visit | Attending: Family Medicine | Admitting: Family Medicine

## 2020-12-30 ENCOUNTER — Encounter: Payer: Self-pay | Admitting: Family Medicine

## 2020-12-30 ENCOUNTER — Ambulatory Visit (INDEPENDENT_AMBULATORY_CARE_PROVIDER_SITE_OTHER): Payer: Medicare HMO | Admitting: Family Medicine

## 2020-12-30 VITALS — BP 120/72 | HR 69 | Temp 97.6°F | Resp 18 | Ht 62.0 in | Wt 117.8 lb

## 2020-12-30 DIAGNOSIS — E785 Hyperlipidemia, unspecified: Secondary | ICD-10-CM

## 2020-12-30 DIAGNOSIS — R079 Chest pain, unspecified: Secondary | ICD-10-CM

## 2020-12-30 DIAGNOSIS — I10 Essential (primary) hypertension: Secondary | ICD-10-CM

## 2020-12-30 DIAGNOSIS — Z0000 Encounter for general adult medical examination without abnormal findings: Secondary | ICD-10-CM

## 2020-12-30 LAB — COMPREHENSIVE METABOLIC PANEL
ALT: 17 U/L (ref 0–35)
AST: 20 U/L (ref 0–37)
Albumin: 4.6 g/dL (ref 3.5–5.2)
Alkaline Phosphatase: 62 U/L (ref 39–117)
BUN: 9 mg/dL (ref 6–23)
CO2: 30 mEq/L (ref 19–32)
Calcium: 9.5 mg/dL (ref 8.4–10.5)
Chloride: 95 mEq/L — ABNORMAL LOW (ref 96–112)
Creatinine, Ser: 0.6 mg/dL (ref 0.40–1.20)
GFR: 89 mL/min (ref >60.00)
Glucose, Bld: 94 mg/dL (ref 70–99)
Potassium: 3.6 mEq/L (ref 3.5–5.1)
Sodium: 133 mEq/L — ABNORMAL LOW (ref 135–145)
Total Bilirubin: 0.6 mg/dL (ref 0.2–1.2)
Total Protein: 7.1 g/dL (ref 6.0–8.3)

## 2020-12-30 LAB — LIPID PANEL
Cholesterol: 142 mg/dL (ref 0–200)
HDL: 81.5 mg/dL (ref >39.00)
LDL Cholesterol: 51 mg/dL (ref 0–99)
NonHDL: 60.05
Total CHOL/HDL Ratio: 2
Triglycerides: 46 mg/dL (ref 0.0–149.0)
VLDL: 9.2 mg/dL (ref 0.0–40.0)

## 2020-12-30 LAB — CBC WITH DIFFERENTIAL/PLATELET
Basophils Absolute: 0 10*3/uL (ref 0.0–0.1)
Basophils Relative: 0.9 % (ref 0.0–3.0)
Eosinophils Absolute: 0.5 10*3/uL (ref 0.0–0.7)
Eosinophils Relative: 9.8 % — ABNORMAL HIGH (ref 0.0–5.0)
HCT: 36.7 % (ref 36.0–46.0)
Hemoglobin: 12.6 g/dL (ref 12.0–15.0)
Lymphocytes Relative: 31.1 % (ref 12.0–46.0)
Lymphs Abs: 1.5 10*3/uL (ref 0.7–4.0)
MCHC: 34.2 g/dL (ref 30.0–36.0)
MCV: 94.1 fl (ref 78.0–100.0)
Monocytes Absolute: 0.4 10*3/uL (ref 0.1–1.0)
Monocytes Relative: 8.3 % (ref 3.0–12.0)
Neutro Abs: 2.5 10*3/uL (ref 1.4–7.7)
Neutrophils Relative %: 49.9 % (ref 43.0–77.0)
Platelets: 290 10*3/uL (ref 150.0–400.0)
RBC: 3.9 Mil/uL (ref 3.87–5.11)
RDW: 12.9 % (ref 11.5–15.5)
WBC: 4.9 10*3/uL (ref 4.0–10.5)

## 2020-12-30 LAB — TROPONIN I (HIGH SENSITIVITY): High Sens Troponin I: 3 ng/L (ref 2–17)

## 2020-12-30 LAB — D-DIMER, QUANTITATIVE: D-Dimer, Quant: <0.19 mcg/mL FEU (ref <0.50)

## 2020-12-30 NOTE — Progress Notes (Signed)
--Subjective:     Natasha Trujillo is a 75 y.o. female and is here for a comprehensive physical exam. The patient reports problems - she c/o chest pressure x1 that did not last long but she felt sob at the same time.  it has not occurred again .  Social History   Socioeconomic History  . Marital status: Married    Spouse name: Not on file  . Number of children: Not on file  . Years of education: Not on file  . Highest education level: Not on file  Occupational History  . Occupation: retired    Fish farm manager: RETIRED  Tobacco Use  . Smoking status: Never Smoker  . Smokeless tobacco: Never Used  Vaping Use  . Vaping Use: Never used  Substance and Sexual Activity  . Alcohol use: Yes    Comment: occasional  . Drug use: No  . Sexual activity: Yes    Partners: Male  Other Topics Concern  . Not on file  Social History Narrative   Exercise--walk 4 miles 5 days a week   Social Determinants of Radio broadcast assistant Strain: Not on file  Food Insecurity: Not on file  Transportation Needs: Not on file  Physical Activity: Not on file  Stress: Not on file  Social Connections: Not on file  Intimate Partner Violence: Not on file   Health Maintenance  Topic Date Due  . MAMMOGRAM  08/08/2020  . COLONOSCOPY (Pts 45-71yrs Insurance coverage will need to be confirmed)  03/27/2024  . TETANUS/TDAP  12/17/2029  . INFLUENZA VACCINE  Completed  . DEXA SCAN  Completed  . COVID-19 Vaccine  Completed  . Hepatitis C Screening  Completed  . PNA vac Low Risk Adult  Completed    The following portions of the patient's history were reviewed and updated as appropriate:  She  has a past medical history of Hyperlipidemia, Hypertension, Osteopenia, and Osteoporosis. She does not have any pertinent problems on file. She  has a past surgical history that includes Tubal ligation; Knee surgery (Left, 09/12/13); Blepharoplasty (Bilateral, 08/2016); and Shoulder surgery (Right, 01/05/2017). Her  family history includes Cancer (age of onset: 70) in her maternal aunt; Cervical cancer in an other family member; Coronary artery disease in an other family member; Dementia in her mother; Diabetes in an other family member; Diabetes (age of onset: 84) in her brother; Heart attack (age of onset: 2) in her son; Heart disease in her brother; Heart disease (age of onset: 15) in her father and mother; Hypertension in her brother, father, and mother; Lung cancer in an other family member. She  reports that she has never smoked. She has never used smokeless tobacco. She reports current alcohol use. She reports that she does not use drugs. She has a current medication list which includes the following prescription(s): atorvastatin, carvedilol, chlorthalidone, and olmesartan. Current Outpatient Medications on File Prior to Visit  Medication Sig Dispense Refill  . atorvastatin (LIPITOR) 40 MG tablet Take 1 tablet (40 mg total) by mouth daily. 90 tablet 3  . carvedilol (COREG) 12.5 MG tablet Take 1 tablet (12.5 mg total) by mouth 2 (two) times daily. 180 tablet 3  . chlorthalidone (HYGROTON) 25 MG tablet Take 0.5 tablets (12.5 mg total) by mouth daily. 45 tablet 3  . olmesartan (BENICAR) 40 MG tablet TAKE 1 TABLET EVERY DAY 90 tablet 3   No current facility-administered medications on file prior to visit.   She is allergic to lisinopril and adrenalin [epinephrine].Marland Kitchen  Review of Systems Review of Systems  Constitutional: Negative for activity change, appetite change and fatigue.  HENT: Negative for hearing loss, congestion, tinnitus and ear discharge.  dentist q28m Eyes: Negative for visual disturbance (see optho q1y -- vision corrected to 20/20 with glasses).  Respiratory: Negative for cough, chest tightness and shortness of breath.   Cardiovascular: Negative for chest pain, palpitations and leg swelling.  Gastrointestinal: Negative for abdominal pain, diarrhea, constipation and abdominal distention.   Genitourinary: Negative for urgency, frequency, decreased urine volume and difficulty urinating.  Musculoskeletal: Negative for back pain, arthralgias and gait problem.  Skin: Negative for color change, pallor and rash.  Neurological: Negative for dizziness, light-headedness, numbness and headaches.  Hematological: Negative for adenopathy. Does not bruise/bleed easily.  Psychiatric/Behavioral: Negative for suicidal ideas, confusion, sleep disturbance, self-injury, dysphoric mood, decreased concentration and agitation.       Objective:    BP 120/72 (BP Location: Right Arm, Patient Position: Sitting, Cuff Size: Normal)   Pulse 69   Temp 97.6 F (36.4 C) (Oral)   Resp 18   Ht 5\' 2"  (1.575 m)   Wt 117 lb 12.8 oz (53.4 kg)   SpO2 100%   BMI 21.55 kg/m  General appearance: alert, cooperative, appears stated age and no distress Head: Normocephalic, without obvious abnormality, atraumatic Eyes: negative findings: lids and lashes normal, conjunctivae and sclerae normal and pupils equal, round, reactive to light and accomodation Ears: normal TM's and external ear canals both ears Neck: no adenopathy, no carotid bruit, no JVD, supple, symmetrical, trachea midline and thyroid not enlarged, symmetric, no tenderness/mass/nodules Back: symmetric, no curvature. ROM normal. No CVA tenderness. Lungs: clear to auscultation bilaterally Breasts: normal appearance, no masses or tenderness Heart: regular rate and rhythm, S1, S2 normal, no murmur, click, rub or gallop Abdomen: soft, non-tender; bowel sounds normal; no masses,  no organomegaly Pelvic: deferred Extremities: extremities normal, atraumatic, no cyanosis or edema Pulses: 2+ and symmetric Skin: Skin color, texture, turgor normal. No rashes or lesions Lymph nodes: Cervical, supraclavicular, and axillary nodes normal. Neurologic: Alert and oriented X 3, normal strength and tone. Normal symmetric reflexes. Normal coordination and gait      EKG--sinus brady,  No change from one done in cardiology 12/9 Assessment:    Healthy female exam.      Plan:    ghm utd Check labs  See After Visit Summary for Counseling Recommendations    1. Primary hypertension Well controlled, no changes to meds. Encouraged heart healthy diet such as the DASH diet and exercise as tolerated.  - Lipid panel - CBC with Differential/Platelet - Comprehensive metabolic panel  2. Hyperlipidemia, unspecified hyperlipidemia type Tolerating statin, encouraged heart healthy diet, avoid trans fats, minimize simple carbs and saturated fats. Increase exercise as tolerated - Lipid panel - Comprehensive metabolic panel  3. Preventative health care See above   4. Chest pain, unspecified type New--- has f/u with cardiology ekg-- no changes cxr and labs  If pain occurs again go to er-- pt encouraged to call cardiology as well  - EKG 12-Lead - DG Chest 2 View; Future - Troponin I (High Sensitivity) - D-Dimer, Quantitative

## 2020-12-30 NOTE — Patient Instructions (Signed)
Preventive Care 61 Years and Older, Female Preventive care refers to lifestyle choices and visits with your health care provider that can promote health and wellness. This includes:  A yearly physical exam. This is also called an annual wellness visit.  Regular dental and eye exams.  Immunizations.  Screening for certain conditions.  Healthy lifestyle choices, such as: ? Eating a healthy diet. ? Getting regular exercise. ? Not using drugs or products that contain nicotine and tobacco. ? Limiting alcohol use. What can I expect for my preventive care visit? Physical exam Your health care provider will check your:  Height and weight. These may be used to calculate your BMI (body mass index). BMI is a measurement that tells if you are at a healthy weight.  Heart rate and blood pressure.  Body temperature.  Skin for abnormal spots. Counseling Your health care provider may ask you questions about your:  Past medical problems.  Family's medical history.  Alcohol, tobacco, and drug use.  Emotional well-being.  Home life and relationship well-being.  Sexual activity.  Diet, exercise, and sleep habits.  History of falls.  Memory and ability to understand (cognition).  Work and work Statistician.  Pregnancy and menstrual history.  Access to firearms. What immunizations do I need? Vaccines are usually given at various ages, according to a schedule. Your health care provider will recommend vaccines for you based on your age, medical history, and lifestyle or other factors, such as travel or where you work.   What tests do I need? Blood tests  Lipid and cholesterol levels. These may be checked every 5 years, or more often depending on your overall health.  Hepatitis C test.  Hepatitis B test. Screening  Lung cancer screening. You may have this screening every year starting at age 74 if you have a 30-pack-year history of smoking and currently smoke or have quit within  the past 15 years.  Colorectal cancer screening. ? All adults should have this screening starting at age 44 and continuing until age 58. ? Your health care provider may recommend screening at age 2 if you are at increased risk. ? You will have tests every 1-10 years, depending on your results and the type of screening test.  Diabetes screening. ? This is done by checking your blood sugar (glucose) after you have not eaten for a while (fasting). ? You may have this done every 1-3 years.  Mammogram. ? This may be done every 1-2 years. ? Talk with your health care provider about how often you should have regular mammograms.  Abdominal aortic aneurysm (AAA) screening. You may need this if you are a current or former smoker.  BRCA-related cancer screening. This may be done if you have a family history of breast, ovarian, tubal, or peritoneal cancers. Other tests  STD (sexually transmitted disease) testing, if you are at risk.  Bone density scan. This is done to screen for osteoporosis. You may have this done starting at age 104. Talk with your health care provider about your test results, treatment options, and if necessary, the need for more tests. Follow these instructions at home: Eating and drinking  Eat a diet that includes fresh fruits and vegetables, whole grains, lean protein, and low-fat dairy products. Limit your intake of foods with high amounts of sugar, saturated fats, and salt.  Take vitamin and mineral supplements as recommended by your health care provider.  Do not drink alcohol if your health care provider tells you not to drink.  If you drink alcohol: ? Limit how much you have to 0-1 drink a day. ? Be aware of how much alcohol is in your drink. In the [place] one drink equals one 12 oz bottle of beer (355 mL), one 5 oz glass of wine (148 mL), or one 1 oz glass of hard liquor (44 mL).   Lifestyle  Take daily care of your teeth and gums. Brush your teeth every morning  and night with fluoride toothpaste. Floss one time each day.  Stay active. Exercise for at least 30 minutes 5 or more days each week.  Do not use any products that contain nicotine or tobacco, such as cigarettes, e-cigarettes, and chewing tobacco. If you need help quitting, ask your health care provider.  Do not use drugs.  If you are sexually active, practice safe sex. Use a condom or other form of protection in order to prevent STIs (sexually transmitted infections).  Talk with your health care provider about taking a low-dose aspirin or statin.  Find healthy ways to cope with stress, such as: ? Meditation, yoga, or listening to music. ? Journaling. ? Talking to a trusted person. ? Spending time with friends and family. Safety  Always wear your seat belt while driving or riding in a vehicle.  Do not drive: ? If you have been drinking alcohol. Do not ride with someone who has been drinking. ? When you are tired or distracted. ? While texting.  Wear a helmet and other protective equipment during sports activities.  If you have firearms in your house, make sure you follow all gun safety procedures. What's next?  Visit your health care provider once a year for an annual wellness visit.  Ask your health care provider how often you should have your eyes and teeth checked.  Stay up to date on all vaccines. This information is not intended to replace advice given to you by your health care provider. Make sure you discuss any questions you have with your health care provider. Document Revised: 10/15/2020 Document Reviewed: 10/19/2018 Elsevier Patient Education  2021 [employer].

## 2020-12-31 ENCOUNTER — Encounter: Payer: Self-pay | Admitting: Family Medicine

## 2020-12-31 NOTE — Telephone Encounter (Signed)
Spoke with pt. Clarified the labs and advised that the imaging wasn't back yet

## 2020-12-31 NOTE — Telephone Encounter (Signed)
The chest xray results have not been released yet --- so i'm not sure why she mentioned a clot The d dimer was neg -- that is a blood test to rule out a clot  Please call pt to let her know this

## 2021-01-01 ENCOUNTER — Encounter: Payer: Self-pay | Admitting: Family Medicine

## 2021-01-01 NOTE — Telephone Encounter (Signed)
Chronic --- meaning old  No change in xray since 2008---- nothing to explain chest pain or shortness of breath

## 2021-01-05 ENCOUNTER — Encounter: Payer: Self-pay | Admitting: Family Medicine

## 2021-01-05 DIAGNOSIS — E785 Hyperlipidemia, unspecified: Secondary | ICD-10-CM

## 2021-01-05 DIAGNOSIS — Z8249 Family history of ischemic heart disease and other diseases of the circulatory system: Secondary | ICD-10-CM

## 2021-01-05 NOTE — Telephone Encounter (Signed)
Its part of the cbc---- usually see this esp with change of seasons--- normally releated to allergies

## 2021-01-06 DIAGNOSIS — Z1231 Encounter for screening mammogram for malignant neoplasm of breast: Secondary | ICD-10-CM | POA: Diagnosis not present

## 2021-01-20 ENCOUNTER — Encounter: Payer: Self-pay | Admitting: Family Medicine

## 2021-01-30 NOTE — Telephone Encounter (Signed)
Please see below.

## 2021-02-03 ENCOUNTER — Other Ambulatory Visit: Payer: Self-pay

## 2021-02-03 ENCOUNTER — Ambulatory Visit (INDEPENDENT_AMBULATORY_CARE_PROVIDER_SITE_OTHER)
Admission: RE | Admit: 2021-02-03 | Discharge: 2021-02-03 | Disposition: A | Discharge status: Home / Self Care | Payer: Self-pay | Source: Ambulatory Visit | Attending: Interventional Cardiology | Admitting: Interventional Cardiology

## 2021-02-03 DIAGNOSIS — E785 Hyperlipidemia, unspecified: Secondary | ICD-10-CM

## 2021-02-03 DIAGNOSIS — Z8249 Family history of ischemic heart disease and other diseases of the circulatory system: Secondary | ICD-10-CM

## 2021-02-05 NOTE — Telephone Encounter (Signed)
I have sent an email to [employer] asking them to please reach out to the patient regarding this billing concern.

## 2021-02-12 DIAGNOSIS — D1801 Hemangioma of skin and subcutaneous tissue: Secondary | ICD-10-CM | POA: Diagnosis not present

## 2021-02-12 DIAGNOSIS — L814 Other melanin hyperpigmentation: Secondary | ICD-10-CM | POA: Diagnosis not present

## 2021-02-12 DIAGNOSIS — L821 Other seborrheic keratosis: Secondary | ICD-10-CM | POA: Diagnosis not present

## 2021-04-10 MED ORDER — CHLORTHALIDONE 25 MG PO TABS: 12.5000 mg | ORAL_TABLET | Freq: Every day | ORAL | 1 refills | Status: DC

## 2021-07-12 ENCOUNTER — Other Ambulatory Visit: Payer: Self-pay | Admitting: Family Medicine

## 2021-08-10 ENCOUNTER — Telehealth: Payer: Self-pay | Admitting: Family Medicine

## 2021-08-10 NOTE — Telephone Encounter (Signed)
Left message for patient to call back and schedule Medicare Annual Wellness Visit (AWV) in office.  ° °If not able to come in office, please offer to do virtually or by telephone.  Left office number and my jabber #[phone]. ° °Last AWV:10/12/2019 ° °Please schedule at anytime with Nurse Health Advisor. °  °

## 2021-08-30 ENCOUNTER — Other Ambulatory Visit: Payer: Self-pay | Admitting: Interventional Cardiology

## 2021-09-16 ENCOUNTER — Other Ambulatory Visit: Payer: Self-pay | Admitting: Interventional Cardiology

## 2021-09-22 ENCOUNTER — Encounter: Payer: Self-pay | Admitting: Interventional Cardiology

## 2021-09-22 ENCOUNTER — Ambulatory Visit: Payer: Medicare HMO | Admitting: Interventional Cardiology

## 2021-09-22 ENCOUNTER — Other Ambulatory Visit: Payer: Self-pay

## 2021-09-22 VITALS — BP 160/90 | HR 56 | Ht 62.0 in | Wt 119.6 lb

## 2021-09-22 DIAGNOSIS — R0609 Other forms of dyspnea: Secondary | ICD-10-CM | POA: Diagnosis not present

## 2021-09-22 DIAGNOSIS — I10 Essential (primary) hypertension: Secondary | ICD-10-CM

## 2021-09-22 DIAGNOSIS — Z8249 Family history of ischemic heart disease and other diseases of the circulatory system: Secondary | ICD-10-CM

## 2021-09-22 DIAGNOSIS — E785 Hyperlipidemia, unspecified: Secondary | ICD-10-CM

## 2021-09-22 MED ORDER — AMLODIPINE BESYLATE 5 MG PO TABS: 5.0000 mg | ORAL_TABLET | Freq: Every day | ORAL | 3 refills | Status: DC

## 2021-09-22 NOTE — Patient Instructions (Signed)
Medication Instructions:  1) START Amlodipine 5mg  once daily  *If you need a refill on your cardiac medications before your next appointment, please call your pharmacy*   Lab Work: None If you have labs (blood work) drawn today and your tests are completely normal, you will receive your results only by: [place] (if you have MyChart) OR A paper copy in the mail If you have any lab test that is abnormal or we need to change your treatment, we will call you to review the results.   Testing/Procedures: None   Follow-Up:  Your physician recommends that you schedule a follow-up appointment in: 2-4 weeks with the Hypertension Clinic.   At [facility], you and your health needs are our priority.  As part of our continuing mission to provide you with exceptional heart care, we have created designated Provider Care Teams.  These Care Teams include your primary Cardiologist (physician) and Advanced Practice Providers (APPs -  Physician Assistants and Nurse Practitioners) who all work together to provide you with the care you need, when you need it.  We recommend signing up for the patient portal called "MyChart".  Sign up information is provided on this After Visit Summary.  MyChart is used to connect with patients for Virtual Visits (Telemedicine).  Patients are able to view lab/test results, encounter notes, upcoming appointments, etc.  Non-urgent messages can be sent to your provider as well.   To learn more about what you can do with MyChart, go to [email or web address].    Your next appointment:   4 month(s)  The format for your next appointment:   In Person  Provider:   [facility]. Blenda Bridegroom, MD    Other Instructions

## 2021-09-22 NOTE — Progress Notes (Signed)
Cardiology Office Note:    Date:  09/22/2021   ID:  Natasha Trujillo, DOB 04-01-1946, MRN [id number]  PCP:  Carollee Herter, Alferd Apa, DO  Cardiologist:  None   Referring MD: Carollee Herter, Alferd Apa, *   Chief Complaint  Patient presents with   Coronary Artery Disease   Hyperlipidemia    History of Present Illness:    Natasha Trujillo is a 75 y.o. female with a hx of essential hypertension, family h/o premature CAD, and primary hypertension.  Difficulty with dyspnea, leg fatigue, and general intolerance of her typical 4 mile walk that she has been doing for years.  No chest pain.  Denies orthopnea PND, edema, palpitations, and syncope.  She sleeps well.   Past Medical History:  Diagnosis Date   Hyperlipidemia    Hypertension    Osteopenia    Osteoporosis     Past Surgical History:  Procedure Laterality Date   BLEPHAROPLASTY Bilateral 08/2016   Dr Shelbie Proctor   KNEE SURGERY Left 09/12/13   SHOULDER SURGERY Right 01/05/2017   Dr.Caffrey   TUBAL LIGATION      Current Medications: Current Meds  Medication Sig   amLODipine (NORVASC) 5 MG tablet Take 1 tablet (5 mg total) by mouth daily.   aspirin 81 MG EC tablet Take 81 mg by mouth daily.   atorvastatin (LIPITOR) 40 MG tablet TAKE 1 TABLET EVERY DAY   carvedilol (COREG) 12.5 MG tablet Take 1 tablet (12.5 mg total) by mouth 2 (two) times daily.   chlorthalidone (HYGROTON) 25 MG tablet TAKE 1/2 TABLET EVERY DAY   olmesartan (BENICAR) 40 MG tablet TAKE 1 TABLET EVERY DAY     Allergies:   Lisinopril and Adrenalin [epinephrine]   Social History   Socioeconomic History   Marital status: Married    Spouse name: Not on file   Number of children: Not on file   Years of education: Not on file   Highest education level: Not on file  Occupational History   Occupation: retired    Fish farm manager: RETIRED  Tobacco Use   Smoking status: Never   Smokeless tobacco: Never  Vaping Use   Vaping Use: Never used  Substance and  Sexual Activity   Alcohol use: Yes    Comment: occasional   Drug use: No   Sexual activity: Yes    Partners: Male  Other Topics Concern   Not on file  Social History Narrative   Exercise--walk 4 miles 5 days a week   Social Determinants of Radio broadcast assistant Strain: Not on file  Food Insecurity: Not on file  Transportation Needs: Not on file  Physical Activity: Not on file  Stress: Not on file  Social Connections: Not on file     Family History: The patient's family history includes Cancer (age of onset: 35) in her maternal aunt; Cervical cancer in an other family member; Coronary artery disease in an other family member; Dementia in her mother; Diabetes in an other family member; Diabetes (age of onset: 62) in her brother; Heart attack (age of onset: 88) in her son; Heart disease in her brother; Heart disease (age of onset: 16) in her father and mother; Hypertension in her brother, father, and mother; Lung cancer in an other family member. There is no history of Colon cancer, Esophageal cancer, Rectal cancer, or Stomach cancer.  ROS:   Please see the history of present illness.    Other than exertional fatigue she is doing well.  Medication regimen has not been significantly changed.  All other systems reviewed and are negative.  EKGs/Labs/Other Studies Reviewed:    The following studies were reviewed today:  Coronary calcium score February 03, 2021: Ascending Aorta: Mild dilatation measuring 81mm   Non-cardiac: See separate report from [facility].   IMPRESSION: Coronary calcium score of 173. This was 73rd percentile for age-, race-, and sex-matched controls.   EKG:  EKG sinus bradycardia, 56 bpm.  Normal EKG and when compared to the prior tracing from 2022, no changes are noted.  Recent Labs: 12/30/2020: ALT 17; BUN 9; Creatinine, Ser 0.60; Hemoglobin 12.6; Platelets 290.0; Potassium 3.6; Sodium 133  Recent Lipid Panel    Component Value Date/Time    CHOL 142 12/30/2020 1452   CHOL 140 09/13/2019 1001   TRIG 46.0 12/30/2020 1452   HDL 81.50 12/30/2020 1452   HDL 89 09/13/2019 1001   CHOLHDL 2 12/30/2020 1452   VLDL 9.2 12/30/2020 1452   LDLCALC 51 12/30/2020 1452   LDLCALC 41 09/13/2019 1001    Physical Exam:    VS:  BP (!) 160/90   Pulse (!) 56   Ht 5\' 2"  (1.575 m)   Wt 119 lb 9.6 oz (54.3 kg)   SpO2 99%   BMI 21.88 kg/m     Wt Readings from Last 3 Encounters:  09/22/21 119 lb 9.6 oz (54.3 kg)  12/30/20 117 lb 12.8 oz (53.4 kg)  10/16/20 118 lb (53.5 kg)     GEN: Healthy appearing. No acute distress HEENT: Normal NECK: No JVD. LYMPHATICS: No lymphadenopathy CARDIAC: No murmur. RRR no gallop, or edema. VASCULAR:  Normal Pulses. No bruits. RESPIRATORY:  Clear to auscultation without rales, wheezing or rhonchi  ABDOMEN: Soft, non-tender, non-distended, No pulsatile mass, MUSCULOSKELETAL: No deformity  SKIN: Warm and dry NEUROLOGIC:  Alert and oriented x 3 PSYCHIATRIC:  Normal affect   ASSESSMENT:    1. Dyspnea on exertion   2. Essential hypertension   3. Hyperlipidemia LDL goal <100   4. Family history of early CAD    PLAN:    In order of problems listed above:  Possibly related to diastolic dysfunction and poorly controlled hypertension.  We will start by instituting better blood pressure control with a target of 130/80 mmHg in mind.  Further medication titration by the pharmacy clinic.  Next option would be addition of low-dose spironolactone or some other agent to achieve the goal.  I do not think further increase in beta-blocker therapy is a good idea due to her current heart rate.  If dyspnea on exertion continues after reasonable blood pressure control she should have an exercise treadmill test and echocardiogram. Target 130/80 mmHg.  Amlodipine 5 mg daily is being added. Continue high intensity atorvastatin therapy.  Last LDL was 51.  Target BP: <130/80 mmHg  Diet and lifestyle measures for BP control  were reviewed in detail: Low sodium diet (<2.5 gm daily); alcohol restriction (<3 ounces per day); weight loss (Mediterranean); avoid non-steroidal agents; > 6 hours sleep per day; 150 min moderate exercise per week. Medical regimen will include at least 2 agents. Resistant hypertension if not controlled on 3 agents. Consider further evaluation: Sleep study to r/o OSA; Renal angiogram; Primary hyperaldonism and Pheochromocytoma w/u. After 3 agents, consider MRA (spironolactone)/ Epleronone), hydralazine, beta-blocker, and Minoxidil if not already in use due to patient profile.    Medication Adjustments/Labs and Tests Ordered: Current medicines are reviewed at length with the patient today.  Concerns regarding medicines are  outlined above.  No orders of the defined types were placed in this encounter.  Meds ordered this encounter  Medications   amLODipine (NORVASC) 5 MG tablet    Sig: Take 1 tablet (5 mg total) by mouth daily.    Dispense:  90 tablet    Refill:  3    Patient Instructions  Medication Instructions:  1) START Amlodipine 5mg  once daily  *If you need a refill on your cardiac medications before your next appointment, please call your pharmacy*   Lab Work: None If you have labs (blood work) drawn today and your tests are completely normal, you will receive your results only by: [place] (if you have MyChart) OR A paper copy in the mail If you have any lab test that is abnormal or we need to change your treatment, we will call you to review the results.   Testing/Procedures: None   Follow-Up:  Your physician recommends that you schedule a follow-up appointment in: 2-4 weeks with the Hypertension Clinic.   At [facility], you and your health needs are our priority.  As part of our continuing mission to provide you with exceptional heart care, we have created designated Provider Care Teams.  These Care Teams include your primary Cardiologist (physician) and  Advanced Practice Providers (APPs -  Physician Assistants and Nurse Practitioners) who all work together to provide you with the care you need, when you need it.  We recommend signing up for the patient portal called "MyChart".  Sign up information is provided on this After Visit Summary.  MyChart is used to connect with patients for Virtual Visits (Telemedicine).  Patients are able to view lab/test results, encounter notes, upcoming appointments, etc.  Non-urgent messages can be sent to your provider as well.   To learn more about what you can do with MyChart, go to [email or web address].    Your next appointment:   4 month(s)  The format for your next appointment:   In Person  Provider:   [facility]. Blenda Bridegroom, MD    Other Instructions     Signed, Sinclair Grooms, MD  09/22/2021 2:40 PM    [place]

## 2021-09-25 ENCOUNTER — Encounter: Payer: Self-pay | Admitting: Family Medicine

## 2021-09-25 DIAGNOSIS — Z23 Encounter for immunization: Secondary | ICD-10-CM | POA: Diagnosis not present

## 2021-09-29 NOTE — Addendum Note (Signed)
Addended byDanielle Dess on: 09/29/2021 10:19 AM   Modules accepted: Orders

## 2021-10-08 ENCOUNTER — Ambulatory Visit: Payer: Medicare HMO | Admitting: Pharmacist

## 2021-10-08 ENCOUNTER — Other Ambulatory Visit: Payer: Self-pay

## 2021-10-08 VITALS — BP 140/80 | HR 67

## 2021-10-08 DIAGNOSIS — I10 Essential (primary) hypertension: Secondary | ICD-10-CM

## 2021-10-08 NOTE — Progress Notes (Addendum)
Patient ID: Natasha Trujillo                 DOB: 04-28-1946                      MRN: [id number]     HPI: Natasha Trujillo is a 75 y.o. female referred by Dr. Tamala Julian to HTN clinic. PMH is significant for HTN, HLD, and FHx of premature CAD. Seen most recently by Dr Tamala Julian on 11/15. BP was elevated at 160/90, she also reported dyspnea, leg fatigue, and general intolerance of her typical 4 mile walk she's been doing for years. She was started on amlodipine 5mg  daily and presents today for follow up.  Pt presents today in good spirits. Reports tolerating meds well. Denies dizziness, headache, blurred vision, and LE edema. Started noticing muscle fatigue/pain in her legs a few weeks to a month ago with her walking. Has checked BP ~5x since starting amlodipine. Reading was 126/69 this AM, 136/79 is the highest reading she recalls.  Current HTN meds: amlodipine 5mg  daily (AM), chlorthalidone 12.5mg  daily (AM), olmesartan 40mg  daily (PM), carvedilol 12.5mg  BID  Previously tried: lisinopril - throat swelling  BP goal: <130/29mmHg  Family History: Cancer (age of onset: 64) in her maternal aunt; Cervical cancer in an other family member; Coronary artery disease in an other family member; Dementia in her mother; Diabetes in an other family member; Diabetes (age of onset: 60) in her brother; Heart attack (age of onset: 54) in her son; Heart disease in her brother; Heart disease (age of onset: 48) in her father and mother; Hypertension in her brother, father, and mother; Lung cancer in an other family member.   Social History: Occasional alcohol use, denies tobacco and drug use.  Diet: coffee and cereal in the AM, maybe some fruit, lunch today was veggies, tuna, pretzels, cottage cheese. Dinner - 3-4 vegetables, eats meat if going out to eat.  Exercise: Walks 4 miles a day  Wt Readings from Last 3 Encounters:  09/22/21 119 lb 9.6 oz (54.3 kg)  12/30/20 117 lb 12.8 oz (53.4 kg)  10/16/20 118 lb  (53.5 kg)   BP Readings from Last 3 Encounters:  09/22/21 (!) 160/90  12/30/20 120/72  10/16/20 (!) 160/88   Pulse Readings from Last 3 Encounters:  09/22/21 (!) 56  12/30/20 69  10/16/20 (!) 59    Renal function: CrCl cannot be calculated (Patient's most recent lab result is older than the maximum 21 days allowed.).  Past Medical History:  Diagnosis Date   Hyperlipidemia    Hypertension    Osteopenia    Osteoporosis     Current Outpatient Medications on File Prior to Visit  Medication Sig Dispense Refill   amLODipine (NORVASC) 5 MG tablet Take 1 tablet (5 mg total) by mouth daily. 90 tablet 3   aspirin 81 MG EC tablet Take 81 mg by mouth daily.     atorvastatin (LIPITOR) 40 MG tablet TAKE 1 TABLET EVERY DAY 90 tablet 0   carvedilol (COREG) 12.5 MG tablet Take 1 tablet (12.5 mg total) by mouth 2 (two) times daily. 180 tablet 3   chlorthalidone (HYGROTON) 25 MG tablet TAKE 1/2 TABLET EVERY DAY 45 tablet 0   olmesartan (BENICAR) 40 MG tablet TAKE 1 TABLET EVERY DAY 90 tablet 0   No current facility-administered medications on file prior to visit.    Allergies  Allergen Reactions   Lisinopril Other (See Comments)    Throat swelling  Adrenalin [Epinephrine] Other (See Comments)    dizziness     Assessment/Plan:  1. Hypertension - BP has improved but remains above goal <130/18mmHg in clinic. Some home readings now at goal. Will increase amlodipine to 10mg  daily (pt will double up and take 2 5mg  tabs daily since a 90 day supply was sent of the 5mg  dose). Will continue olmesartan 40mg  daily, carvedilol 12.5mg  BID (HR ~60) and chlorthalidone 12.5mg  daily. Pt will monitor BP and bring log/cuff to follow up visit in 3 weeks.  2. Muscle pain - Unsure if related to her statin since she has been taking it for years, but to rule it out as a potential cause, advised pt to hold her atorvastatin for the next 1-2 weeks to see if muscle aches in her legs improve. If they do not, she  will resume. If they do, she will give me a call and will plan to change her to rosuvastatin 20mg  daily.   Paulita Licklider E. Alayza Pieper, PharmD, BCACP, [place] [address], [place], [place] [address] Phone: [phone]; Fax: [phone] 10/08/2021 3:36 PM

## 2021-10-08 NOTE — Patient Instructions (Addendum)
It was nice to meet you today!  Your blood pressure goal is < 130/36mmHg  Increase your amlodipine from 5mg  to 10mg  daily  Continue taking your other medications  Monitor your blood pressure at home and bring your log/cuff to your next visit in 3 weeks  Aim for < 2,000mg  of sodium each day  Try holding your atorvastatin for the next 1-2 weeks and see if the muscle aches in your legs improve. If they do not, then resume your atorvastatin. If they do, give me a call at [phone] and we can try you on a different medication called rosuvastatin (Crestor) that's equally as effective but is typically tolerated better

## 2021-10-16 ENCOUNTER — Other Ambulatory Visit: Payer: Medicare HMO | Admitting: *Deleted

## 2021-10-16 ENCOUNTER — Other Ambulatory Visit: Payer: Self-pay

## 2021-10-16 ENCOUNTER — Encounter: Payer: Self-pay | Admitting: Pharmacist

## 2021-10-16 DIAGNOSIS — I10 Essential (primary) hypertension: Secondary | ICD-10-CM | POA: Diagnosis not present

## 2021-10-16 MED ORDER — ROSUVASTATIN CALCIUM 20 MG PO TABS: 20.0000 mg | ORAL_TABLET | Freq: Every day | ORAL | 5 refills | Status: DC

## 2021-10-16 NOTE — Addendum Note (Signed)
Addended by: Rastus Borton E on: 10/16/2021 12:56 PM   Modules accepted: Orders

## 2021-10-17 LAB — BASIC METABOLIC PANEL
BUN/Creatinine Ratio: 19 (ref 12–28)
BUN: 13 mg/dL (ref 8–27)
CO2: 26 mmol/L (ref 20–29)
Calcium: 9.3 mg/dL (ref 8.7–10.3)
Chloride: 95 mmol/L — ABNORMAL LOW (ref 96–106)
Creatinine, Ser: 0.67 mg/dL (ref 0.57–1.00)
Glucose: 132 mg/dL — ABNORMAL HIGH (ref 70–99)
Potassium: 4 mmol/L (ref 3.5–5.2)
Sodium: 134 mmol/L (ref 134–144)
eGFR: 92 mL/min/{1.73_m2} (ref >59)

## 2021-10-19 ENCOUNTER — Encounter: Payer: Self-pay | Admitting: Pharmacist

## 2021-10-19 ENCOUNTER — Telehealth: Payer: Self-pay | Admitting: Pharmacist

## 2021-10-19 MED ORDER — HYDROCHLOROTHIAZIDE 25 MG PO TABS: 25.0000 mg | ORAL_TABLET | Freq: Every day | ORAL | 5 refills | Status: DC

## 2021-10-19 NOTE — Telephone Encounter (Signed)
Called pt to discuss lab results. BMET stable, Na on low end of normal range but slightly improved from previous. Cl just slightly low.   Pt has still been taking 10mg  of amlodipine and is still having swelling in her feet and lower ankles. Will decrease this back to 7.5mg  daily to see if this helps (previously tolerated 5mg  dose well but BP was still high).  Will also stop chlorthalidone 12.5mg  daily and replace with  HCTZ 25mg  daily - slightly higher dose but HCTZ less likely to cause electrolyte abnormalities. Will keep close eye on BMET and recheck at 12/22 appt with me.

## 2021-10-21 DIAGNOSIS — Z6823 Body mass index (BMI) 23.0-23.9, adult: Secondary | ICD-10-CM | POA: Diagnosis not present

## 2021-10-21 DIAGNOSIS — Z01419 Encounter for gynecological examination (general) (routine) without abnormal findings: Secondary | ICD-10-CM | POA: Diagnosis not present

## 2021-10-22 DIAGNOSIS — M8588 Other specified disorders of bone density and structure, other site: Secondary | ICD-10-CM | POA: Diagnosis not present

## 2021-10-22 DIAGNOSIS — N958 Other specified menopausal and perimenopausal disorders: Secondary | ICD-10-CM | POA: Diagnosis not present

## 2021-10-22 LAB — HM DEXA SCAN

## 2021-10-26 DIAGNOSIS — H25813 Combined forms of age-related cataract, bilateral: Secondary | ICD-10-CM | POA: Diagnosis not present

## 2021-10-29 ENCOUNTER — Other Ambulatory Visit: Payer: Self-pay

## 2021-10-29 ENCOUNTER — Ambulatory Visit: Payer: Medicare HMO | Admitting: Pharmacist

## 2021-10-29 VITALS — BP 110/60 | HR 71

## 2021-10-29 DIAGNOSIS — E785 Hyperlipidemia, unspecified: Secondary | ICD-10-CM | POA: Diagnosis not present

## 2021-10-29 DIAGNOSIS — I10 Essential (primary) hypertension: Secondary | ICD-10-CM

## 2021-10-29 NOTE — Patient Instructions (Signed)
Your blood pressure is at goal < 130/37mmHg  We'll recheck your electrolytes today  For now, continue your current blood pressure medications. I'll call you with your lab results - we may decrease either the dose of your amlodipine or HCTZ  Try using your husband's blood pressure cuff to see if it measures a bit more accurately  Have your primary care doctor check fasting cholesterol when you see her in a few months since we changed your atorvastatin to rosuvastatin

## 2021-10-29 NOTE — Progress Notes (Signed)
Patient ID: Natasha Trujillo                 DOB: August 31, 1946                      MRN: [id number]     HPI: Natasha Trujillo is a 75 y.o. female referred by Dr. Tamala Julian to HTN clinic. PMH is significant for HTN, HLD, and FHx of premature CAD. Seen most recently by Dr Tamala Julian on 11/15. BP was elevated at 160/90, she also reported dyspnea, leg fatigue, and general intolerance of her typical 4 mile walk she's been doing for years. She was started on amlodipine 5mg  daily.   I saw pt for follow up on 12/1 and further increased her amlodipine to 10mg  daily due to elevated BP of 140/80. Also advised her to hold her atorvastatin for a few weeks due to reported muscle aches in her legs to see if sx were related. Pt sent MyChart message 12/9 reporting swelling in her ankles and feet, improvement in BP readings, and improvement in cramping in her legs. I changed her to rosuvastatin to see if she would tolerate it better. I decreased her amlodipine to 7.5mg  daily and replaced chlorthalidone 12.5mg  daily with HCTZ 25mg  daily (higher dose but HCTZ less likely to cause electrolyte abnormalities and pt with hx of low Na).  Pt presents today in good spirits. Reports tolerating her medications well. No cramping in her legs since changing to rosuvastatin. LE edema has improved notably since reducing amlodipine from 10mg  to 7.5mg  daily. Reports some mild swelling but not bothersome. Had a hard time fitting her shoes on when she was on the 10mg  dose from the swelling. Has been busy with her granddaughters. Has been sleeping well. Denies dizziness, headaches. Brings in home BP cuff and readings. Has checked twice since most recent med changes - 99/59, 91/58. Was 140/80 at another MD office but pressure was checked as soon as she sat down instead of waiting. Home cuff reading today in clinic: 137/68, then 130/68, then 124/67. My manual reading x2 read 110/60.  Current HTN meds: amlodipine 7.5mg  daily (AM), HCTZ 25mg  daily  (AM), olmesartan 40mg  daily (PM), carvedilol 12.5mg  BID  Previously tried:  lisinopril - throat swelling amlodipine 10mg  daily - swelling in ankles and feet  BP goal: <130/76mmHg  Family History: Cancer (age of onset: 66) in her maternal aunt; Cervical cancer in an other family member; Coronary artery disease in an other family member; Dementia in her mother; Diabetes in an other family member; Diabetes (age of onset: 32) in her brother; Heart attack (age of onset: 81) in her son; Heart disease in her brother; Heart disease (age of onset: 46) in her father and mother; Hypertension in her brother, father, and mother; Lung cancer in an other family member.   Social History: Occasional alcohol use, denies tobacco and drug use.  Diet: coffee and cereal in the AM, maybe some fruit, lunch today was veggies, tuna, pretzels, cottage cheese. Dinner - 3-4 vegetables, eats meat if going out to eat.  Exercise: Walks 4 miles a day  Wt Readings from Last 3 Encounters:  09/22/21 119 lb 9.6 oz (54.3 kg)  12/30/20 117 lb 12.8 oz (53.4 kg)  10/16/20 118 lb (53.5 kg)   BP Readings from Last 3 Encounters:  10/08/21 140/80  09/22/21 (!) 160/90  12/30/20 120/72   Pulse Readings from Last 3 Encounters:  10/08/21 67  09/22/21 (!) 56  12/30/20 69  Renal function: CrCl cannot be calculated (Unknown ideal weight.).  Past Medical History:  Diagnosis Date   Hyperlipidemia    Hypertension    Osteopenia    Osteoporosis     Current Outpatient Medications on File Prior to Visit  Medication Sig Dispense Refill   amLODipine (NORVASC) 5 MG tablet Take 7.5 mg by mouth daily. 90 tablet 3   aspirin 81 MG EC tablet Take 81 mg by mouth daily.     carvedilol (COREG) 12.5 MG tablet Take 1 tablet (12.5 mg total) by mouth 2 (two) times daily. 180 tablet 3   hydrochlorothiazide (HYDRODIURIL) 25 MG tablet Take 1 tablet (25 mg total) by mouth daily. 30 tablet 5   olmesartan (BENICAR) 40 MG tablet TAKE 1 TABLET  EVERY DAY 90 tablet 0   rosuvastatin (CRESTOR) 20 MG tablet Take 1 tablet (20 mg total) by mouth daily. 30 tablet 5   No current facility-administered medications on file prior to visit.    Allergies  Allergen Reactions   Lisinopril Other (See Comments)    Throat swelling   Adrenalin [Epinephrine] Other (See Comments)    dizziness   Amlodipine     LE edema on 10mg  dose, tolerates 5mg  dose well     Assessment/Plan:  1. Hypertension - BP notably improved and now at goal <130/84mmHg in clinic. Home cuff does not seem to be measuring accurately. Her husband has a cuff she will try using instead. Will check BMET today with recent change in thiazide diuretic use. If Na is stable (hx of hyponatremia), will decrease amlodipine from 7.5mg  back to 5mg  daily to help completely resolve her LE edema. If Na is low, will decrease HCTZ to 12.5mg  daily and continue amlodipine at current dose of 7.5mg  since swelling is mild. Pt aware to continue current meds until she hears from me tomorrow with lab results and finalized med plan. Current BP meds include amlodipine 7.5mg  daily, carvedilol 12.5mg  BID, olmesartan 40mg  daily, and HCTZ 25mg  daily.  2. Muscle pain - Resolved after stopping her atorvastatin 40mg  daily. I changed her to equivalent dose of rosuvastatin 20mg  daily on 12/9 which she has been tolerating well. She sees her PCP in February and will have her recheck lipids.   Alvis Pulcini E. Langston Tuberville, PharmD, BCACP, [facility] [address], [place], [place] [address] Phone: [phone]; Fax: [phone] 10/29/2021 7:49 AM

## 2021-10-30 ENCOUNTER — Telehealth: Payer: Self-pay | Admitting: Pharmacist

## 2021-10-30 LAB — BASIC METABOLIC PANEL
BUN/Creatinine Ratio: 18 (ref 12–28)
BUN: 12 mg/dL (ref 8–27)
CO2: 28 mmol/L (ref 20–29)
Calcium: 9.1 mg/dL (ref 8.7–10.3)
Chloride: 98 mmol/L (ref 96–106)
Creatinine, Ser: 0.68 mg/dL (ref 0.57–1.00)
Glucose: 83 mg/dL (ref 70–99)
Potassium: 3.8 mmol/L (ref 3.5–5.2)
Sodium: 137 mmol/L (ref 134–144)
eGFR: 91 mL/min/{1.73_m2} (ref >59)

## 2021-10-30 MED ORDER — AMLODIPINE BESYLATE 5 MG PO TABS: 5.0000 mg | ORAL_TABLET | Freq: Every day | ORAL | 3 refills | Status: DC

## 2021-10-30 NOTE — Telephone Encounter (Signed)
BMET stable, Na improved after changing from chlorthalidone 12.5mg  to HCTZ 25mg  daily. Will decrease amlodipine from 7.5mg  to 5mg  daily to help resolve mild LE edema. Pt aware to of lab results, med plan, and to monitor BP at home using her husband's cuff and will let us know if BP increases above 130/19mmHg on a regular basis.

## 2021-11-26 ENCOUNTER — Other Ambulatory Visit: Payer: Self-pay | Admitting: Interventional Cardiology

## 2021-12-31 ENCOUNTER — Ambulatory Visit (INDEPENDENT_AMBULATORY_CARE_PROVIDER_SITE_OTHER): Payer: Medicare HMO | Admitting: Family Medicine

## 2021-12-31 ENCOUNTER — Encounter: Payer: Self-pay | Admitting: Family Medicine

## 2021-12-31 VITALS — BP 124/70 | HR 57 | Temp 97.8°F | Resp 16 | Ht 62.0 in | Wt 115.8 lb

## 2021-12-31 DIAGNOSIS — E785 Hyperlipidemia, unspecified: Secondary | ICD-10-CM

## 2021-12-31 DIAGNOSIS — Z0000 Encounter for general adult medical examination without abnormal findings: Secondary | ICD-10-CM | POA: Diagnosis not present

## 2021-12-31 DIAGNOSIS — I10 Essential (primary) hypertension: Secondary | ICD-10-CM | POA: Diagnosis not present

## 2021-12-31 NOTE — Progress Notes (Addendum)
Subjective:   By signing my name below, I, Shehryar Baig, attest that this documentation has been prepared under the direction and in the presence of Ann Held, DO  12/31/2021      Patient ID: Natasha Trujillo, female    DOB: 03-15-1946, 76 y.o.   MRN: [id number]  Chief Complaint  Patient presents with   Annual Exam    Pt states fasting     HPI Patient is in today for a comprehensive physical exam.   She is UTD on medication refills.  She is fasting at this time and is completing lab work during this visit.  She has an upcomming mammogram appointment in one week on 01/07/2022.  She reports having an bone density scan a couple of months ago. She reports find osteopenia during her last bone density scan.  She continues seeing her GYN specialist, Dr. Helane Rima, and completes are pap smears. Mammograms, and bone densities at her office.  She denies having any fever, ear pain, muscle pain, new joint pain, new moles, congestion, sinus pain, sore throat, eye pain, chest pain, palpations, cough, SOB, wheezing, n/v/d, constipation, blood in stool, dysuria, frequency, hematuria, or headaches at this time. She reports her older brother had a aortic valve replacement procedure last week, otherwise she has no changes to her family medical history. She has no recent surgical procedures.  She received the pfizer bivalent Covid-19 vaccine on 11/03/2021. She is UTD on shingles, tetanus, and pneumonia vaccine. She is UTD on vision and dental care.   Past Medical History:  Diagnosis Date   Hyperlipidemia    Hypertension    Osteopenia    Osteoporosis     Past Surgical History:  Procedure Laterality Date   BLEPHAROPLASTY Bilateral 08/2016   Dr Shelbie Proctor   KNEE SURGERY Left 09/12/13   SHOULDER SURGERY Right 01/05/2017   Dr.Caffrey   TUBAL LIGATION      Family History  Problem Relation Age of Onset   Dementia Mother    Hypertension Mother    Heart disease Mother 53        CABG   Hypertension Father    Heart disease Father 49       MI   Diabetes Brother 81       type 1   Hypertension Brother    Heart disease Brother        cabg   Cancer Maternal Aunt 68       cervical   Diabetes Other        committed suicide   Coronary artery disease Other        female 1st degree relative <50/female 1st degree relative <60   Cervical cancer Other    Lung cancer Other    Heart attack Son 56   Colon cancer Neg Hx    Esophageal cancer Neg Hx    Rectal cancer Neg Hx    Stomach cancer Neg Hx     Social History   Socioeconomic History   Marital status: Married    Spouse name: Not on file   Number of children: Not on file   Years of education: Not on file   Highest education level: Not on file  Occupational History   Occupation: retired    Fish farm manager: RETIRED  Tobacco Use   Smoking status: Never   Smokeless tobacco: Never  Vaping Use   Vaping Use: Never used  Substance and Sexual Activity   Alcohol use: Yes    Comment:  occasional   Drug use: No   Sexual activity: Yes    Partners: Male  Other Topics Concern   Not on file  Social History Narrative   Exercise--walk 4 miles 5 days a week   Social Determinants of Health   Financial Resource Strain: Not on file  Food Insecurity: Not on file  Transportation Needs: Not on file  Physical Activity: Not on file  Stress: Not on file  Social Connections: Not on file  Intimate Partner Violence: Not on file    Outpatient Medications Prior to Visit  Medication Sig Dispense Refill   amLODipine (NORVASC) 5 MG tablet Take 1 tablet (5 mg total) by mouth daily. 90 tablet 3   aspirin 81 MG EC tablet Take 81 mg by mouth daily.     CALCIUM PO Take by mouth. Pt states taking 1000 MG daily     carvedilol (COREG) 12.5 MG tablet TAKE 1 TABLET TWICE DAILY 180 tablet 3   hydrochlorothiazide (HYDRODIURIL) 25 MG tablet Take 1 tablet (25 mg total) by mouth daily. 30 tablet 5   olmesartan (BENICAR) 40 MG tablet TAKE 1 TABLET  EVERY DAY 90 tablet 0   rosuvastatin (CRESTOR) 20 MG tablet Take 1 tablet (20 mg total) by mouth daily. 30 tablet 5   VITAMIN D PO Take by mouth. Pt states taking 600 g daily     No facility-administered medications prior to visit.    Allergies  Allergen Reactions   Lisinopril Other (See Comments)    Throat swelling   Adrenalin [Epinephrine] Other (See Comments)    dizziness   Amlodipine     LE edema on 59m dose, tolerates 549mdose well   Atorvastatin     Cramping in legs on 4054mose    Review of Systems  Constitutional:  Negative for fever.  HENT:  Negative for congestion and sore throat.   Respiratory:  Negative for cough, shortness of breath and wheezing.   Cardiovascular:  Negative for chest pain.  Gastrointestinal:  Negative for blood in stool, constipation, diarrhea, nausea and vomiting.  Genitourinary:  Negative for dysuria, frequency and hematuria.  Musculoskeletal:  Negative for joint pain and myalgias.  Skin:        (-)New moles  Neurological:  Negative for headaches.      Objective:    Physical Exam Constitutional:      General: She is not in acute distress.    Appearance: Normal appearance. She is not ill-appearing.  HENT:     Head: Normocephalic and atraumatic.     Right Ear: Tympanic membrane, ear canal and external ear normal.     Left Ear: Tympanic membrane, ear canal and external ear normal.  Eyes:     Extraocular Movements: Extraocular movements intact.     Pupils: Pupils are equal, round, and reactive to light.  Cardiovascular:     Rate and Rhythm: Normal rate and regular rhythm.     Heart sounds: Normal heart sounds. No murmur heard.   No gallop.  Pulmonary:     Effort: Pulmonary effort is normal. No respiratory distress.     Breath sounds: Normal breath sounds. No wheezing or rales.  Abdominal:     General: Bowel sounds are normal. There is no distension.     Palpations: Abdomen is soft.     Tenderness: There is no abdominal tenderness.  There is no guarding.  Skin:    General: Skin is warm and dry.  Neurological:     Mental Status:  She is alert and oriented to person, place, and time.  Psychiatric:        Behavior: Behavior normal.    BP 124/70 (BP Location: Right Arm, Patient Position: Sitting, Cuff Size: Normal)    Pulse (!) 57    Temp 97.8 F (36.6 C) (Oral)    Resp 16    Ht 5' 2"  (1.575 m)    Wt 115 lb 12.8 oz (52.5 kg)    SpO2 99%    BMI 21.18 kg/m  Wt Readings from Last 3 Encounters:  12/31/21 115 lb 12.8 oz (52.5 kg)  09/22/21 119 lb 9.6 oz (54.3 kg)  12/30/20 117 lb 12.8 oz (53.4 kg)    Diabetic Foot Exam - Simple   No data filed    Lab Results  Component Value Date   WBC 4.9 12/30/2020   HGB 12.6 12/30/2020   HCT 36.7 12/30/2020   PLT 290.0 12/30/2020   GLUCOSE 83 10/29/2021   CHOL 142 12/30/2020   TRIG 46.0 12/30/2020   HDL 81.50 12/30/2020   LDLCALC 51 12/30/2020   ALT 17 12/30/2020   AST 20 12/30/2020   NA 137 10/29/2021   K 3.8 10/29/2021   CL 98 10/29/2021   CREATININE 0.68 10/29/2021   BUN 12 10/29/2021   CO2 28 10/29/2021   TSH 1.68 05/27/2017   HGBA1C 5.9 03/06/2018   MICROALBUR 0.50 05/10/2013    Lab Results  Component Value Date   TSH 1.68 05/27/2017   Lab Results  Component Value Date   WBC 4.9 12/30/2020   HGB 12.6 12/30/2020   HCT 36.7 12/30/2020   MCV 94.1 12/30/2020   PLT 290.0 12/30/2020   Lab Results  Component Value Date   NA 137 10/29/2021   K 3.8 10/29/2021   CO2 28 10/29/2021   GLUCOSE 83 10/29/2021   BUN 12 10/29/2021   CREATININE 0.68 10/29/2021   BILITOT 0.6 12/30/2020   ALKPHOS 62 12/30/2020   AST 20 12/30/2020   ALT 17 12/30/2020   PROT 7.1 12/30/2020   ALBUMIN 4.6 12/30/2020   CALCIUM 9.1 10/29/2021   EGFR 91 10/29/2021   GFR 89.00 12/30/2020   Lab Results  Component Value Date   CHOL 142 12/30/2020   Lab Results  Component Value Date   HDL 81.50 12/30/2020   Lab Results  Component Value Date   LDLCALC 51 12/30/2020   Lab  Results  Component Value Date   TRIG 46.0 12/30/2020   Lab Results  Component Value Date   CHOLHDL 2 12/30/2020   Lab Results  Component Value Date   HGBA1C 5.9 03/06/2018   Mammogram- Last completed 08/09/2019.  Dexa- Last completed 08/19/2015. Result showed she has osteopenia. Repeat in 2 years. Colonoscopy- Last completed 03/28/2019. Results showed: - One diminutive polyp in the transverse colon, removed with a cold snare. Resected and retrieved. - Ulcerated mucosa in the sigmoid colon. Biopsied. - Mild diverticulosis in the sigmoid colon. - The examination was otherwise normal on direct and retroflexion views. - Personal history of colonic polyps. 12 mm adenoma removed 2003 - none since until today Otherwise results are normal. Repeat in 5 years.      Assessment & Plan:   Problem List Items Addressed This Visit       Unprioritized   Essential hypertension    Well controlled, no changes to meds. Encouraged heart healthy diet such as the DASH diet and exercise as tolerated.       Hyperlipidemia LDL goal <70  Encourage heart healthy diet such as MIND or DASH diet, increase exercise, avoid trans fats, simple carbohydrates and processed foods, consider a krill or fish or flaxseed oil cap daily.       Preventative health care    ghm utd Check labs  See avs      Other Visit Diagnoses     Primary hypertension    -  Primary   Relevant Orders   Lipid panel   Comprehensive metabolic panel   CBC with Differential/Platelet   Hyperlipidemia, unspecified hyperlipidemia type       Relevant Orders   Lipid panel   Comprehensive metabolic panel   CBC with Differential/Platelet        No orders of the defined types were placed in this encounter.   I, Ann Held, DO, personally preformed the services described in this documentation.  All medical record entries made by the scribe were at my direction and in my presence.  I have reviewed the chart and  discharge instructions (if applicable) and agree that the record reflects my personal performance and is accurate and complete. 12/31/2021   I,Shehryar Baig,acting as a scribe for Ann Held, DO.,have documented all relevant documentation on the behalf of Ann Held, DO,as directed by  Ann Held, DO while in the presence of Ann Held, DO.   Ann Held, DO

## 2021-12-31 NOTE — Assessment & Plan Note (Signed)
Encourage heart healthy diet such as MIND or DASH diet, increase exercise, avoid trans fats, simple carbohydrates and processed foods, consider a krill or fish or flaxseed oil cap daily.  °

## 2021-12-31 NOTE — Assessment & Plan Note (Signed)
Well controlled, no changes to meds. Encouraged heart healthy diet such as the DASH diet and exercise as tolerated.  °

## 2021-12-31 NOTE — Progress Notes (Signed)
Cardiology Office Note:    Date:  01/01/2022   ID:  Natasha Trujillo, DOB [birth year], MRN [id number]  PCP:  Carollee Herter, Alferd Apa, DO  Cardiologist:  None   Referring MD: Carollee Herter, Alferd Apa, *   Chief Complaint  Patient presents with   Hypertension   Coronary Artery Disease    History of Present Illness:    Natasha Trujillo is a 76 y.o. female with a hx of recurring chest pain, DOE,  essential hypertension, family h/o premature CAD, asymptomatic CAD with elevated coronary calcium score, and primary hypertension.   She is asymptomatic.  She has noted relatively low blood pressures after being managed in the hypertension clinic.  Blood pressures have ranged between 82/50 mmHg to as high as 124/70 on her home monitor.  Average blood pressures appear to be around 105/60 mmHg.  No medication side effects.  Past Medical History:  Diagnosis Date   Hyperlipidemia    Hypertension    Osteopenia    Osteoporosis     Past Surgical History:  Procedure Laterality Date   BLEPHAROPLASTY Bilateral 08/2016   Dr Shelbie Proctor   KNEE SURGERY Left 09/12/13   SHOULDER SURGERY Right 01/05/2017   Dr.Caffrey   TUBAL LIGATION      Current Medications: Current Meds  Medication Sig   amLODipine (NORVASC) 5 MG tablet Take 1 tablet (5 mg total) by mouth daily.   ammonium lactate (AMLACTIN) 12 % cream Apply topically daily.   aspirin 81 MG EC tablet Take 81 mg by mouth daily.   CALCIUM PO Take by mouth. Pt states taking 1000 MG daily   carvedilol (COREG) 12.5 MG tablet TAKE 1 TABLET TWICE DAILY   olmesartan (BENICAR) 40 MG tablet TAKE 1 TABLET EVERY DAY   rosuvastatin (CRESTOR) 20 MG tablet Take 1 tablet (20 mg total) by mouth daily.   VITAMIN D PO Take by mouth. Pt states taking 600 g daily   [DISCONTINUED] hydrochlorothiazide (HYDRODIURIL) 25 MG tablet Take 1 tablet (25 mg total) by mouth daily.     Allergies:   Lisinopril, Adrenalin [epinephrine], Amlodipine, and Atorvastatin    Social History   Socioeconomic History   Marital status: Married    Spouse name: Not on file   Number of children: Not on file   Years of education: Not on file   Highest education level: Not on file  Occupational History   Occupation: retired    Fish farm manager: RETIRED  Tobacco Use   Smoking status: Never   Smokeless tobacco: Never  Vaping Use   Vaping Use: Never used  Substance and Sexual Activity   Alcohol use: Yes    Comment: occasional   Drug use: No   Sexual activity: Yes    Partners: Male  Other Topics Concern   Not on file  Social History Narrative   Exercise--walk 4 miles 5 days a week   Social Determinants of Radio broadcast assistant Strain: Not on file  Food Insecurity: Not on file  Transportation Needs: Not on file  Physical Activity: Not on file  Stress: Not on file  Social Connections: Not on file     Family History: The patient's family history includes Cancer (age of onset: 56) in her maternal aunt; Cervical cancer in an other family member; Coronary artery disease in an other family member; Dementia in her mother; Diabetes in an other family member; Diabetes (age of onset: 53) in her brother; Heart attack (age of onset: 105) in her son;  Heart disease in her brother; Heart disease (age of onset: 100) in her father and mother; Hypertension in her brother, father, and mother; Lung cancer in an other family member. There is no history of Colon cancer, Esophageal cancer, Rectal cancer, or Stomach cancer.  ROS:   Please see the history of present illness.    She has no complaints.  She is serious about her health.  She also wore a cardiac complications.  She has this desire because of her family history.  All other systems reviewed and are negative.  EKGs/Labs/Other Studies Reviewed:    The following studies were reviewed today:  CORONARY CALCIUM SCORE 02/03/2021: IMPRESSION: Coronary calcium score of 173. This was 73rd percentile for age-, race-, and  sex-matched controls.  ECHOCARDIOGRAM 2019: Study Conclusions   - Left ventricle: The cavity size was normal. Systolic function was    normal. The estimated ejection fraction was in the range of 55%    to 60%. Wall motion was normal; there were no regional wall    motion abnormalities. Doppler parameters are consistent with    abnormal left ventricular relaxation (grade 1 diastolic    dysfunction). There was no evidence of elevated ventricular    filling pressure by Doppler parameters.  - Aortic valve: There was no regurgitation.  - Mitral valve: There was mild regurgitation.  - Right ventricle: The cavity size was normal. Wall thickness was    normal. Systolic function was normal.  - Right atrium: The atrium was normal in size.  - Tricuspid valve: There was mild regurgitation.  - Pulmonary arteries: Systolic pressure was mildly increased. PA    peak pressure: 38 mm Hg (S).  - Inferior vena cava: The vessel was normal in size.  - Pericardium, extracardiac: There was no pericardial effusion.   EKG:  EKG no new ECG.  Recent Labs: 10/29/2021: BUN 12; Creatinine, Ser 0.68; Potassium 3.8; Sodium 137  Recent Lipid Panel    Component Value Date/Time   CHOL 142 12/30/2020 1452   CHOL 140 09/13/2019 1001   TRIG 46.0 12/30/2020 1452   HDL 81.50 12/30/2020 1452   HDL 89 09/13/2019 1001   CHOLHDL 2 12/30/2020 1452   VLDL 9.2 12/30/2020 1452   LDLCALC 51 12/30/2020 1452   LDLCALC 41 09/13/2019 1001    Physical Exam:    VS:  BP (!) 96/58    Pulse (!) 57    Ht 5\' 2"  (1.575 m)    Wt 120 lb 3.2 oz (54.5 kg)    SpO2 90%    BMI 21.98 kg/m     Wt Readings from Last 3 Encounters:  01/01/22 120 lb 3.2 oz (54.5 kg)  12/31/21 115 lb 12.8 oz (52.5 kg)  09/22/21 119 lb 9.6 oz (54.3 kg)     GEN: Appears younger than stated age. No acute distress HEENT: Normal NECK: No JVD. LYMPHATICS: No lymphadenopathy CARDIAC: No murmur. RRR no gallop, or edema. VASCULAR:  Normal Pulses. No  bruits. RESPIRATORY:  Clear to auscultation without rales, wheezing or rhonchi  ABDOMEN: Soft, non-tender, non-distended, No pulsatile mass, MUSCULOSKELETAL: No deformity  SKIN: Warm and dry NEUROLOGIC:  Alert and oriented x 3 PSYCHIATRIC:  Normal affect   ASSESSMENT:    1. Hyperlipidemia LDL goal <100   2. Essential hypertension   3. Dyspnea on exertion   4. Family history of early CAD   66. Chest pain of uncertain etiology   6. Diastolic dysfunction    PLAN:    In order of  problems listed above:  Continue Crestor 20 mg daily.  Target LDL less than 70.  Most recent LDL was 51 in February 2022. Decrease HCTZ to 12.5 mg daily.  Continue to monitor blood pressure once per week.  Call if pressures are consistently above 140/80 mmHg. Resolved with good blood pressure control No other None Noted on echo.  Also note that with excellent blood pressure control dyspnea is not a problem.    Clinical follow-up in 1 year.  Call if blood pressures are consistently above 140/80 mmHg now that we have decreased HCTZ to 12.5 mg/day.   Medication Adjustments/Labs and Tests Ordered: Current medicines are reviewed at length with the patient today.  Concerns regarding medicines are outlined above.  No orders of the defined types were placed in this encounter.  Meds ordered this encounter  Medications   hydrochlorothiazide (HYDRODIURIL) 25 MG tablet    Sig: Take 0.5 tablets (12.5 mg total) by mouth daily.    Dispense:  30 tablet    Refill:  5    Patient Instructions  Medication Instructions:  1) DECREASE Hydrochlorothiazide to 12.5mg  once daily  *If you need a refill on your cardiac medications before your next appointment, please call your pharmacy*   Lab Work: None If you have labs (blood work) drawn today and your tests are completely normal, you will receive your results only by: [place] (if you have MyChart) OR A paper copy in the mail If you have any lab test that is  abnormal or we need to change your treatment, we will call you to review the results.   Testing/Procedures: None   Follow-Up: At [facility], you and your health needs are our priority.  As part of our continuing mission to provide you with exceptional heart care, we have created designated Provider Care Teams.  These Care Teams include your primary Cardiologist (physician) and Advanced Practice Providers (APPs -  Physician Assistants and Nurse Practitioners) who all work together to provide you with the care you need, when you need it.  We recommend signing up for the patient portal called "MyChart".  Sign up information is provided on this After Visit Summary.  MyChart is used to connect with patients for Virtual Visits (Telemedicine).  Patients are able to view lab/test results, encounter notes, upcoming appointments, etc.  Non-urgent messages can be sent to your provider as well.   To learn more about what you can do with MyChart, go to [email or web address].    Your next appointment:   1 year(s)  The format for your next appointment:   In Person  Provider:    [facility]. Blenda Bridegroom, MD   Other Instructions     Signed, Sinclair Grooms, MD  01/01/2022 9:23 AM    [place]

## 2021-12-31 NOTE — Patient Instructions (Signed)
Preventive Care 65 Years and Older, Female °Preventive care refers to lifestyle choices and visits with your health care provider that can promote health and wellness. Preventive care visits are also called wellness exams. °What can I expect for my preventive care visit? °Counseling °Your health care provider may ask you questions about your: °Medical history, including: °Past medical problems. °Family medical history. °Pregnancy and menstrual history. °History of falls. °Current health, including: °Memory and ability to understand (cognition). °Emotional well-being. °Home life and relationship well-being. °Sexual activity and sexual health. °Lifestyle, including: °Alcohol, nicotine or tobacco, and drug use. °Access to firearms. °Diet, exercise, and sleep habits. °Work and work environment. °Sunscreen use. °Safety issues such as seatbelt and bike helmet use. °Physical exam °Your health care provider will check your: °Height and weight. These may be used to calculate your BMI (body mass index). BMI is a measurement that tells if you are at a healthy weight. °Waist circumference. This measures the distance around your waistline. This measurement also tells if you are at a healthy weight and may help predict your risk of certain diseases, such as type 2 diabetes and high blood pressure. °Heart rate and blood pressure. °Body temperature. °Skin for abnormal spots. °What immunizations do I need? °Vaccines are usually given at various ages, according to a schedule. Your health care provider will recommend vaccines for you based on your age, medical history, and lifestyle or other factors, such as travel or where you work. °What tests do I need? °Screening °Your health care provider may recommend screening tests for certain conditions. This may include: °Lipid and cholesterol levels. °Hepatitis C test. °Hepatitis B test. °HIV (human immunodeficiency virus) test. °STI (sexually transmitted infection) testing, if you are at  risk. °Lung cancer screening. °Colorectal cancer screening. °Diabetes screening. This is done by checking your blood sugar (glucose) after you have not eaten for a while (fasting). °Mammogram. Talk with your health care provider about how often you should have regular mammograms. °BRCA-related cancer screening. This may be done if you have a family history of breast, ovarian, tubal, or peritoneal cancers. °Bone density scan. This is done to screen for osteoporosis. °Talk with your health care provider about your test results, treatment options, and if necessary, the need for more tests. °Follow these instructions at home: °Eating and drinking ° °Eat a diet that includes fresh fruits and vegetables, whole grains, lean protein, and low-fat dairy products. Limit your intake of foods with high amounts of sugar, saturated fats, and salt. °Take vitamin and mineral supplements as recommended by your health care provider. °Do not drink alcohol if your health care provider tells you not to drink. °If you drink alcohol: °Limit how much you have to 0-1 drink a day. °Know how much alcohol is in your drink. In the [place] one drink equals one 12 oz bottle of beer (355 mL), one 5 oz glass of wine (148 mL), or one 1½ oz glass of hard liquor (44 mL). °Lifestyle °Brush your teeth every morning and night with fluoride toothpaste. Floss one time each day. °Exercise for at least 30 minutes 5 or more days each week. °Do not use any products that contain nicotine or tobacco. These products include cigarettes, chewing tobacco, and vaping devices, such as e-cigarettes. If you need help quitting, ask your health care provider. °Do not use drugs. °If you are sexually active, practice safe sex. Use a condom or other form of protection in order to prevent STIs. °Take aspirin only as told by your   health care provider. Make sure that you understand how much to take and what form to take. Work with your health care provider to find out whether it  is safe and beneficial for you to take aspirin daily. Ask your health care provider if you need to take a cholesterol-lowering medicine (statin). Find healthy ways to manage stress, such as: Meditation, yoga, or listening to music. Journaling. Talking to a trusted person. Spending time with friends and family. Minimize exposure to UV radiation to reduce your risk of skin cancer. Safety Always wear your seat belt while driving or riding in a vehicle. Do not drive: If you have been drinking alcohol. Do not ride with someone who has been drinking. When you are tired or distracted. While texting. If you have been using any mind-altering substances or drugs. Wear a helmet and other protective equipment during sports activities. If you have firearms in your house, make sure you follow all gun safety procedures. What's next? Visit your health care provider once a year for an annual wellness visit. Ask your health care provider how often you should have your eyes and teeth checked. Stay up to date on all vaccines. This information is not intended to replace advice given to you by your health care provider. Make sure you discuss any questions you have with your health care provider. Document Revised: 04/22/2021 Document Reviewed: 04/22/2021 Elsevier Patient Education  [place].

## 2021-12-31 NOTE — Assessment & Plan Note (Signed)
ghm utd Check labs  See avs  

## 2022-01-01 ENCOUNTER — Ambulatory Visit: Payer: Medicare HMO | Admitting: Interventional Cardiology

## 2022-01-01 ENCOUNTER — Encounter: Payer: Self-pay | Admitting: Interventional Cardiology

## 2022-01-01 ENCOUNTER — Other Ambulatory Visit: Payer: Self-pay

## 2022-01-01 VITALS — BP 96/58 | HR 57 | Ht 62.0 in | Wt 120.2 lb

## 2022-01-01 DIAGNOSIS — I10 Essential (primary) hypertension: Secondary | ICD-10-CM

## 2022-01-01 DIAGNOSIS — Z8249 Family history of ischemic heart disease and other diseases of the circulatory system: Secondary | ICD-10-CM | POA: Diagnosis not present

## 2022-01-01 DIAGNOSIS — I5189 Other ill-defined heart diseases: Secondary | ICD-10-CM | POA: Diagnosis not present

## 2022-01-01 DIAGNOSIS — E785 Hyperlipidemia, unspecified: Secondary | ICD-10-CM

## 2022-01-01 DIAGNOSIS — R079 Chest pain, unspecified: Secondary | ICD-10-CM

## 2022-01-01 DIAGNOSIS — R0609 Other forms of dyspnea: Secondary | ICD-10-CM

## 2022-01-01 LAB — CBC WITH DIFFERENTIAL/PLATELET
Basophils Absolute: 0 10*3/uL (ref 0.0–0.1)
Basophils Relative: 0.3 % (ref 0.0–3.0)
Eosinophils Absolute: 0.5 10*3/uL (ref 0.0–0.7)
Eosinophils Relative: 10.2 % — ABNORMAL HIGH (ref 0.0–5.0)
HCT: 33.1 % — ABNORMAL LOW (ref 36.0–46.0)
Hemoglobin: 11.3 g/dL — ABNORMAL LOW (ref 12.0–15.0)
Lymphocytes Relative: 26.4 % (ref 12.0–46.0)
Lymphs Abs: 1.3 10*3/uL (ref 0.7–4.0)
MCHC: 34.1 g/dL (ref 30.0–36.0)
MCV: 94.6 fl (ref 78.0–100.0)
Monocytes Absolute: 0.6 10*3/uL (ref 0.1–1.0)
Monocytes Relative: 12.6 % — ABNORMAL HIGH (ref 3.0–12.0)
Neutro Abs: 2.6 10*3/uL (ref 1.4–7.7)
Neutrophils Relative %: 50.5 % (ref 43.0–77.0)
Platelets: 270 10*3/uL (ref 150.0–400.0)
RBC: 3.5 Mil/uL — ABNORMAL LOW (ref 3.87–5.11)
RDW: 12.9 % (ref 11.5–15.5)
WBC: 5.1 10*3/uL (ref 4.0–10.5)

## 2022-01-01 LAB — COMPREHENSIVE METABOLIC PANEL
ALT: 13 U/L (ref 0–35)
AST: 16 U/L (ref 0–37)
Albumin: 4.4 g/dL (ref 3.5–5.2)
Alkaline Phosphatase: 51 U/L (ref 39–117)
BUN: 11 mg/dL (ref 6–23)
CO2: 34 mEq/L — ABNORMAL HIGH (ref 19–32)
Calcium: 9.6 mg/dL (ref 8.4–10.5)
Chloride: 96 mEq/L (ref 96–112)
Creatinine, Ser: 0.67 mg/dL (ref 0.40–1.20)
GFR: 86.06 mL/min (ref >60.00)
Glucose, Bld: 93 mg/dL (ref 70–99)
Potassium: 3.8 mEq/L (ref 3.5–5.1)
Sodium: 133 mEq/L — ABNORMAL LOW (ref 135–145)
Total Bilirubin: 0.5 mg/dL (ref 0.2–1.2)
Total Protein: 6.4 g/dL (ref 6.0–8.3)

## 2022-01-01 LAB — LIPID PANEL
Cholesterol: 136 mg/dL (ref 0–200)
HDL: 69.7 mg/dL (ref >39.00)
LDL Cholesterol: 56 mg/dL (ref 0–99)
NonHDL: 66.09
Total CHOL/HDL Ratio: 2
Triglycerides: 52 mg/dL (ref 0.0–149.0)
VLDL: 10.4 mg/dL (ref 0.0–40.0)

## 2022-01-01 MED ORDER — HYDROCHLOROTHIAZIDE 25 MG PO TABS: 12.5000 mg | ORAL_TABLET | Freq: Every day | ORAL | 5 refills | Status: DC

## 2022-01-01 NOTE — Patient Instructions (Signed)
Medication Instructions:  1) DECREASE Hydrochlorothiazide to 12.5mg  once daily  *If you need a refill on your cardiac medications before your next appointment, please call your pharmacy*   Lab Work: None If you have labs (blood work) drawn today and your tests are completely normal, you will receive your results only by: [place] (if you have MyChart) OR A paper copy in the mail If you have any lab test that is abnormal or we need to change your treatment, we will call you to review the results.   Testing/Procedures: None   Follow-Up: At [facility], you and your health needs are our priority.  As part of our continuing mission to provide you with exceptional heart care, we have created designated Provider Care Teams.  These Care Teams include your primary Cardiologist (physician) and Advanced Practice Providers (APPs -  Physician Assistants and Nurse Practitioners) who all work together to provide you with the care you need, when you need it.  We recommend signing up for the patient portal called "MyChart".  Sign up information is provided on this After Visit Summary.  MyChart is used to connect with patients for Virtual Visits (Telemedicine).  Patients are able to view lab/test results, encounter notes, upcoming appointments, etc.  Non-urgent messages can be sent to your provider as well.   To learn more about what you can do with MyChart, go to [email or web address].    Your next appointment:   1 year(s)  The format for your next appointment:   In Person  Provider:    [facility]. Blenda Bridegroom, MD   Other Instructions

## 2022-01-05 ENCOUNTER — Encounter: Payer: Self-pay | Admitting: Family Medicine

## 2022-01-06 ENCOUNTER — Encounter: Payer: Self-pay | Admitting: Family Medicine

## 2022-01-07 DIAGNOSIS — Z1231 Encounter for screening mammogram for malignant neoplasm of breast: Secondary | ICD-10-CM | POA: Diagnosis not present

## 2022-01-13 ENCOUNTER — Encounter: Payer: Self-pay | Admitting: Family Medicine

## 2022-01-13 ENCOUNTER — Telehealth (INDEPENDENT_AMBULATORY_CARE_PROVIDER_SITE_OTHER): Payer: Medicare HMO | Admitting: Family

## 2022-01-13 DIAGNOSIS — J069 Acute upper respiratory infection, unspecified: Secondary | ICD-10-CM | POA: Diagnosis not present

## 2022-01-13 MED ORDER — BENZONATATE 100 MG PO CAPS: 100.0000 mg | ORAL_CAPSULE | Freq: Three times a day (TID) | ORAL | 0 refills | Status: DC | PRN

## 2022-01-13 NOTE — Patient Instructions (Addendum)
Add Mucinex twice daily. ?Do nasal saline rinses twice a day. ?Tylenol as need for pain. ?Add tessalon as needed for cough. ?Call if symptoms worsen or if not improved in 3-4 days.  ?

## 2022-01-13 NOTE — Progress Notes (Signed)
? ? ?MyChart Video Visit ? ? ? ?Virtual Visit via Video Note  ? ?This visit type was conducted due to national recommendations for restrictions regarding the COVID-19 Pandemic (e.g. social distancing) in an effort to limit this patient's exposure and mitigate transmission in our community. This patient is at least at moderate risk for complications without adequate follow up. This format is felt to be most appropriate for this patient at this time. Physical exam was limited by quality of the video and audio technology used for the visit. CMA was able to get the patient set up on a video visit. ? ?Patient location: Home Patient and provider in visit ?Provider location: Office ? ?I discussed the limitations of evaluation and management by telemedicine and the availability of in person appointments. The patient expressed understanding and agreed to proceed. ? ?Visit Date: 01/13/2022 ? ?Today's healthcare provider: Nance Pear, NP  ? ? ? ?Subjective:  ? ? Patient ID: Natasha Trujillo, female    DOB: [birth year], 76 y.o.   MRN: [id number] ? ?Chief Complaint  ?Patient presents with  ? Sore Throat  ?  Complains of sore throat that started 3-05, "better now" ?Tested negative for covid 3-06  ? Nasal Congestion  ?  Complains of congestion  ? Cough  ?  Complains of persistent cough, hoarseness   ? Fatigue  ?  Complains of feeling very tired  ? ? ?Sore Throat  ?Associated symptoms include congestion and coughing.  ?Cough ?Associated symptoms include a sore throat.  ?Patient is in today for a video visit.  ?Cough/congestion- She also complains of nasal congestion, cough, hoarse throat for the past 4 days. She tested negative for Covid-19 on 01/11/2022. She has 5 Covid-19 vaccines including the Bivalent vaccine. Her last vaccine was 11/03/2021. ?She also reports of severe sore throat since the weekend and found it mostly resolved 3 days ago. She describes the pain as "cutting".  ? ? ? ?Past Medical History:  ?Diagnosis  Date  ? Hyperlipidemia   ? Hypertension   ? Osteopenia   ? Osteoporosis   ? ? ?Past Surgical History:  ?Procedure Laterality Date  ? BLEPHAROPLASTY Bilateral 08/2016  ? Dr Shelbie Proctor  ? KNEE SURGERY Left 09/12/13  ? SHOULDER SURGERY Right 01/05/2017  ? Dr.Caffrey  ? TUBAL LIGATION    ? ? ?Family History  ?Problem Relation Age of Onset  ? Dementia Mother   ? Hypertension Mother   ? Heart disease Mother 104  ?     CABG  ? Hypertension Father   ? Heart disease Father 50  ?     MI  ? Diabetes Brother 16  ?     type 1  ? Hypertension Brother   ? Heart disease Brother   ?     cabg  ? Cancer Maternal Aunt 68  ?     cervical  ? Diabetes Other   ?     committed suicide  ? Coronary artery disease Other   ?     female 1st degree relative <50/female 1st degree relative <60  ? Cervical cancer Other   ? Lung cancer Other   ? Heart attack Son 46  ? Colon cancer Neg Hx   ? Esophageal cancer Neg Hx   ? Rectal cancer Neg Hx   ? Stomach cancer Neg Hx   ? ? ?Social History  ? ?Socioeconomic History  ? Marital status: Married  ?  Spouse name: Not on file  ? Number  of children: Not on file  ? Years of education: Not on file  ? Highest education level: Not on file  ?Occupational History  ? Occupation: retired  ?  Employer: RETIRED  ?Tobacco Use  ? Smoking status: Never  ? Smokeless tobacco: Never  ?Vaping Use  ? Vaping Use: Never used  ?Substance and Sexual Activity  ? Alcohol use: Yes  ?  Comment: occasional  ? Drug use: No  ? Sexual activity: Yes  ?  Partners: Male  ?Other Topics Concern  ? Not on file  ?Social History Narrative  ? Exercise--walk 4 miles 5 days a week  ? ?Social Determinants of Health  ? ?Financial Resource Strain: Not on file  ?Food Insecurity: Not on file  ?Transportation Needs: Not on file  ?Physical Activity: Not on file  ?Stress: Not on file  ?Social Connections: Not on file  ?Intimate Partner Violence: Not on file  ? ? ?Outpatient Medications Prior to Visit  ?Medication Sig Dispense Refill  ? amLODipine (NORVASC) 5  MG tablet Take 1 tablet (5 mg total) by mouth daily. 90 tablet 3  ? ammonium lactate (AMLACTIN) 12 % cream Apply topically daily.    ? aspirin 81 MG EC tablet Take 81 mg by mouth daily.    ? CALCIUM PO Take by mouth. Pt states taking 1000 MG daily    ? carvedilol (COREG) 12.5 MG tablet TAKE 1 TABLET TWICE DAILY 180 tablet 3  ? hydrochlorothiazide (HYDRODIURIL) 25 MG tablet Take 0.5 tablets (12.5 mg total) by mouth daily. 30 tablet 5  ? olmesartan (BENICAR) 40 MG tablet TAKE 1 TABLET EVERY DAY 90 tablet 0  ? rosuvastatin (CRESTOR) 20 MG tablet Take 1 tablet (20 mg total) by mouth daily. 30 tablet 5  ? VITAMIN D PO Take by mouth. Pt states taking 600 g daily    ? ?No facility-administered medications prior to visit.  ? ? ?Allergies  ?Allergen Reactions  ? Lisinopril Other (See Comments)  ?  Throat swelling  ? Adrenalin [Epinephrine] Other (See Comments)  ?  dizziness  ? Amlodipine   ?  LE edema on '10mg'$  dose, tolerates '5mg'$  dose well  ? Atorvastatin   ?  Cramping in legs on '40mg'$  dose  ? ? ?Review of Systems  ?HENT:  Positive for congestion and sore throat.   ?     (+)hoarse voice  ?Respiratory:  Positive for cough.   ? ?   ?Objective:  ?  ?Physical Exam ?Constitutional:   ?   Appearance: She is well-developed.  ?HENT:  ?   Mouth/Throat:  ?   Pharynx: No pharyngeal swelling, oropharyngeal exudate or posterior oropharyngeal erythema.  ?   Comments: Voice is hoarse ?Pulmonary:  ?   Effort: Pulmonary effort is normal.  ?Neurological:  ?   Mental Status: She is alert and oriented to person, place, and time.  ?Psychiatric:     ?   Mood and Affect: Mood normal.     ?   Behavior: Behavior normal.  ?   ?Assessment & Plan:  ? ?Problem List Items Addressed This Visit   ? ?  ? Unprioritized  ? Viral URI with cough - Primary  ?  Add Mucinex twice daily. ?Do nasal saline rinses twice a day. ?Tylenol as need for pain. ?Add tessalon as needed for cough. ?Call if symptoms worsen or if not improved in 3-4 days.  ?  ?  ? ? ? ?Meds ordered  this encounter  ?Medications  ? benzonatate (  TESSALON) 100 MG capsule  ?  Sig: Take 1 capsule (100 mg total) by mouth 3 (three) times daily as needed.  ?  Dispense:  30 capsule  ?  Refill:  0  ?  Order Specific Question:   Supervising Provider  ?  Answer:   Penni Homans A [[id number]]  ? ? ?I discussed the assessment and treatment plan with the patient. The patient was provided an opportunity to ask questions and all were answered. The patient agreed with the plan and demonstrated an understanding of the instructions. ?  ?The patient was advised to call back or seek an in-person evaluation if the symptoms worsen or if the condition fails to improve as anticipated. ? ?Engineering geologist as a Education administrator for [employer], NP.,have documented all relevant documentation on the behalf of Nance Pear, NP,as directed by  Nance Pear, NP while in the presence of Nance Pear, NP. ? ?I provided 20 minutes of face-to-face time during this encounter. ? ? ?Nance Pear, NP ?Archivist at [employer] ?[phone] (phone) ?[phone] (fax) ? ?[facility]  ?

## 2022-01-13 NOTE — Assessment & Plan Note (Signed)
Add Mucinex twice daily. ?Do nasal saline rinses twice a day. ?Tylenol as need for pain. ?Add tessalon as needed for cough. ?Call if symptoms worsen or if not improved in 3-4 days.  ?

## 2022-01-15 ENCOUNTER — Encounter: Payer: Self-pay | Admitting: Family Medicine

## 2022-01-15 MED ORDER — AZITHROMYCIN 250 MG PO TABS: ORAL_TABLET | ORAL | 0 refills | Status: AC

## 2022-01-31 ENCOUNTER — Encounter: Payer: Self-pay | Admitting: Interventional Cardiology

## 2022-02-25 ENCOUNTER — Ambulatory Visit (INDEPENDENT_AMBULATORY_CARE_PROVIDER_SITE_OTHER): Payer: Medicare HMO

## 2022-02-25 VITALS — Ht 62.0 in | Wt 116.0 lb

## 2022-02-25 DIAGNOSIS — Z0000 Encounter for general adult medical examination without abnormal findings: Secondary | ICD-10-CM

## 2022-02-25 NOTE — Progress Notes (Signed)
?I connected with Vergie Living today by telephone and verified that I am speaking with the correct person using two identifiers. ?Location patient: home ?Location provider: work ?Persons participating in the virtual visit: Marene, Gilliam LPN. ?  ?I discussed the limitations, risks, security and privacy concerns of performing an evaluation and management service by telephone and the availability of in person appointments. I also discussed with the patient that there may be a patient responsible charge related to this service. The patient expressed understanding and verbally consented to this telephonic visit.  ?  ?Interactive audio and video telecommunications were attempted between this provider and patient, however failed, due to patient having technical difficulties OR patient did not have access to video capability.  We continued and completed visit with audio only. ? ?  ? ?Vital signs may be patient reported or missing. ? ?Subjective:  ? Natasha Trujillo is a 76 y.o. female who presents for Medicare Annual (Subsequent) preventive examination. ? ?Review of Systems    ? ?Cardiac Risk Factors include: advanced age (>84mn, >>59women);dyslipidemia;hypertension ? ?   ?Objective:  ?  ?Today's Vitals  ? 02/25/22 0811  ?Weight: 116 lb (52.6 kg)  ?Height: '5\' 2"'$  (1.575 m)  ? ?Body mass index is 21.22 kg/m?. ? ? ?  02/25/2022  ?  8:15 AM 10/12/2019  ?  1:57 PM 06/16/2018  ? 11:28 AM 05/27/2017  ?  9:31 AM 01/25/2016  ? 10:03 AM 10/28/2015  ?  4:05 PM  ?Advanced Directives  ?Does Patient Have a Medical Advance Directive? Yes No No Yes No No  ?Type of [place]   Healthcare Power of Attorney    ?Does patient want to make changes to medical advance directive?      No - Patient declined  ?Copy of [place]? No - copy requested   Yes  No - copy requested  ?Would patient like information on creating a medical advance directive?   No - Patient declined Yes (MAU/Ambulatory/Procedural Areas - Information given)  No - patient declined information No - patient declined information  ? ? ?Current Medications (verified) ?Outpatient Encounter Medications as of 02/25/2022  ?Medication Sig  ? amLODipine (NORVASC) 5 MG tablet Take 1 tablet (5 mg total) by mouth daily.  ? ammonium lactate (AMLACTIN) 12 % cream Apply topically daily.  ? aspirin 81 MG EC tablet Take 81 mg by mouth daily.  ? CALCIUM PO Take by mouth. Pt states taking 1000 MG daily  ? carvedilol (COREG) 12.5 MG tablet TAKE 1 TABLET TWICE DAILY  ? hydrochlorothiazide (HYDRODIURIL) 25 MG tablet Take 0.5 tablets (12.5 mg total) by mouth daily.  ? olmesartan (BENICAR) 40 MG tablet TAKE 1 TABLET EVERY DAY  ? rosuvastatin (CRESTOR) 20 MG tablet Take 1 tablet (20 mg total) by mouth daily.  ? VITAMIN D PO Take by mouth. Pt states taking 600 g daily  ? benzonatate (TESSALON) 100 MG capsule Take 1 capsule (100 mg total) by mouth 3 (three) times daily as needed. (Patient not taking: Reported on 02/25/2022)  ? ?No facility-administered encounter medications on file as of 02/25/2022.  ? ? ?Allergies (verified) ?Lisinopril, Adrenalin [epinephrine], Amlodipine, and Atorvastatin  ? ?History: ?Past Medical History:  ?Diagnosis Date  ? Hyperlipidemia   ? Hypertension   ? Osteopenia   ? Osteoporosis   ? ?Past Surgical History:  ?Procedure Laterality Date  ? BLEPHAROPLASTY Bilateral 08/2016  ? Dr JShelbie Proctor ? KNEE  SURGERY Left 09/12/13  ? SHOULDER SURGERY Right 01/05/2017  ? Dr.Caffrey  ? TUBAL LIGATION    ? ?Family History  ?Problem Relation Age of Onset  ? Dementia Mother   ? Hypertension Mother   ? Heart disease Mother 69  ?     CABG  ? Hypertension Father   ? Heart disease Father 79  ?     MI  ? Diabetes Brother 16  ?     type 1  ? Hypertension Brother   ? Heart disease Brother   ?     cabg  ? Cancer Maternal Aunt 68  ?     cervical  ? Diabetes Other   ?     committed suicide  ? Coronary artery disease  Other   ?     female 1st degree relative <50/female 1st degree relative <60  ? Cervical cancer Other   ? Lung cancer Other   ? Heart attack Son 29  ? Colon cancer Neg Hx   ? Esophageal cancer Neg Hx   ? Rectal cancer Neg Hx   ? Stomach cancer Neg Hx   ? ?Social History  ? ?Socioeconomic History  ? Marital status: Married  ?  Spouse name: Not on file  ? Number of children: Not on file  ? Years of education: Not on file  ? Highest education level: Not on file  ?Occupational History  ? Occupation: retired  ?  Employer: RETIRED  ?Tobacco Use  ? Smoking status: Never  ? Smokeless tobacco: Never  ?Vaping Use  ? Vaping Use: Never used  ?Substance and Sexual Activity  ? Alcohol use: Yes  ?  Comment: occasional  ? Drug use: No  ? Sexual activity: Yes  ?  Partners: Male  ?Other Topics Concern  ? Not on file  ?Social History Narrative  ? Exercise--walk 4 miles 5 days a week  ? ?Social Determinants of Health  ? ?Financial Resource Strain: Low Risk   ? Difficulty of Paying Living Expenses: Not hard at all  ?Food Insecurity: No Food Insecurity  ? Worried About Charity fundraiser in the Last Year: Never true  ? Ran Out of Food in the Last Year: Never true  ?Transportation Needs: No Transportation Needs  ? Lack of Transportation (Medical): No  ? Lack of Transportation (Non-Medical): No  ?Physical Activity: Sufficiently Active  ? Days of Exercise per Week: 5 days  ? Minutes of Exercise per Session: 60 min  ?Stress: No Stress Concern Present  ? Feeling of Stress : Not at all  ?Social Connections: Not on file  ? ? ?Tobacco Counseling ?Counseling given: Not Answered ? ? ?Clinical Intake: ? ?Pre-visit preparation completed: Yes ? ?Pain : No/denies pain ? ?  ? ?Nutritional Status: BMI of 19-24  Normal ?Nutritional Risks: None ?Diabetes: No ? ?How often do you need to have someone help you when you read instructions, pamphlets, or other written materials from your doctor or pharmacy?: 1 - Never ?What is the last grade level you completed  in school?: 12th grade ? ?Diabetic? no ? ?Interpreter Needed?: No ? ?Information entered by :: NAllen LPN ? ? ?Activities of Daily Living ? ?  02/25/2022  ?  8:16 AM 11/12/2021  ? 10:56 AM  ?In your present state of health, do you have any difficulty performing the following activities:  ?Hearing? 0 0  ?Vision? 0 0  ?Difficulty concentrating or making decisions? 0 0  ?Walking or climbing stairs? 0 0  ?  Dressing or bathing? 0 0  ?Doing errands, shopping? 0 0  ?Preparing Food and eating ? N N  ?Using the Toilet? N N  ?In the past six months, have you accidently leaked urine? N N  ?Do you have problems with loss of bowel control? N N  ?Managing your Medications? N N  ?Managing your Finances? N N  ?Housekeeping or managing your Housekeeping? N N  ? ? ?Patient Care Team: ?Carollee Herter, Alferd Apa, DO as PCP - General ?Dian Queen, MD as Consulting Physician (Obstetrics and Gynecology) ?Memory Argue, MD as Referring Physician ?Awanda Mink, MD as Consulting Physician (Ophthalmology) ?Belva Crome, MD as Consulting Physician (Cardiology) ? ?Indicate any recent Medical Services you may have received from other than [facility] providers in the past year (date may be approximate). ? ?   ?Assessment:  ? This is a routine wellness examination for [place]. ? ?Hearing/Vision screen ?Vision Screening - Comments:: Regular eye exams, [employer] ? ?Dietary issues and exercise activities discussed: ?Current Exercise Habits: Home exercise routine, Type of exercise: walking, Time (Minutes): 60, Frequency (Times/Week): 5, Weekly Exercise (Minutes/Week): 300 ? ? Goals Addressed   ? ?  ?  ?  ?  ? This Visit's Progress  ?  Patient Stated     ?  02/25/2022, stay healthy ?  ? ?  ? ?Depression Screen ? ?  02/25/2022  ?  8:16 AM 12/31/2021  ?  1:34 PM 12/30/2020  ?  1:53 PM 10/12/2019  ?  2:02 PM 06/16/2018  ? 11:31 AM 01/02/2018  ?  1:20 PM 05/27/2017  ?  9:32 AM  ?PHQ 2/9 Scores  ?PHQ - 2 Score 0 0 0 0 0 0 0  ?  ?Fall Risk ? ?   02/25/2022  ?  8:16 AM 11/12/2021  ? 10:56 AM 12/30/2020  ?  1:53 PM 10/12/2019  ?  2:01 PM 06/16/2018  ? 11:31 AM  ?Fall Risk   ?Falls in the past year? 0 0 0 0 No  ?Number falls in past yr: 0  0    ?Injury with Fall?

## 2022-02-25 NOTE — Patient Instructions (Signed)
Natasha Trujillo , ?Thank you for taking time to come for your Medicare Wellness Visit. I appreciate your ongoing commitment to your health goals. Please review the following plan we discussed and let me know if I can assist you in the future.  ? ?Screening recommendations/referrals: ?Colonoscopy: completed 03/28/2019, due 03/27/2024 ?Mammogram: completed 01/2022 per patient ?Bone Density: completed 10/2021 per patient ?Recommended yearly ophthalmology/optometry visit for glaucoma screening and checkup ?Recommended yearly dental visit for hygiene and checkup ? ?Vaccinations: ?Influenza vaccine: due 06/08/2022 ?Pneumococcal vaccine: completed 06/20/2014 ?Tdap vaccine: completed 12/18/2019, due 12/17/2029 ?Shingles vaccine: completed    ?Covid-19: 11/03/2021, 05/29/2021, 10/05/2020, 01/01/2020, 12/06/2019 ? ?Advanced directives: Please bring a copy of your POA (Power of Attorney) and/or Living Will to your next appointment.  ? ?Conditions/risks identified: none ? ?Next appointment: Follow up in one year for your annual wellness visit  ? ? ?Preventive Care 40 Years and Older, Female ?Preventive care refers to lifestyle choices and visits with your health care provider that can promote health and wellness. ?What does preventive care include? ?A yearly physical exam. This is also called an annual well check. ?Dental exams once or twice a year. ?Routine eye exams. Ask your health care provider how often you should have your eyes checked. ?Personal lifestyle choices, including: ?Daily care of your teeth and gums. ?Regular physical activity. ?Eating a healthy diet. ?Avoiding tobacco and drug use. ?Limiting alcohol use. ?Practicing safe sex. ?Taking low-dose aspirin every day. ?Taking vitamin and mineral supplements as recommended by your health care provider. ?What happens during an annual well check? ?The services and screenings done by your health care provider during your annual well check will depend on your age, overall health,  lifestyle risk factors, and family history of disease. ?Counseling  ?Your health care provider may ask you questions about your: ?Alcohol use. ?Tobacco use. ?Drug use. ?Emotional well-being. ?Home and relationship well-being. ?Sexual activity. ?Eating habits. ?History of falls. ?Memory and ability to understand (cognition). ?Work and work Statistician. ?Reproductive health. ?Screening  ?You may have the following tests or measurements: ?Height, weight, and BMI. ?Blood pressure. ?Lipid and cholesterol levels. These may be checked every 5 years, or more frequently if you are over 62 years old. ?Skin check. ?Lung cancer screening. You may have this screening every year starting at age 72 if you have a 30-pack-year history of smoking and currently smoke or have quit within the past 15 years. ?Fecal occult blood test (FOBT) of the stool. You may have this test every year starting at age 20. ?Flexible sigmoidoscopy or colonoscopy. You may have a sigmoidoscopy every 5 years or a colonoscopy every 10 years starting at age 30. ?Hepatitis C blood test. ?Hepatitis B blood test. ?Sexually transmitted disease (STD) testing. ?Diabetes screening. This is done by checking your blood sugar (glucose) after you have not eaten for a while (fasting). You may have this done every 1-3 years. ?Bone density scan. This is done to screen for osteoporosis. You may have this done starting at age 9. ?Mammogram. This may be done every 1-2 years. Talk to your health care provider about how often you should have regular mammograms. ?Talk with your health care provider about your test results, treatment options, and if necessary, the need for more tests. ?Vaccines  ?Your health care provider may recommend certain vaccines, such as: ?Influenza vaccine. This is recommended every year. ?Tetanus, diphtheria, and acellular pertussis (Tdap, Td) vaccine. You may need a Td booster every 10 years. ?Zoster vaccine. You may need this after  age  53. ?Pneumococcal 13-valent conjugate (PCV13) vaccine. One dose is recommended after age 9. ?Pneumococcal polysaccharide (PPSV23) vaccine. One dose is recommended after age 16. ?Talk to your health care provider about which screenings and vaccines you need and how often you need them. ?This information is not intended to replace advice given to you by your health care provider. Make sure you discuss any questions you have with your health care provider. ?Document Released: 11/21/2015 Document Revised: 07/14/2016 Document Reviewed: 08/26/2015 ?Elsevier Interactive Patient Education ? 2017 [place]. ? ?Fall Prevention in the Home ?Falls can cause injuries. They can happen to people of all ages. There are many things you can do to make your home safe and to help prevent falls. ?What can I do on the outside of my home? ?Regularly fix the edges of walkways and driveways and fix any cracks. ?Remove anything that might make you trip as you walk through a door, such as a raised step or threshold. ?Trim any bushes or trees on the path to your home. ?Use bright outdoor lighting. ?Clear any walking paths of anything that might make someone trip, such as rocks or tools. ?Regularly check to see if handrails are loose or broken. Make sure that both sides of any steps have handrails. ?Any raised decks and porches should have guardrails on the edges. ?Have any leaves, snow, or ice cleared regularly. ?Use sand or salt on walking paths during winter. ?Clean up any spills in your garage right away. This includes oil or grease spills. ?What can I do in the bathroom? ?Use night lights. ?Install grab bars by the toilet and in the tub and shower. Do not use towel bars as grab bars. ?Use non-skid mats or decals in the tub or shower. ?If you need to sit down in the shower, use a plastic, non-slip stool. ?Keep the floor dry. Clean up any water that spills on the floor as soon as it happens. ?Remove soap buildup in the tub or shower  regularly. ?Attach bath mats securely with double-sided non-slip rug tape. ?Do not have throw rugs and other things on the floor that can make you trip. ?What can I do in the bedroom? ?Use night lights. ?Make sure that you have a light by your bed that is easy to reach. ?Do not use any sheets or blankets that are too big for your bed. They should not hang down onto the floor. ?Have a firm chair that has side arms. You can use this for support while you get dressed. ?Do not have throw rugs and other things on the floor that can make you trip. ?What can I do in the kitchen? ?Clean up any spills right away. ?Avoid walking on wet floors. ?Keep items that you use a lot in easy-to-reach places. ?If you need to reach something above you, use a strong step stool that has a grab bar. ?Keep electrical cords out of the way. ?Do not use floor polish or wax that makes floors slippery. If you must use wax, use non-skid floor wax. ?Do not have throw rugs and other things on the floor that can make you trip. ?What can I do with my stairs? ?Do not leave any items on the stairs. ?Make sure that there are handrails on both sides of the stairs and use them. Fix handrails that are broken or loose. Make sure that handrails are as long as the stairways. ?Check any carpeting to make sure that it is firmly attached to the stairs.  Fix any carpet that is loose or worn. ?Avoid having throw rugs at the top or bottom of the stairs. If you do have throw rugs, attach them to the floor with carpet tape. ?Make sure that you have a light switch at the top of the stairs and the bottom of the stairs. If you do not have them, ask someone to add them for you. ?What else can I do to help prevent falls? ?Wear shoes that: ?Do not have high heels. ?Have rubber bottoms. ?Are comfortable and fit you well. ?Are closed at the toe. Do not wear sandals. ?If you use a stepladder: ?Make sure that it is fully opened. Do not climb a closed stepladder. ?Make sure that  both sides of the stepladder are locked into place. ?Ask someone to hold it for you, if possible. ?Clearly mark and make sure that you can see: ?Any grab bars or handrails. ?First and last steps. ?Where the edge of each step

## 2022-03-11 DIAGNOSIS — L814 Other melanin hyperpigmentation: Secondary | ICD-10-CM | POA: Diagnosis not present

## 2022-03-11 DIAGNOSIS — Z1283 Encounter for screening for malignant neoplasm of skin: Secondary | ICD-10-CM | POA: Diagnosis not present

## 2022-03-11 DIAGNOSIS — L910 Hypertrophic scar: Secondary | ICD-10-CM | POA: Diagnosis not present

## 2022-03-11 DIAGNOSIS — L821 Other seborrheic keratosis: Secondary | ICD-10-CM | POA: Diagnosis not present

## 2022-03-28 ENCOUNTER — Encounter: Payer: Self-pay | Admitting: Interventional Cardiology

## 2022-04-03 ENCOUNTER — Other Ambulatory Visit: Payer: Self-pay | Admitting: Interventional Cardiology

## 2022-04-07 DIAGNOSIS — H906 Mixed conductive and sensorineural hearing loss, bilateral: Secondary | ICD-10-CM | POA: Diagnosis not present

## 2022-04-07 DIAGNOSIS — H6980 Other specified disorders of Eustachian tube, unspecified ear: Secondary | ICD-10-CM | POA: Diagnosis not present

## 2022-04-13 DIAGNOSIS — Z8709 Personal history of other diseases of the respiratory system: Secondary | ICD-10-CM | POA: Diagnosis not present

## 2022-04-13 DIAGNOSIS — H6591 Unspecified nonsuppurative otitis media, right ear: Secondary | ICD-10-CM | POA: Diagnosis not present

## 2022-04-13 DIAGNOSIS — H72812 Multiple perforations of tympanic membrane, left ear: Secondary | ICD-10-CM | POA: Diagnosis not present

## 2022-04-13 DIAGNOSIS — Z974 Presence of external hearing-aid: Secondary | ICD-10-CM | POA: Diagnosis not present

## 2022-04-13 DIAGNOSIS — H6983 Other specified disorders of Eustachian tube, bilateral: Secondary | ICD-10-CM | POA: Diagnosis not present

## 2022-04-13 DIAGNOSIS — Z888 Allergy status to other drugs, medicaments and biological substances status: Secondary | ICD-10-CM | POA: Diagnosis not present

## 2022-04-13 DIAGNOSIS — H906 Mixed conductive and sensorineural hearing loss, bilateral: Secondary | ICD-10-CM | POA: Diagnosis not present

## 2022-04-13 DIAGNOSIS — H6590 Unspecified nonsuppurative otitis media, unspecified ear: Secondary | ICD-10-CM | POA: Diagnosis not present

## 2022-04-13 DIAGNOSIS — Z9622 Myringotomy tube(s) status: Secondary | ICD-10-CM | POA: Diagnosis not present

## 2022-04-13 DIAGNOSIS — H7292 Unspecified perforation of tympanic membrane, left ear: Secondary | ICD-10-CM | POA: Diagnosis not present

## 2022-05-16 ENCOUNTER — Encounter: Payer: Self-pay | Admitting: Interventional Cardiology

## 2022-05-16 ENCOUNTER — Other Ambulatory Visit: Payer: Self-pay | Admitting: Interventional Cardiology

## 2022-05-19 ENCOUNTER — Other Ambulatory Visit: Payer: Self-pay | Admitting: Interventional Cardiology

## 2022-06-01 DIAGNOSIS — S0191XA Laceration without foreign body of unspecified part of head, initial encounter: Secondary | ICD-10-CM | POA: Diagnosis not present

## 2022-06-03 DIAGNOSIS — H04123 Dry eye syndrome of bilateral lacrimal glands: Secondary | ICD-10-CM | POA: Diagnosis not present

## 2022-06-30 DIAGNOSIS — H6983 Other specified disorders of Eustachian tube, bilateral: Secondary | ICD-10-CM | POA: Diagnosis not present

## 2022-06-30 DIAGNOSIS — H7292 Unspecified perforation of tympanic membrane, left ear: Secondary | ICD-10-CM | POA: Diagnosis not present

## 2022-06-30 DIAGNOSIS — Z8249 Family history of ischemic heart disease and other diseases of the circulatory system: Secondary | ICD-10-CM | POA: Diagnosis not present

## 2022-06-30 DIAGNOSIS — Z833 Family history of diabetes mellitus: Secondary | ICD-10-CM | POA: Diagnosis not present

## 2022-06-30 DIAGNOSIS — H6993 Unspecified Eustachian tube disorder, bilateral: Secondary | ICD-10-CM | POA: Diagnosis not present

## 2022-06-30 DIAGNOSIS — H906 Mixed conductive and sensorineural hearing loss, bilateral: Secondary | ICD-10-CM | POA: Diagnosis not present

## 2022-06-30 DIAGNOSIS — H72812 Multiple perforations of tympanic membrane, left ear: Secondary | ICD-10-CM | POA: Diagnosis not present

## 2022-07-06 ENCOUNTER — Encounter: Payer: Self-pay | Admitting: Family Medicine

## 2022-07-08 ENCOUNTER — Ambulatory Visit (INDEPENDENT_AMBULATORY_CARE_PROVIDER_SITE_OTHER): Payer: Medicare HMO | Admitting: Family Medicine

## 2022-07-08 ENCOUNTER — Encounter: Payer: Self-pay | Admitting: Family Medicine

## 2022-07-08 VITALS — BP 142/82 | HR 56 | Temp 97.6°F | Resp 18 | Ht 62.0 in | Wt 118.0 lb

## 2022-07-08 DIAGNOSIS — M62838 Other muscle spasm: Secondary | ICD-10-CM | POA: Diagnosis not present

## 2022-07-08 MED ORDER — TIZANIDINE HCL 4 MG PO CAPS: 4.0000 mg | ORAL_CAPSULE | Freq: Three times a day (TID) | ORAL | 1 refills | Status: DC | PRN

## 2022-07-08 NOTE — Patient Instructions (Signed)
Muscle Cramps and Spasms Muscle cramps and spasms occur when a muscle or muscles tighten and you have no control over this tightening (involuntary muscle contraction). They are a common problem and can develop in any muscle. The most common place is in the calf muscles of the leg. Muscle cramps and muscle spasms are both involuntary muscle contractions, but there are some differences between the two: Muscle cramps are painful. They come and go and may last for a few seconds or up to 15 minutes. Muscle cramps are often more forceful and last longer than muscle spasms. Muscle spasms may or may not be painful. They may also last just a few seconds or much longer. Certain medical conditions, such as diabetes or Parkinson's disease, can make it more likely to develop cramps or spasms. However, cramps or spasms are usually not caused by a serious underlying problem. Common causes include: Doing more physical work or exercise than your body is ready for (overexertion). Overuse from repeating certain movements too many times. Remaining in a certain position for a long period of time. Improper preparation, form, or technique while playing a sport or doing an activity. Dehydration. Injury. Side effects of some medicines. Abnormally low levels of the salts and minerals in your blood (electrolytes), especially potassium and calcium. This could happen if you are taking water pills (diuretics) or if you are pregnant. In many cases, the cause of muscle cramps or spasms is not known. Follow these instructions at home: Managing pain and stiffness     Try massaging, stretching, and relaxing the affected muscle. Do this for several minutes at a time. If directed, apply heat to tight or tense muscles as often as told by your health care provider. Use the heat source that your health care provider recommends, such as a moist heat pack or a heating pad. Place a towel between your skin and the heat source. Leave the  heat on for 20-30 minutes. Remove the heat if your skin turns bright red. This is especially important if you are unable to feel pain, heat, or cold. You may have a greater risk of getting burned. If directed, put ice on the affected area. This may help if you are sore or have pain after a cramp or spasm. Put ice in a plastic bag. Place a towel between your skin and the bag. Leave the ice on for 20 minutes, 2-3 times a day. Try taking hot showers or baths to help relax tight muscles. Eating and drinking Drink enough fluid to keep your urine pale yellow. Staying well hydrated may help prevent cramps or spasms. Eat a healthy diet that includes plenty of nutrients to help your muscles function. A healthy diet includes fruits and vegetables, lean protein, whole grains, and low-fat or nonfat dairy products. General instructions If you are having frequent cramps, avoid intense exercise for several days. Take over-the-counter and prescription medicines only as told by your health care provider. Pay attention to any changes in your symptoms. Keep all follow-up visits as told by your health care provider. This is important. Contact a health care provider if: Your cramps or spasms get more severe or happen more often. Your cramps or spasms do not improve over time. Summary Muscle cramps and spasms occur when a muscle or muscles tighten and you have no control over this tightening (involuntary muscle contraction). The most common place for cramps or spasms to occur is in the calf muscles of the leg. Massaging, stretching, and relaxing the affected   muscle may relieve the cramp or spasm. Drink enough fluid to keep your urine pale yellow. Staying well hydrated may help prevent cramps or spasms. This information is not intended to replace advice given to you by your health care provider. Make sure you discuss any questions you have with your health care provider. Document Revised: 05/15/2021 Document  Reviewed: 05/15/2021 Elsevier Patient Education  2023 [employer]  

## 2022-07-08 NOTE — Progress Notes (Signed)
Established Patient Office Visit  Subjective   Patient ID: Natasha Trujillo, female    DOB: 17-Jun-1946  Age: 76 y.o. MRN: [id number]  Chief Complaint  Patient presents with   Back Pain    X1 week, pt states waking up with pain. Upper left shoulder blade. No falls or injuries.     HPIL  Pt is here c/o Left sided shoulder blade pain since 8/21.   No known injury.  No otc meds  It is not getting worse but it is not improving.  Heat seems to help.    Patient Active Problem List   Diagnosis Date Noted   Muscle spasm of left shoulder 07/08/2022   Viral URI with cough 01/13/2022   Hx of adenomatous colonic polyps 04/03/2019   Family history of early CAD 01/24/2018   Strain of right foot 01/03/2017   Right foot pain 12/29/2016   Preventative health care 12/05/2016   Right knee pain 02/03/2015   OSTEOPENIA 03/03/2010   POSTMENOPAUSAL STATUS 07/26/2008   Hyperlipidemia LDL goal <70 02/28/2007   Essential hypertension 02/28/2007   COLONOSCOPY, HX OF 02/28/2007   BREAST BIOPSY, HX OF 02/28/2007   Past Medical History:  Diagnosis Date   Hyperlipidemia    Hypertension    Osteopenia    Osteoporosis    Past Surgical History:  Procedure Laterality Date   BLEPHAROPLASTY Bilateral 08/2016   Dr Shelbie Proctor   KNEE SURGERY Left 09/12/13   SHOULDER SURGERY Right 01/05/2017   Dr.Caffrey   TUBAL LIGATION     Social History   Tobacco Use   Smoking status: Never   Smokeless tobacco: Never  Vaping Use   Vaping Use: Never used  Substance Use Topics   Alcohol use: Yes    Comment: occasional   Drug use: No   Social History   Socioeconomic History   Marital status: Married    Spouse name: Not on file   Number of children: Not on file   Years of education: Not on file   Highest education level: Not on file  Occupational History   Occupation: retired    Fish farm manager: RETIRED  Tobacco Use   Smoking status: Never   Smokeless tobacco: Never  Vaping Use   Vaping Use: Never used   Substance and Sexual Activity   Alcohol use: Yes    Comment: occasional   Drug use: No   Sexual activity: Yes    Partners: Male  Other Topics Concern   Not on file  Social History Narrative   Exercise--walk 4 miles 5 days a week   Social Determinants of Health   Financial Resource Strain: Low Risk  (02/25/2022)   Overall Financial Resource Strain (CARDIA)    Difficulty of Paying Living Expenses: Not hard at all  Food Insecurity: No Food Insecurity (02/25/2022)   Hunger Vital Sign    Worried About Running Out of Food in the Last Year: Never true    [place] in the Last Year: Never true  Transportation Needs: No Transportation Needs (02/25/2022)   PRAPARE - Hydrologist (Medical): No    Lack of Transportation (Non-Medical): No  Physical Activity: Sufficiently Active (02/25/2022)   Exercise Vital Sign    Days of Exercise per Week: 5 days    Minutes of Exercise per Session: 60 min  Stress: No Stress Concern Present (02/25/2022)   [place]    Feeling of Stress :  Not at all  Social Connections: Not on file  Intimate Partner Violence: Not on file   Family Status  Relation Name Status   Mother  Deceased   Father  Deceased   Brother  Alive   Mat Aunt  Deceased   Other nephew Deceased   Other  (Not Specified)   Other  (Not Specified)   Other  (Not Specified)   Son  Alive   Neg Hx  (Not Specified)   Family History  Problem Relation Age of Onset   Dementia Mother    Hypertension Mother    Heart disease Mother 29       CABG   Hypertension Father    Heart disease Father 36       MI   Diabetes Brother 65       type 1   Hypertension Brother    Heart disease Brother        cabg   Cancer Maternal Aunt 68       cervical   Diabetes Other        committed suicide   Coronary artery disease Other        female 1st degree relative <50/female 1st degree relative <60   Cervical  cancer Other    Lung cancer Other    Heart attack Son 45   Colon cancer Neg Hx    Esophageal cancer Neg Hx    Rectal cancer Neg Hx    Stomach cancer Neg Hx    Allergies  Allergen Reactions   Lisinopril Other (See Comments)    Throat swelling   Adrenalin [Epinephrine] Other (See Comments)    dizziness   Amlodipine     LE edema on 88m dose, tolerates 557mdose well   Atorvastatin     Cramping in legs on 4072mose      Review of Systems  Constitutional:  Negative for fever and malaise/fatigue.  HENT:  Negative for congestion.   Eyes:  Negative for blurred vision.  Respiratory:  Negative for cough and shortness of breath.   Cardiovascular:  Negative for chest pain, palpitations and leg swelling.  Gastrointestinal:  Negative for vomiting.  Musculoskeletal:  Positive for back pain. Negative for falls.  Skin:  Negative for rash.  Neurological:  Negative for loss of consciousness and headaches.      Objective:     BP (!) 142/82 (BP Location: Left Arm, Patient Position: Sitting, Cuff Size: Normal)   Pulse (!) 56   Temp 97.6 F (36.4 C) (Oral)   Resp 18   Ht 5' 2"  (1.575 m)   Wt 118 lb (53.5 kg)   SpO2 99%   BMI 21.58 kg/m  BP Readings from Last 3 Encounters:  07/08/22 (!) 142/82  01/01/22 (!) 96/58  12/31/21 124/70   Wt Readings from Last 3 Encounters:  07/08/22 118 lb (53.5 kg)  02/25/22 116 lb (52.6 kg)  01/01/22 120 lb 3.2 oz (54.5 kg)   SpO2 Readings from Last 3 Encounters:  07/08/22 99%  01/01/22 90%  12/31/21 99%      Physical Exam Vitals and nursing note reviewed.  Constitutional:      Appearance: She is well-developed.  HENT:     Head: Normocephalic and atraumatic.  Eyes:     Conjunctiva/sclera: Conjunctivae normal.  Neck:     Thyroid: No thyromegaly.     Vascular: No carotid bruit or JVD.  Cardiovascular:     Rate and Rhythm: Normal rate and regular rhythm.  Heart sounds: Normal heart sounds. No murmur heard. Pulmonary:     Effort:  Pulmonary effort is normal. No respiratory distress.     Breath sounds: Normal breath sounds. No wheezing or rales.  Chest:     Chest wall: No tenderness.  Musculoskeletal:        General: Tenderness present. No signs of injury.     Cervical back: Normal range of motion and neck supple.     Right lower leg: No edema.     Left lower leg: No edema.  Neurological:     General: No focal deficit present.     Mental Status: She is alert and oriented to person, place, and time.     Sensory: No sensory deficit.     Motor: No weakness.     Gait: Gait normal.     Deep Tendon Reflexes: Reflexes are normal and symmetric. Reflexes normal.     Comments: Pain medial to L shoulder blade_ muscle spasm Tenderness on palpation  No dec in rom       No results found for any visits on 07/08/22.  Last CBC Lab Results  Component Value Date   WBC 5.1 12/31/2021   HGB 11.3 (L) 12/31/2021   HCT 33.1 (L) 12/31/2021   MCV 94.6 12/31/2021   MCH 32.3 09/13/2019   RDW 12.9 12/31/2021   PLT 270.0 82/50/0370   Last metabolic panel Lab Results  Component Value Date   GLUCOSE 93 12/31/2021   NA 133 (L) 12/31/2021   K 3.8 12/31/2021   CL 96 12/31/2021   CO2 34 (H) 12/31/2021   BUN 11 12/31/2021   CREATININE 0.67 12/31/2021   EGFR 91 10/29/2021   CALCIUM 9.6 12/31/2021   PROT 6.4 12/31/2021   ALBUMIN 4.4 12/31/2021   BILITOT 0.5 12/31/2021   ALKPHOS 51 12/31/2021   AST 16 12/31/2021   ALT 13 12/31/2021   Last lipids Lab Results  Component Value Date   CHOL 136 12/31/2021   HDL 69.70 12/31/2021   LDLCALC 56 12/31/2021   TRIG 52.0 12/31/2021   CHOLHDL 2 12/31/2021   Last hemoglobin A1c Lab Results  Component Value Date   HGBA1C 5.9 03/06/2018   Last thyroid functions Lab Results  Component Value Date   TSH 1.68 05/27/2017   Last vitamin D Lab Results  Component Value Date   VD25OH 65 03/03/2010   Last vitamin B12 and Folate Lab Results  Component Value Date   WUGQBVQX45 038  07/19/2014      The 10-year ASCVD risk score (Arnett DK, et al., 2019) is: 24.6%    Assessment & Plan:   Problem List Items Addressed This Visit       Unprioritized   Muscle spasm of left shoulder - Primary    Muscle relaxer sent in Recommend massage / chiropractor  salonpas patch with lidocaine/ tylenol  Heat will help       Relevant Medications   tiZANidine (ZANAFLEX) 4 MG capsule   Other Relevant Orders   Ambulatory referral to Chiropractic    Return if symptoms worsen or fail to improve.    Ann Held, DO

## 2022-07-08 NOTE — Assessment & Plan Note (Signed)
Muscle relaxer sent in Recommend massage / chiropractor  salonpas patch with lidocaine/ tylenol  Heat will help

## 2022-07-13 DIAGNOSIS — H9193 Unspecified hearing loss, bilateral: Secondary | ICD-10-CM | POA: Diagnosis not present

## 2022-07-13 DIAGNOSIS — M25512 Pain in left shoulder: Secondary | ICD-10-CM | POA: Diagnosis not present

## 2022-07-14 DIAGNOSIS — M6281 Muscle weakness (generalized): Secondary | ICD-10-CM | POA: Diagnosis not present

## 2022-07-14 DIAGNOSIS — S46012D Strain of muscle(s) and tendon(s) of the rotator cuff of left shoulder, subsequent encounter: Secondary | ICD-10-CM | POA: Diagnosis not present

## 2022-07-19 ENCOUNTER — Encounter: Payer: Self-pay | Admitting: Interventional Cardiology

## 2022-07-19 MED ORDER — HYDROCHLOROTHIAZIDE 25 MG PO TABS: 12.5000 mg | ORAL_TABLET | Freq: Every day | ORAL | 5 refills | Status: DC

## 2022-07-22 DIAGNOSIS — S46012D Strain of muscle(s) and tendon(s) of the rotator cuff of left shoulder, subsequent encounter: Secondary | ICD-10-CM | POA: Diagnosis not present

## 2022-07-22 DIAGNOSIS — M6281 Muscle weakness (generalized): Secondary | ICD-10-CM | POA: Diagnosis not present

## 2022-07-27 DIAGNOSIS — M6281 Muscle weakness (generalized): Secondary | ICD-10-CM | POA: Diagnosis not present

## 2022-07-27 DIAGNOSIS — S46012D Strain of muscle(s) and tendon(s) of the rotator cuff of left shoulder, subsequent encounter: Secondary | ICD-10-CM | POA: Diagnosis not present

## 2022-07-27 DIAGNOSIS — M542 Cervicalgia: Secondary | ICD-10-CM | POA: Diagnosis not present

## 2022-07-29 DIAGNOSIS — M542 Cervicalgia: Secondary | ICD-10-CM | POA: Diagnosis not present

## 2022-08-02 DIAGNOSIS — S46012D Strain of muscle(s) and tendon(s) of the rotator cuff of left shoulder, subsequent encounter: Secondary | ICD-10-CM | POA: Diagnosis not present

## 2022-08-02 DIAGNOSIS — M6281 Muscle weakness (generalized): Secondary | ICD-10-CM | POA: Diagnosis not present

## 2022-08-03 ENCOUNTER — Other Ambulatory Visit: Payer: Self-pay | Admitting: Orthopedic Surgery

## 2022-08-03 DIAGNOSIS — M5030 Other cervical disc degeneration, unspecified cervical region: Secondary | ICD-10-CM

## 2022-08-17 ENCOUNTER — Other Ambulatory Visit: Payer: Medicare HMO

## 2022-09-13 DIAGNOSIS — H906 Mixed conductive and sensorineural hearing loss, bilateral: Secondary | ICD-10-CM | POA: Diagnosis not present

## 2022-09-22 DIAGNOSIS — M4802 Spinal stenosis, cervical region: Secondary | ICD-10-CM | POA: Diagnosis not present

## 2022-09-22 DIAGNOSIS — M5032 Other cervical disc degeneration, mid-cervical region, unspecified level: Secondary | ICD-10-CM | POA: Diagnosis not present

## 2022-09-22 DIAGNOSIS — M5412 Radiculopathy, cervical region: Secondary | ICD-10-CM | POA: Diagnosis not present

## 2022-10-04 DIAGNOSIS — M6281 Muscle weakness (generalized): Secondary | ICD-10-CM | POA: Diagnosis not present

## 2022-10-04 DIAGNOSIS — M47892 Other spondylosis, cervical region: Secondary | ICD-10-CM | POA: Diagnosis not present

## 2022-10-06 DIAGNOSIS — M6281 Muscle weakness (generalized): Secondary | ICD-10-CM | POA: Diagnosis not present

## 2022-10-06 DIAGNOSIS — M47892 Other spondylosis, cervical region: Secondary | ICD-10-CM | POA: Diagnosis not present

## 2022-10-11 DIAGNOSIS — M6281 Muscle weakness (generalized): Secondary | ICD-10-CM | POA: Diagnosis not present

## 2022-10-11 DIAGNOSIS — M47892 Other spondylosis, cervical region: Secondary | ICD-10-CM | POA: Diagnosis not present

## 2022-10-14 DIAGNOSIS — M47892 Other spondylosis, cervical region: Secondary | ICD-10-CM | POA: Diagnosis not present

## 2022-10-14 DIAGNOSIS — M6281 Muscle weakness (generalized): Secondary | ICD-10-CM | POA: Diagnosis not present

## 2022-12-22 ENCOUNTER — Encounter: Payer: Self-pay | Admitting: Family Medicine

## 2022-12-22 ENCOUNTER — Ambulatory Visit (INDEPENDENT_AMBULATORY_CARE_PROVIDER_SITE_OTHER): Payer: HMO | Admitting: Family Medicine

## 2022-12-22 VITALS — BP 160/76 | HR 59 | Temp 97.5°F | Resp 16 | Ht 61.0 in | Wt 118.8 lb

## 2022-12-22 DIAGNOSIS — H6122 Impacted cerumen, left ear: Secondary | ICD-10-CM | POA: Diagnosis not present

## 2022-12-22 NOTE — Progress Notes (Signed)
Acute Office Visit  Subjective:     Patient ID: Natasha Trujillo, female    DOB: February 23, 1946, 77 y.o.   MRN: [id number]  Chief Complaint  Patient presents with   ear discomfort    HPI Patient is in today for left ear complaint.   Patient reports that for the past week or so she has noticed a clogged sound and occasional "echo" to her left ear. Sometimes she feels like there is some fluid stuck in the canal. She scratched inside the other day and had some wax come out on her finger. States there is an occasional mild discomfort, but no pain, itching, dental pain, fevers, chills, rhinorrhea, cough, sore throat.    ROS All review of systems negative except what is listed in the HPI      Objective:    BP (!) 160/76   Pulse (!) 59   Temp (!) 97.5 F (36.4 C)   Resp 16   Ht 5' 1"$  (1.549 m)   Wt 118 lb 12.8 oz (53.9 kg)   SpO2 95%   BMI 22.45 kg/m    Physical Exam Vitals reviewed.  Constitutional:      Appearance: Normal appearance.  HENT:     Right Ear: Tympanic membrane, ear canal and external ear normal. No swelling or tenderness. There is impacted cerumen.     Left Ear: External ear normal.  Cardiovascular:     Rate and Rhythm: Normal rate and regular rhythm.     Pulses: Normal pulses.     Heart sounds: Normal heart sounds.  Pulmonary:     Effort: Pulmonary effort is normal.     Breath sounds: Normal breath sounds.  Skin:    General: Skin is warm and dry.  Neurological:     Mental Status: She is alert and oriented to person, place, and time.  Psychiatric:        Mood and Affect: Mood normal.        Behavior: Behavior normal.        Thought Content: Thought content normal.        Judgment: Judgment normal.       No results found for any visits on 12/22/22.      Assessment & Plan:   Problem List Items Addressed This Visit   None Visit Diagnoses     Impacted cerumen of left ear    -  Primary  Indication: Cerumen impaction of the  ear(s)  Medical necessity statement: On physical examination, cerumen impairs clinically significant portions of the external auditory canal, and tympanic membrane. Noted obstructive, copious cerumen that cannot be removed without magnification and instrumentations requiring professional removal.   Consent: Discussed benefits and risks of procedure and verbal consent obtained  Procedure: Patient was prepped for the procedure. Otoscope utilized to assess and take note of the ear canal, the tympanic membrane, and the presence, amount, and placement of the cerumen.  Liquid docusate sodium instilled into the ear canal(s) and allowed to penetrate for 5 minutes before drainage by gravity. Gentle irrigation with water at body temperature and soft plastic curette utilized to remove impacted cerumen.  Excess water drained by gravity and ear canal(s) dried with clean guaze.  Post procedure examination: Otoscopic examination reveals complete cerumen removal with no damage to the auditory canal, tympanic membrane, or surrounding tissue.  Patient tolerated procedure well.   Post procedure instructions: Patient made aware that they may experience temporary vertigo, temporary changes in hearing, and temporary discomfort. If  these symptom last for more than 24 hours to call the clinic or proceed to the ED for further evaluation. Discussed avoiding placing objects into the ear canal for cleaning.         No orders of the defined types were placed in this encounter.   Return if symptoms worsen or fail to improve.  Terrilyn Saver, NP

## 2022-12-22 NOTE — Patient Instructions (Addendum)
Your blood pressure is high - please monitor at home and send [place] and/or cardiology a list of readings. Otherwise, return in 2 weeks for blood pressure check.

## 2022-12-23 ENCOUNTER — Ambulatory Visit (INDEPENDENT_AMBULATORY_CARE_PROVIDER_SITE_OTHER): Payer: HMO | Admitting: Family Medicine

## 2022-12-23 ENCOUNTER — Encounter: Payer: Self-pay | Admitting: Family Medicine

## 2022-12-23 VITALS — BP 138/88 | HR 63 | Temp 97.8°F | Resp 18 | Ht 61.0 in | Wt 117.8 lb

## 2022-12-23 DIAGNOSIS — Z0000 Encounter for general adult medical examination without abnormal findings: Secondary | ICD-10-CM

## 2022-12-23 DIAGNOSIS — I10 Essential (primary) hypertension: Secondary | ICD-10-CM

## 2022-12-23 DIAGNOSIS — E785 Hyperlipidemia, unspecified: Secondary | ICD-10-CM | POA: Diagnosis not present

## 2022-12-23 LAB — CBC WITH DIFFERENTIAL/PLATELET
Basophils Absolute: 0.1 10*3/uL (ref 0.0–0.1)
Basophils Relative: 1 % (ref 0.0–3.0)
Eosinophils Absolute: 0.6 10*3/uL (ref 0.0–0.7)
Eosinophils Relative: 10 % — ABNORMAL HIGH (ref 0.0–5.0)
HCT: 37.8 % (ref 36.0–46.0)
Hemoglobin: 12.7 g/dL (ref 12.0–15.0)
Lymphocytes Relative: 22.3 % (ref 12.0–46.0)
Lymphs Abs: 1.4 10*3/uL (ref 0.7–4.0)
MCHC: 33.7 g/dL (ref 30.0–36.0)
MCV: 96.1 fl (ref 78.0–100.0)
Monocytes Absolute: 0.6 10*3/uL (ref 0.1–1.0)
Monocytes Relative: 9.5 % (ref 3.0–12.0)
Neutro Abs: 3.5 10*3/uL (ref 1.4–7.7)
Neutrophils Relative %: 57.2 % (ref 43.0–77.0)
Platelets: 290 10*3/uL (ref 150.0–400.0)
RBC: 3.94 Mil/uL (ref 3.87–5.11)
RDW: 12.8 % (ref 11.5–15.5)
WBC: 6.1 10*3/uL (ref 4.0–10.5)

## 2022-12-23 LAB — COMPREHENSIVE METABOLIC PANEL
ALT: 15 U/L (ref 0–35)
AST: 19 U/L (ref 0–37)
Albumin: 4.3 g/dL (ref 3.5–5.2)
Alkaline Phosphatase: 52 U/L (ref 39–117)
BUN: 11 mg/dL (ref 6–23)
CO2: 30 mEq/L (ref 19–32)
Calcium: 9.3 mg/dL (ref 8.4–10.5)
Chloride: 99 mEq/L (ref 96–112)
Creatinine, Ser: 0.59 mg/dL (ref 0.40–1.20)
GFR: 88.13 mL/min (ref >60.00)
Glucose, Bld: 92 mg/dL (ref 70–99)
Potassium: 3.9 mEq/L (ref 3.5–5.1)
Sodium: 137 mEq/L (ref 135–145)
Total Bilirubin: 0.5 mg/dL (ref 0.2–1.2)
Total Protein: 6.5 g/dL (ref 6.0–8.3)

## 2022-12-23 LAB — LIPID PANEL
Cholesterol: 147 mg/dL (ref 0–200)
HDL: 69 mg/dL (ref >39.00)
LDL Cholesterol: 67 mg/dL (ref 0–99)
NonHDL: 78.44
Total CHOL/HDL Ratio: 2
Triglycerides: 57 mg/dL (ref 0.0–149.0)
VLDL: 11.4 mg/dL (ref 0.0–40.0)

## 2022-12-23 NOTE — Progress Notes (Signed)
Subjective:   By signing my name below, I, Shehryar Baig, attest that this documentation has been prepared under the direction and in the presence of Ann Held, DO. 12/23/2022   Patient ID: Natasha Trujillo, female    DOB: 1945-12-30, 77 y.o.   MRN: [id number]  Chief Complaint  Patient presents with   Annual Exam    Pt states fasting     HPI Patient is in today for a comprehensive physical exam.   She complains of developed right knee pain. She continues having left knee pain. She typically walks on pavement and/or hills while exercising. She wears supportive shoes to help manage her pain.  She has an upcomming appointment with her new cardiologist for blood pressure follow up in May 2024.  She denies fever, new moles, congestion, sinus pain, sore throat, chest pain, palpitations, cough, shortness of breath, wheezing, nausea, vomiting, abdominal pain, diarrhea, constipation, dysuria, frequency, hematuria, new muscle pain, new joint pain, or headaches at this time.  She has no new surgical procedures to report. She has no change in her family medical history.  She continues participating in regular exercise by walking at least 4 miles every week.  She does not have the latest Covid-19 vaccine. She is interested in receiving the latest RSV vaccine.  She reports being UTD on mammogram and has an upcomming appointment scheduled.  She reports being UTD on bone density scans and completes them at her GYN's office.  She is UTD on pap smears and reports completing them at her GYN's office.  Her colonoscopy was last completed 03/28/2019. Results showed one diminutive polyp in the transverse colon, ulcerated mucosa in the sigmoid colon, mild diverticulosis in the sigmoid colon, otherwise results are normal.    Past Medical History:  Diagnosis Date   Hyperlipidemia    Hypertension    Osteopenia    Osteoporosis     Past Surgical History:  Procedure Laterality Date    BLEPHAROPLASTY Bilateral 08/2016   Dr Shelbie Proctor   KNEE SURGERY Left 09/12/13   SHOULDER SURGERY Right 01/05/2017   Dr.Caffrey   TUBAL LIGATION      Family History  Problem Relation Age of Onset   Dementia Mother    Hypertension Mother    Heart disease Mother 22       CABG   Hypertension Father    Heart disease Father 91       MI   Diabetes Brother 27       type 1   Hypertension Brother    Heart disease Brother        cabg   Cancer Maternal Aunt 68       cervical   Diabetes Other        committed suicide   Coronary artery disease Other        female 1st degree relative <50/female 1st degree relative <60   Cervical cancer Other    Lung cancer Other    Heart attack Son 71   Colon cancer Neg Hx    Esophageal cancer Neg Hx    Rectal cancer Neg Hx    Stomach cancer Neg Hx     Social History   Socioeconomic History   Marital status: Married    Spouse name: Not on file   Number of children: Not on file   Years of education: Not on file   Highest education level: Not on file  Occupational History   Occupation: retired  Employer: RETIRED  Tobacco Use   Smoking status: Never   Smokeless tobacco: Never  Vaping Use   Vaping Use: Never used  Substance and Sexual Activity   Alcohol use: Yes    Comment: occasional   Drug use: No   Sexual activity: Yes    Partners: Male  Other Topics Concern   Not on file  Social History Narrative   Exercise--walk 4 miles 5 days a week   Social Determinants of Health   Financial Resource Strain: Low Risk  (02/25/2022)   Overall Financial Resource Strain (CARDIA)    Difficulty of Paying Living Expenses: Not hard at all  Food Insecurity: No Food Insecurity (02/25/2022)   Hunger Vital Sign    Worried About Running Out of Food in the Last Year: Never true    Ran Out of Food in the Last Year: Never true  Transportation Needs: No Transportation Needs (02/25/2022)   PRAPARE - Hydrologist (Medical): No     Lack of Transportation (Non-Medical): No  Physical Activity: Sufficiently Active (02/25/2022)   Exercise Vital Sign    Days of Exercise per Week: 5 days    Minutes of Exercise per Session: 60 min  Stress: No Stress Concern Present (02/25/2022)   [place]    Feeling of Stress : Not at all  Social Connections: Not on file  Intimate Partner Violence: Not on file    Outpatient Medications Prior to Visit  Medication Sig Dispense Refill   aspirin 81 MG EC tablet Take 81 mg by mouth daily.     CALCIUM PO Take by mouth. Pt states taking 1000 MG daily     carvedilol (COREG) 12.5 MG tablet TAKE 1 TABLET TWICE DAILY 180 tablet 3   hydrochlorothiazide (HYDRODIURIL) 25 MG tablet Take 0.5 tablets (12.5 mg total) by mouth daily. 30 tablet 5   olmesartan (BENICAR) 40 MG tablet TAKE 1 TABLET EVERY DAY 90 tablet 2   rosuvastatin (CRESTOR) 20 MG tablet Take 1 tablet by mouth once daily 90 tablet 3   VITAMIN D PO Take by mouth. Pt states taking 600 g daily     No facility-administered medications prior to visit.    Allergies  Allergen Reactions   Lisinopril Other (See Comments)    Throat swelling   Adrenalin [Epinephrine] Other (See Comments)    dizziness   Amlodipine     LE edema on 61m dose, tolerates 566mdose well   Atorvastatin     Cramping in legs on 4039mose    Review of Systems  Constitutional:  Negative for fever and malaise/fatigue.  HENT:  Negative for congestion, sinus pain and sore throat.   Eyes:  Negative for blurred vision.  Respiratory:  Negative for cough, shortness of breath and wheezing.   Cardiovascular:  Negative for chest pain, palpitations and leg swelling.  Gastrointestinal:  Negative for abdominal pain, constipation, diarrhea, nausea and vomiting.  Genitourinary:  Negative for dysuria, frequency and hematuria.  Musculoskeletal:  Negative for back pain.       (-)new muscle pain (-)new joint pain   Skin:  Negative for rash.       (-)New moles  Neurological:  Negative for loss of consciousness and headaches.       Objective:    Physical Exam Vitals and nursing note reviewed.  Constitutional:      General: She is not in acute distress.    Appearance: Normal appearance.  She is well-developed. She is not ill-appearing.  HENT:     Head: Normocephalic and atraumatic.     Right Ear: Tympanic membrane, ear canal and external ear normal.     Left Ear: Tympanic membrane, ear canal and external ear normal.     Nose: Nose normal.  Eyes:     Extraocular Movements: Extraocular movements intact.     Pupils: Pupils are equal, round, and reactive to light.  Cardiovascular:     Rate and Rhythm: Normal rate and regular rhythm.     Heart sounds: Normal heart sounds. No murmur heard.    No gallop.  Pulmonary:     Effort: Pulmonary effort is normal. No respiratory distress.     Breath sounds: Normal breath sounds. No wheezing or rales.  Chest:     Chest wall: No tenderness.  Abdominal:     General: Bowel sounds are normal. There is no distension.     Palpations: Abdomen is soft.     Tenderness: There is no abdominal tenderness. There is no guarding.  Musculoskeletal:        General: Normal range of motion.     Cervical back: Normal range of motion and neck supple.  Skin:    General: Skin is warm and dry.  Neurological:     Mental Status: She is alert and oriented to person, place, and time.  Psychiatric:        Behavior: Behavior normal.        Thought Content: Thought content normal.        Judgment: Judgment normal.     BP 138/88 (BP Location: Left Arm, Patient Position: Sitting, Cuff Size: Normal)   Pulse 63   Temp 97.8 F (36.6 C) (Oral)   Resp 18   Ht 5' 1"$  (1.549 m)   Wt 117 lb 12.8 oz (53.4 kg)   SpO2 99%   BMI 22.26 kg/m  Wt Readings from Last 3 Encounters:  12/23/22 117 lb 12.8 oz (53.4 kg)  12/22/22 118 lb 12.8 oz (53.9 kg)  07/08/22 118 lb (53.5 kg)        Assessment & Plan:  Preventative health care Assessment & Plan: Ghm utd Check labs  See AVS    Primary hypertension Assessment & Plan: Well controlled, no changes to meds. Encouraged heart healthy diet such as the DASH diet and exercise as tolerated.    Orders: -     CBC with Differential/Platelet -     Comprehensive metabolic panel -     Lipid panel  Hyperlipidemia, unspecified hyperlipidemia type Assessment & Plan: Encourage heart healthy diet such as MIND or DASH diet, increase exercise, avoid trans fats, simple carbohydrates and processed foods, consider a krill or fish or flaxseed oil cap daily.    Orders: -     CBC with Differential/Platelet -     Comprehensive metabolic panel -     Lipid panel    I, Ann Held, DO, personally preformed the services described in this documentation.  All medical record entries made by the scribe were at my direction and in my presence.  I have reviewed the chart and discharge instructions (if applicable) and agree that the record reflects my personal performance and is accurate and complete. 12/23/2022   I,Shehryar Baig,acting as a scribe for Ann Held, DO.,have documented all relevant documentation on the behalf of Ann Held, DO,as directed by  Ann Held, DO while in the presence of Allen  R Lowne Chase, DO.   Ann Held, DO

## 2022-12-23 NOTE — Patient Instructions (Signed)
Preventive Care 65 Years and Older, Female Preventive care refers to lifestyle choices and visits with your health care provider that can promote health and wellness. Preventive care visits are also called wellness exams. What can I expect for my preventive care visit? Counseling Your health care provider may ask you questions about your: Medical history, including: Past medical problems. Family medical history. Pregnancy and menstrual history. History of falls. Current health, including: Memory and ability to understand (cognition). Emotional well-being. Home life and relationship well-being. Sexual activity and sexual health. Lifestyle, including: Alcohol, nicotine or tobacco, and drug use. Access to firearms. Diet, exercise, and sleep habits. Work and work environment. Sunscreen use. Safety issues such as seatbelt and bike helmet use. Physical exam Your health care provider will check your: Height and weight. These may be used to calculate your BMI (body mass index). BMI is a measurement that tells if you are at a healthy weight. Waist circumference. This measures the distance around your waistline. This measurement also tells if you are at a healthy weight and may help predict your risk of certain diseases, such as type 2 diabetes and high blood pressure. Heart rate and blood pressure. Body temperature. Skin for abnormal spots. What immunizations do I need?  Vaccines are usually given at various ages, according to a schedule. Your health care provider will recommend vaccines for you based on your age, medical history, and lifestyle or other factors, such as travel or where you work. What tests do I need? Screening Your health care provider may recommend screening tests for certain conditions. This may include: Lipid and cholesterol levels. Hepatitis C test. Hepatitis B test. HIV (human immunodeficiency virus) test. STI (sexually transmitted infection) testing, if you are at  risk. Lung cancer screening. Colorectal cancer screening. Diabetes screening. This is done by checking your blood sugar (glucose) after you have not eaten for a while (fasting). Mammogram. Talk with your health care provider about how often you should have regular mammograms. BRCA-related cancer screening. This may be done if you have a family history of breast, ovarian, tubal, or peritoneal cancers. Bone density scan. This is done to screen for osteoporosis. Talk with your health care provider about your test results, treatment options, and if necessary, the need for more tests. Follow these instructions at home: Eating and drinking  Eat a diet that includes fresh fruits and vegetables, whole grains, lean protein, and low-fat dairy products. Limit your intake of foods with high amounts of sugar, saturated fats, and salt. Take vitamin and mineral supplements as recommended by your health care provider. Do not drink alcohol if your health care provider tells you not to drink. If you drink alcohol: Limit how much you have to 0-1 drink a day. Know how much alcohol is in your drink. In the [place] one drink equals one 12 oz bottle of beer (355 mL), one 5 oz glass of wine (148 mL), or one 1 oz glass of hard liquor (44 mL). Lifestyle Brush your teeth every morning and night with fluoride toothpaste. Floss one time each day. Exercise for at least 30 minutes 5 or more days each week. Do not use any products that contain nicotine or tobacco. These products include cigarettes, chewing tobacco, and vaping devices, such as e-cigarettes. If you need help quitting, ask your health care provider. Do not use drugs. If you are sexually active, practice safe sex. Use a condom or other form of protection in order to prevent STIs. Take aspirin only as told by   your health care provider. Make sure that you understand how much to take and what form to take. Work with your health care provider to find out whether it  is safe and beneficial for you to take aspirin daily. Ask your health care provider if you need to take a cholesterol-lowering medicine (statin). Find healthy ways to manage stress, such as: Meditation, yoga, or listening to music. Journaling. Talking to a trusted person. Spending time with friends and family. Minimize exposure to UV radiation to reduce your risk of skin cancer. Safety Always wear your seat belt while driving or riding in a vehicle. Do not drive: If you have been drinking alcohol. Do not ride with someone who has been drinking. When you are tired or distracted. While texting. If you have been using any mind-altering substances or drugs. Wear a helmet and other protective equipment during sports activities. If you have firearms in your house, make sure you follow all gun safety procedures. What's next? Visit your health care provider once a year for an annual wellness visit. Ask your health care provider how often you should have your eyes and teeth checked. Stay up to date on all vaccines. This information is not intended to replace advice given to you by your health care provider. Make sure you discuss any questions you have with your health care provider. Document Revised: 04/22/2021 Document Reviewed: 04/22/2021 Elsevier Patient Education  2023 Elsevier Inc.  

## 2022-12-23 NOTE — Assessment & Plan Note (Signed)
Well controlled, no changes to meds. Encouraged heart healthy diet such as the DASH diet and exercise as tolerated.  

## 2022-12-23 NOTE — Assessment & Plan Note (Signed)
Ghm utd Check labs  See AVS

## 2022-12-23 NOTE — Assessment & Plan Note (Signed)
Encourage heart healthy diet such as MIND or DASH diet, increase exercise, avoid trans fats, simple carbohydrates and processed foods, consider a krill or fish or flaxseed oil cap daily.  °

## 2022-12-27 ENCOUNTER — Telehealth: Payer: Self-pay | Admitting: Internal Medicine

## 2022-12-27 ENCOUNTER — Encounter: Payer: Self-pay | Admitting: Family Medicine

## 2022-12-27 ENCOUNTER — Telehealth (INDEPENDENT_AMBULATORY_CARE_PROVIDER_SITE_OTHER): Payer: HMO | Admitting: Family Medicine

## 2022-12-27 VITALS — BP 121/69 | HR 74 | Temp 100.2°F | Ht 61.0 in | Wt 116.0 lb

## 2022-12-27 DIAGNOSIS — U071 COVID-19: Secondary | ICD-10-CM | POA: Diagnosis not present

## 2022-12-27 MED ORDER — NIRMATRELVIR/RITONAVIR (PAXLOVID)TABLET: 3.0000 | ORAL_TABLET | Freq: Two times a day (BID) | ORAL | 0 refills | Status: AC

## 2022-12-27 MED ORDER — NIRMATRELVIR/RITONAVIR (PAXLOVID)TABLET: 3.0000 | ORAL_TABLET | Freq: Two times a day (BID) | ORAL | 0 refills | Status: DC

## 2022-12-27 MED ORDER — PROMETHAZINE-DM 6.25-15 MG/5ML PO SYRP: 5.0000 mL | ORAL_SOLUTION | Freq: Four times a day (QID) | ORAL | 0 refills | Status: DC | PRN

## 2022-12-27 NOTE — Telephone Encounter (Signed)
Pt called to see if pcp would send in rx.

## 2022-12-27 NOTE — Progress Notes (Signed)
MyChart Video Visit    Virtual Visit via Video Note   This visit type was conducted due to national recommendations for restrictions regarding the COVID-19 Pandemic (e.g. social distancing) in an effort to limit this patient's exposure and mitigate transmission in our community. This patient is at least at moderate risk for complications without adequate follow up. This format is felt to be most appropriate for this patient at this time. Physical exam was limited by quality of the video and audio technology used for the visit. Nira Conn was able to get the patient set up on a video visit.  Patient location: Home Patient and provider in visit Provider location: Office  I discussed the limitations of evaluation and management by telemedicine and the availability of in person appointments. The patient expressed understanding and agreed to proceed.  Visit Date: 12/27/2022.  Today's healthcare provider: Ann Held, DO     Subjective:    Patient ID: Natasha Trujillo, female    DOB: [birth year], 77 y.o.   MRN: [id number]  Chief Complaint  Patient presents with   Covid Positive    Sx started Saturday. Scratchy throat, cough, fatigue.     HPI Patient is in today for a video visit.   She complains of a constant cough, fever, and fatigue. She began experiencing a raspy sore throat on Saturday evening around 9 pm. She tested positive for Covid at 10:30 this morning. She had a temperature of 102 when she checked it and has not rechecked it. Her oxygen saturation level was at 94% and her pulse was 77 bpm. She has spent most of the day in bed.   Past Medical History:  Diagnosis Date   Hyperlipidemia    Hypertension    Osteopenia    Osteoporosis     Past Surgical History:  Procedure Laterality Date   BLEPHAROPLASTY Bilateral 08/2016   Dr Shelbie Proctor   KNEE SURGERY Left 09/12/13   SHOULDER SURGERY Right 01/05/2017   Dr.Caffrey   TUBAL LIGATION      Family History   Problem Relation Age of Onset   Dementia Mother    Hypertension Mother    Heart disease Mother 69       CABG   Hypertension Father    Heart disease Father 44       MI   Diabetes Brother 64       type 1   Hypertension Brother    Heart disease Brother        cabg   Cancer Maternal Aunt 68       cervical   Diabetes Other        committed suicide   Coronary artery disease Other        female 1st degree relative <50/female 1st degree relative <60   Cervical cancer Other    Lung cancer Other    Heart attack Son 72   Colon cancer Neg Hx    Esophageal cancer Neg Hx    Rectal cancer Neg Hx    Stomach cancer Neg Hx     Social History   Socioeconomic History   Marital status: Married    Spouse name: Not on file   Number of children: Not on file   Years of education: Not on file   Highest education level: Not on file  Occupational History   Occupation: retired    Fish farm manager: RETIRED  Tobacco Use   Smoking status: Never   Smokeless tobacco: Never  Vaping  Use   Vaping Use: Never used  Substance and Sexual Activity   Alcohol use: Yes    Comment: occasional   Drug use: No   Sexual activity: Yes    Partners: Male  Other Topics Concern   Not on file  Social History Narrative   Exercise--walk 4 miles 5 days a week   Social Determinants of Health   Financial Resource Strain: Low Risk  (02/25/2022)   Overall Financial Resource Strain (CARDIA)    Difficulty of Paying Living Expenses: Not hard at all  Food Insecurity: No Food Insecurity (02/25/2022)   Hunger Vital Sign    Worried About Running Out of Food in the Last Year: Never true    Ran Out of Food in the Last Year: Never true  Transportation Needs: No Transportation Needs (02/25/2022)   PRAPARE - Hydrologist (Medical): No    Lack of Transportation (Non-Medical): No  Physical Activity: Sufficiently Active (02/25/2022)   Exercise Vital Sign    Days of Exercise per Week: 5 days    Minutes of  Exercise per Session: 60 min  Stress: No Stress Concern Present (02/25/2022)   [place]    Feeling of Stress : Not at all  Social Connections: Not on file  Intimate Partner Violence: Not on file    Outpatient Medications Prior to Visit  Medication Sig Dispense Refill   aspirin 81 MG EC tablet Take 81 mg by mouth daily.     CALCIUM PO Take by mouth. Pt states taking 1000 MG daily     carvedilol (COREG) 12.5 MG tablet TAKE 1 TABLET TWICE DAILY 180 tablet 3   hydrochlorothiazide (HYDRODIURIL) 25 MG tablet Take 0.5 tablets (12.5 mg total) by mouth daily. 30 tablet 5   olmesartan (BENICAR) 40 MG tablet TAKE 1 TABLET EVERY DAY 90 tablet 2   rosuvastatin (CRESTOR) 20 MG tablet Take 1 tablet by mouth once daily 90 tablet 3   VITAMIN D PO Take by mouth. Pt states taking 600 g daily     No facility-administered medications prior to visit.    Allergies  Allergen Reactions   Lisinopril Other (See Comments)    Throat swelling   Adrenalin [Epinephrine] Other (See Comments)    dizziness   Amlodipine     LE edema on 72m dose, tolerates 54mdose well   Atorvastatin     Cramping in legs on 4072mose    Review of Systems  Constitutional:  Positive for fever (102) and malaise/fatigue.  HENT:  Positive for sore throat (raspy at onset). Negative for congestion.   Eyes:  Negative for blurred vision.  Respiratory:  Positive for cough. Negative for shortness of breath.   Cardiovascular:  Negative for chest pain, palpitations and leg swelling.  Gastrointestinal:  Negative for abdominal pain, blood in stool and nausea.  Genitourinary:  Negative for dysuria and frequency.  Musculoskeletal:  Negative for falls.  Skin:  Negative for rash.  Neurological:  Negative for dizziness, loss of consciousness and headaches.  Endo/Heme/Allergies:  Negative for environmental allergies.  Psychiatric/Behavioral:  Negative for depression. The  patient is not nervous/anxious.        Objective:    Physical Exam Vitals and nursing note reviewed.  Constitutional:      Appearance: She is well-developed. She is ill-appearing. She is not toxic-appearing.  HENT:     Head: Normocephalic and atraumatic.  Neurological:     Mental Status:  She is alert and oriented to person, place, and time.     BP 121/69   Pulse 74   Temp 100.2 F (37.9 C)   Ht 5' 1"$  (1.549 m)   Wt 116 lb (52.6 kg)   SpO2 94%   BMI 21.92 kg/m  Wt Readings from Last 3 Encounters:  12/27/22 116 lb (52.6 kg)  12/23/22 117 lb 12.8 oz (53.4 kg)  12/22/22 118 lb 12.8 oz (53.9 kg)    Diabetic Foot Exam - Simple   No data filed    Lab Results  Component Value Date   WBC 6.1 12/23/2022   HGB 12.7 12/23/2022   HCT 37.8 12/23/2022   PLT 290.0 12/23/2022   GLUCOSE 92 12/23/2022   CHOL 147 12/23/2022   TRIG 57.0 12/23/2022   HDL 69.00 12/23/2022   LDLCALC 67 12/23/2022   ALT 15 12/23/2022   AST 19 12/23/2022   NA 137 12/23/2022   K 3.9 12/23/2022   CL 99 12/23/2022   CREATININE 0.59 12/23/2022   BUN 11 12/23/2022   CO2 30 12/23/2022   TSH 1.68 05/27/2017   HGBA1C 5.9 03/06/2018   MICROALBUR 0.50 05/10/2013    Lab Results  Component Value Date   TSH 1.68 05/27/2017   Lab Results  Component Value Date   WBC 6.1 12/23/2022   HGB 12.7 12/23/2022   HCT 37.8 12/23/2022   MCV 96.1 12/23/2022   PLT 290.0 12/23/2022   Lab Results  Component Value Date   NA 137 12/23/2022   K 3.9 12/23/2022   CO2 30 12/23/2022   GLUCOSE 92 12/23/2022   BUN 11 12/23/2022   CREATININE 0.59 12/23/2022   BILITOT 0.5 12/23/2022   ALKPHOS 52 12/23/2022   AST 19 12/23/2022   ALT 15 12/23/2022   PROT 6.5 12/23/2022   ALBUMIN 4.3 12/23/2022   CALCIUM 9.3 12/23/2022   EGFR 91 10/29/2021   GFR 88.13 12/23/2022   Lab Results  Component Value Date   CHOL 147 12/23/2022   Lab Results  Component Value Date   HDL 69.00 12/23/2022   Lab Results  Component  Value Date   LDLCALC 67 12/23/2022   Lab Results  Component Value Date   TRIG 57.0 12/23/2022   Lab Results  Component Value Date   CHOLHDL 2 12/23/2022   Lab Results  Component Value Date   HGBA1C 5.9 03/06/2018       Assessment & Plan:   Problem List Items Addressed This Visit       Unprioritized   COVID-19 - Primary    Paxlovid sent in  Phenergan dm as needed  Quarantine for 5 days ---- if symptoms are improving she can leave the house with mask for 5 more days       Relevant Medications   nirmatrelvir/ritonavir (PAXLOVID) 20 x 150 MG & 10 x 100MG TABS   Meds ordered this encounter  Medications   nirmatrelvir/ritonavir (PAXLOVID) 20 x 150 MG & 10 x 100MG TABS    Sig: Take 3 tablets by mouth 2 (two) times daily for 5 days. (Take nirmatrelvir 150 mg two tablets twice daily for 5 days and ritonavir 100 mg one tablet twice daily for 5 days) Patient GFR is 88    Dispense:  30 tablet    Refill:  0   promethazine-dextromethorphan (PROMETHAZINE-DM) 6.25-15 MG/5ML syrup    Sig: Take 5 mLs by mouth 4 (four) times daily as needed.    Dispense:  118 mL  Refill:  0    I discussed the assessment and treatment plan with the patient. The patient was provided an opportunity to ask questions and all were answered. The patient agreed with the plan and demonstrated an understanding of the instructions.   The patient was advised to call back or seek an in-person evaluation if the symptoms worsen or if the condition fails to improve as anticipated.     [facility] [phone] (phone) [phone] (fax)  [place]

## 2022-12-27 NOTE — Telephone Encounter (Signed)
Patient called and pharmacy her paxlovid was sent to does not have in stock. Resent to alternative pharmacy.

## 2022-12-27 NOTE — Assessment & Plan Note (Signed)
Paxlovid sent in  Phenergan dm as needed  Quarantine for 5 days ---- if symptoms are improving she can leave the house with mask for 5 more days

## 2022-12-28 NOTE — Telephone Encounter (Signed)
Initial Comment Caller states her Rx isn't available at [facility] where the Rx was called in. Caller states she would like to [facility] Translation No Nurse Assessment Nurse: Alyson Ingles, RN, Wells Guiles Date/Time (Eastern Time): 12/27/2022 7:44:13 PM Confirm and document reason for call. If symptomatic, describe symptoms. ---Caller states that her Rx isn't available at [facility] until tomorrow where the Rx was called in. [facility] [address]. Rx is Paxlovid. Denies new/worsening symptoms. Does the patient have any new or worsening symptoms? ---No Nurse: Alyson Ingles, RN, Wells Guiles Date/Time (Eastern Time): 12/27/2022 7:47:32 PM Please select the assessment type ---RX called in but not at pharm Additional Documentation ---Caller states that her Rx isn't available at [facility] until tomorrow where the Rx was called in. [facility] [address] in [facility]. Document the name of the medication. ---Paxlovid Pharmacy name and phone number. ---[facility] [address] [phone] Has the office closed within the last 30 minutes? ---No Does the client directives allow for assistance with medications after hours? ---Yes Is there an on-call physician for the client? ---Yes PLEASE NOTE: All timestamps contained within this report are represented as [place] Standard Time. CONFIDENTIALTY NOTICE: This fax transmission is intended only for the addressee. It contains information that is legally privileged, confidential or otherwise protected from use or disclosure. If you are not the intended recipient, you are strictly prohibited from reviewing, disclosing, copying using or disseminating any of this information or taking any action in reliance on or regarding this information. If you have received this fax in error, please notify us immediately by telephone so that we can arrange for its return to [place].  Phone: [phone], Toll-Free: [phone], Fax: [phone] Page: 2 of 2 Call Id: [id number] [place]. Time Eilene Ghazi Time) Disposition Final User 12/27/2022 7:39:22 PM Send To Nurse Ferne Coe, RN, [facility] 12/27/2022 7:49:38 PM Clinical Call Yes Alyson Ingles, RN, Wells Guiles 12/27/2022 7:51:17 PM Paged On Call back to [facility], [place], Wells Guiles Final Disposition 12/27/2022 7:49:38 PM Clinical Call Yes Alyson Ingles, RN, Romualdo Bolk Understands Yes PreDisposition Call Pharmacist Paging DoctorName Phone DateTime Result/ Outcome Message Type Notes Queen Slough [id number] 12/27/2022 7:51:17 PM Called On Call Provider - Left Message Doctor Paged Queen Slough 12/27/2022 8:02:20 PM Spoke with On Call - General Message Result Spoke with on-call provider, who advised that [facility]. I called pt back to relay this, she verbalized understanding

## 2022-12-29 ENCOUNTER — Encounter: Payer: Self-pay | Admitting: Family Medicine

## 2022-12-29 ENCOUNTER — Ambulatory Visit: Payer: Medicare HMO | Admitting: Cardiology

## 2023-01-14 DIAGNOSIS — Z1231 Encounter for screening mammogram for malignant neoplasm of breast: Secondary | ICD-10-CM | POA: Diagnosis not present

## 2023-01-14 DIAGNOSIS — Z803 Family history of malignant neoplasm of breast: Secondary | ICD-10-CM | POA: Diagnosis not present

## 2023-01-14 LAB — HM MAMMOGRAPHY

## 2023-02-08 ENCOUNTER — Encounter: Payer: HMO | Admitting: Family Medicine

## 2023-02-17 ENCOUNTER — Other Ambulatory Visit: Payer: Self-pay | Admitting: Family Medicine

## 2023-02-17 ENCOUNTER — Encounter: Payer: Self-pay | Admitting: Family Medicine

## 2023-02-17 ENCOUNTER — Telehealth: Payer: Self-pay | Admitting: Family Medicine

## 2023-02-17 DIAGNOSIS — I10 Essential (primary) hypertension: Secondary | ICD-10-CM

## 2023-02-17 MED ORDER — OLMESARTAN MEDOXOMIL 40 MG PO TABS: 40.0000 mg | ORAL_TABLET | Freq: Every day | ORAL | 2 refills | Status: DC

## 2023-02-17 NOTE — Telephone Encounter (Signed)
Contacted Minette Brine to schedule their annual wellness visit. Appointment made for 02/28/2023.  Verlee Rossetti; Care Guide Ambulatory Clinical Support [place] [facility] Direct Dial: [phone]

## 2023-02-24 DIAGNOSIS — Z1283 Encounter for screening for malignant neoplasm of skin: Secondary | ICD-10-CM | POA: Diagnosis not present

## 2023-02-24 DIAGNOSIS — D485 Neoplasm of uncertain behavior of skin: Secondary | ICD-10-CM | POA: Diagnosis not present

## 2023-02-24 DIAGNOSIS — D1801 Hemangioma of skin and subcutaneous tissue: Secondary | ICD-10-CM | POA: Diagnosis not present

## 2023-02-24 DIAGNOSIS — L570 Actinic keratosis: Secondary | ICD-10-CM | POA: Diagnosis not present

## 2023-02-24 DIAGNOSIS — L814 Other melanin hyperpigmentation: Secondary | ICD-10-CM | POA: Diagnosis not present

## 2023-02-27 ENCOUNTER — Encounter: Payer: Self-pay | Admitting: Family Medicine

## 2023-03-10 ENCOUNTER — Encounter: Payer: Self-pay | Admitting: Family Medicine

## 2023-03-10 MED ORDER — CARVEDILOL 12.5 MG PO TABS: 12.5000 mg | ORAL_TABLET | Freq: Two times a day (BID) | ORAL | 0 refills | Status: DC

## 2023-03-16 ENCOUNTER — Encounter: Payer: Self-pay | Admitting: Cardiovascular Disease

## 2023-03-16 ENCOUNTER — Ambulatory Visit: Payer: HMO | Attending: Cardiology | Admitting: Cardiovascular Disease

## 2023-03-16 VITALS — BP 168/82 | HR 54 | Ht 61.0 in | Wt 116.0 lb

## 2023-03-16 DIAGNOSIS — I10 Essential (primary) hypertension: Secondary | ICD-10-CM

## 2023-03-16 DIAGNOSIS — Z8249 Family history of ischemic heart disease and other diseases of the circulatory system: Secondary | ICD-10-CM | POA: Diagnosis not present

## 2023-03-16 DIAGNOSIS — E782 Mixed hyperlipidemia: Secondary | ICD-10-CM

## 2023-03-16 DIAGNOSIS — R931 Abnormal findings on diagnostic imaging of heart and coronary circulation: Secondary | ICD-10-CM

## 2023-03-16 NOTE — Assessment & Plan Note (Signed)
Strong family history of CAD with father died of a myocardial infarction at age 76, mother had bypass surgery as did her brother.  Her son had bypass surgery at age 13.

## 2023-03-16 NOTE — Assessment & Plan Note (Signed)
History of hyperlipidemia on statin therapy with lipid profile performed 12/23/2022 revealing total cholesterol 147, LDL 67 and HDL 69, at goal for secondary prevention.

## 2023-03-16 NOTE — Patient Instructions (Signed)
Medication Instructions:  Your physician recommends that you continue on your current medications as directed. Please refer to the Current Medication list given to you today.  *If you need a refill on your cardiac medications before your next appointment, please call your pharmacy*   Follow-Up: At [facility], you and your health needs are our priority.  As part of our continuing mission to provide you with exceptional heart care, we have created designated Provider Care Teams.  These Care Teams include your primary Cardiologist (physician) and Advanced Practice Providers (APPs -  Physician Assistants and Nurse Practitioners) who all work together to provide you with the care you need, when you need it.  We recommend signing up for the patient portal called "MyChart".  Sign up information is provided on this After Visit Summary.  MyChart is used to connect with patients for Virtual Visits (Telemedicine).  Patients are able to view lab/test results, encounter notes, upcoming appointments, etc.  Non-urgent messages can be sent to your provider as well.   To learn more about what you can do with MyChart, go to [email or web address].    Your next appointment:   12 month(s)  Provider:   Nanetta Batty, MD    Other Instructions Dr. Allyson Sabal has requested that you schedule an appointment with one of our clinical pharmacists for a blood pressure check appointment within the next 4 weeks.  If you monitor your blood pressure (BP) at home, please bring your BP cuff and your BP readings with you to this appointment  HOW TO TAKE YOUR BLOOD PRESSURE: Rest 5 minutes before taking your blood pressure. Don't smoke or drink caffeinated beverages for at least 30 minutes before. Take your blood pressure before (not after) you eat. Sit comfortably with your back supported and both feet on the floor (don't cross your legs). Elevate your arm to heart level on a table or a desk. Use the proper sized  cuff. It should fit smoothly and snugly around your bare upper arm. There should be enough room to slip a fingertip under the cuff. The bottom edge of the cuff should be 1 inch above the crease of the elbow. Ideally, take 3 measurements at one sitting and record the average.

## 2023-03-16 NOTE — Progress Notes (Signed)
03/16/2023 [name]   1946-07-16  [id number]  Primary Physician Donato Schultz, DO Primary Cardiologist: Runell Gess MD Nicholes Calamity, [place]  HPI:  Natasha Trujillo is a 77 y.o. thin and fit appearing married Caucasian female mother of 3, grandmother of 3 grandchildren who is husband Baldo Ash is also a patient of mine.  She is transitioning from Dr. Katrinka Blazing to me after his recent retirement.  She is retired from being in Airline pilot for [employer].  She smoked remotely as a young adult.  Her risk factors include treated hypertension and hyperlipidemia as well as strong family history of heart disease.  Her father had a myocardial infarction when she did not survive at age 62.  Her mother had bypass surgery as did her brother and son.  She has never had a heart attack or stroke.  She is fairly active her husband 4 miles 5 days a week.  She did have a normal 2D echo back in 2019 as well as a normal GXT.  She had a coronary calcium score performed 01/07/2021 which was elevated at 173 most of which was in the left main and LAD.  Current Meds  Medication Sig   aspirin 81 MG EC tablet Take 81 mg by mouth daily.   CALCIUM PO Take by mouth. Pt states taking 1000 MG daily   carvedilol (COREG) 12.5 MG tablet Take 1 tablet (12.5 mg total) by mouth 2 (two) times daily.   hydrochlorothiazide (HYDRODIURIL) 25 MG tablet Take 0.5 tablets (12.5 mg total) by mouth daily.   Multiple Vitamin (MULTIVITAMIN) capsule Take 1 capsule by mouth daily.   olmesartan (BENICAR) 40 MG tablet Take 1 tablet (40 mg total) by mouth daily.   rosuvastatin (CRESTOR) 20 MG tablet Take 1 tablet by mouth once daily   VITAMIN D PO Take by mouth. Pt states taking 600 g daily     Allergies  Allergen Reactions   Lisinopril Other (See Comments)    Throat swelling   Adrenalin [Epinephrine] Other (See Comments)    dizziness   Amlodipine     LE edema on 10mg  dose, tolerates 5mg  dose well   Atorvastatin      Cramping in legs on 40mg  dose    Social History   Socioeconomic History   Marital status: Married    Spouse name: Not on file   Number of children: Not on file   Years of education: Not on file   Highest education level: Not on file  Occupational History   Occupation: retired    Associate Professor: RETIRED  Tobacco Use   Smoking status: Never   Smokeless tobacco: Never  Vaping Use   Vaping Use: Never used  Substance and Sexual Activity   Alcohol use: Yes    Comment: occasional   Drug use: No   Sexual activity: Yes    Partners: Male  Other Topics Concern   Not on file  Social History Narrative   Exercise--walk 4 miles 5 days a week   Social Determinants of Health   Financial Resource Strain: Low Risk  (02/25/2022)   Overall Financial Resource Strain (CARDIA)    Difficulty of Paying Living Expenses: Not hard at all  Food Insecurity: No Food Insecurity (02/25/2022)   Hunger Vital Sign    Worried About Running Out of Food in the Last Year: Never true    Ran Out of Food in the Last Year: Never true  Transportation Needs: No Transportation Needs (  02/25/2022)   PRAPARE - Administrator, Civil Service (Medical): No    Lack of Transportation (Non-Medical): No  Physical Activity: Sufficiently Active (02/25/2022)   Exercise Vital Sign    Days of Exercise per Week: 5 days    Minutes of Exercise per Session: 60 min  Stress: No Stress Concern Present (02/25/2022)   [name] of Occupational Health - Occupational Stress Questionnaire    Feeling of Stress : Not at all  Social Connections: Not on file  Intimate Partner Violence: Not on file     Review of Systems: General: negative for chills, fever, night sweats or weight changes.  Cardiovascular: negative for chest pain, dyspnea on exertion, edema, orthopnea, palpitations, paroxysmal nocturnal dyspnea or shortness of breath Dermatological: negative for rash Respiratory: negative for cough or wheezing Urologic: negative  for hematuria Abdominal: negative for nausea, vomiting, diarrhea, bright red blood per rectum, melena, or hematemesis Neurologic: negative for visual changes, syncope, or dizziness All other systems reviewed and are otherwise negative except as noted above.    Blood pressure (!) 168/82, pulse (!) 54, height 5\' 1"  (1.549 m), weight 116 lb (52.6 kg).  General appearance: alert and no distress Neck: no adenopathy, no carotid bruit, no JVD, supple, symmetrical, trachea midline, and thyroid not enlarged, symmetric, no tenderness/mass/nodules Lungs: clear to auscultation bilaterally Heart: regular rate and rhythm, S1, S2 normal, no murmur, click, rub or gallop Extremities: extremities normal, atraumatic, no cyanosis or edema Pulses: 2+ and symmetric Skin: Skin color, texture, turgor normal. No rashes or lesions Neurologic: Grossly normal  EKG sinus bradycardia at 54 without ST or T wave changes.  I personally reviewed this EKG.  ASSESSMENT AND PLAN:   Hyperlipidemia History of hyperlipidemia on statin therapy with lipid profile performed 12/23/2022 revealing total cholesterol 147, LDL 67 and HDL 69, at goal for secondary prevention.  Primary hypertension History of essential hypertension blood pressure measured at 168/82.  She is on carvedilol, HydroDIURIL and olmesartan.  I have asked her to keep a 30-day blood pressure log and she will see a Pharm.D. back in 4 weeks to review and make it appropriate medication changes.  Family history of early CAD Strong family history of CAD with father died of a myocardial infarction at age 78, mother had bypass surgery as did her brother.  Her son had bypass surgery at age 48.  Elevated coronary artery calcium score Coronary calcium score performed 02/03/2021 was 173 most of which was in the left main and LAD.  She is completely asymptomatic.  Based on this the recommendation was for her LDL to be less than 70 on statin therapy which she  does.     Runell Gess MD FACP,FACC,FAHA, [facility] 03/16/2023 12:38 PM

## 2023-03-16 NOTE — Assessment & Plan Note (Signed)
Coronary calcium score performed 02/03/2021 was 173 most of which was in the left main and LAD.  She is completely asymptomatic.  Based on this the recommendation was for her LDL to be less than 70 on statin therapy which she does.

## 2023-03-16 NOTE — Assessment & Plan Note (Signed)
History of essential hypertension blood pressure measured at 168/82.  She is on carvedilol, HydroDIURIL and olmesartan.  I have asked her to keep a 30-day blood pressure log and she will see a Pharm.D. back in 4 weeks to review and make it appropriate medication changes.

## 2023-04-01 DIAGNOSIS — H40053 Ocular hypertension, bilateral: Secondary | ICD-10-CM | POA: Diagnosis not present

## 2023-04-01 DIAGNOSIS — H5203 Hypermetropia, bilateral: Secondary | ICD-10-CM | POA: Diagnosis not present

## 2023-04-01 DIAGNOSIS — H43393 Other vitreous opacities, bilateral: Secondary | ICD-10-CM | POA: Diagnosis not present

## 2023-04-01 DIAGNOSIS — H524 Presbyopia: Secondary | ICD-10-CM | POA: Diagnosis not present

## 2023-04-01 DIAGNOSIS — H52223 Regular astigmatism, bilateral: Secondary | ICD-10-CM | POA: Diagnosis not present

## 2023-04-01 DIAGNOSIS — H53143 Visual discomfort, bilateral: Secondary | ICD-10-CM | POA: Diagnosis not present

## 2023-04-12 ENCOUNTER — Encounter: Payer: Self-pay | Admitting: Cardiovascular Disease

## 2023-04-12 MED ORDER — CARVEDILOL 12.5 MG PO TABS: 12.5000 mg | ORAL_TABLET | Freq: Two times a day (BID) | ORAL | 0 refills | Status: DC

## 2023-04-13 ENCOUNTER — Telehealth: Payer: Self-pay | Admitting: Family Medicine

## 2023-04-13 NOTE — Telephone Encounter (Signed)
Copied from CRM [id number]. Topic: Medicare AWV >> Apr 13, 2023  9:26 AM Payton Doughty wrote: Reason for CRM: LM 04/13/2023 to schedule AWV   Verlee Rossetti; Care Guide Ambulatory Clinical Support [place] [facility] Direct Dial: [phone]

## 2023-04-18 NOTE — Progress Notes (Unsigned)
Office Visit    Patient Name: Natasha Trujillo Date of Encounter: 04/21/2023  Primary Care Provider:  Donato Schultz, DO Primary Cardiologist:  None  Chief Complaint    Hypertension  Significant Past Medical History   HLD 2/24 LDL 67 -  on rosuvastatin 20  CAD Calcium score 173 (mostly L main and LAD)    Allergies  Allergen Reactions   Lisinopril Other (See Comments)    Throat swelling   Adrenalin [Epinephrine] Other (See Comments)    dizziness   Amlodipine     LE edema on 10mg  dose, tolerates 5mg  dose well   Atorvastatin     Cramping in legs on 40mg  dose    History of Present Illness    Natasha Trujillo is a 77 y.o. female patient of Dr Allyson Sabal, in the office today for hypertension evaluation.  She was seen by Dr. Allyson Sabal last month and found to have a BP of 168/82.  He asked that she keep a log of home readings and return in one month for PharmD visit.  Toady she notes that her hypertension was first diagnosed around 2000, and although it has not been at goal, it has been "close".   She has been using her husband's Omron device, which was validated when he took to his MD appointment.  She doesn't like it, as she feels the pre-molded cuff is hard to get on and she isn't sure about the fit.  She does have a small/normal arm, not such that she would need a child cuff.    Blood Pressure Goal:  130/80  Current Medications:  carvedilol 12.5 mg bid, hctz 25 mg qd - am, olmesartan 40 mg qd - pm  Previously tried:  amlodipine - edema with higher dose; lisinopril - throat swelling  Family Hx:  both parents had hypertension, father died MI, mom with CABG at 12 ish died at 36 old age; brother with CABG (44's), also a DM1 from 43 now 37, 1 son CABG (12);   Social Hx:      Tobacco:no  Alcohol: occasional  Caffeine: quit after COVID (this Feb) - BP dropped after COvid, thought it was in part due to caffeine; now drinks herbal teas  Diet:  mostly home cooked;  went  plant based for almost 2 years, now does some chicken and salmon but rare pork or beef; not vegan  Exercise: 5 days walks 4 miles - 10-14,000 steps per day  Home BP readings:  Omron cuff - about 71-6 years old   Accessory Clinical Findings    Lab Results  Component Value Date   CREATININE 0.59 12/23/2022   BUN 11 12/23/2022   NA 137 12/23/2022   K 3.9 12/23/2022   CL 99 12/23/2022   CO2 30 12/23/2022   Lab Results  Component Value Date   ALT 15 12/23/2022   AST 19 12/23/2022   ALKPHOS 52 12/23/2022   BILITOT 0.5 12/23/2022   Lab Results  Component Value Date   HGBA1C 5.9 03/06/2018    Home Medications    Current Outpatient Medications  Medication Sig Dispense Refill   aspirin 81 MG EC tablet Take 81 mg by mouth daily.     CALCIUM PO Take by mouth. Pt states taking 1000 MG daily     carvedilol (COREG) 12.5 MG tablet Take 1 tablet (12.5 mg total) by mouth 2 (two) times daily. 90 tablet 0   hydrochlorothiazide (HYDRODIURIL) 25 MG tablet Take 0.5 tablets (12.5  mg total) by mouth daily. 30 tablet 5   Multiple Vitamin (MULTIVITAMIN) capsule Take 1 capsule by mouth daily.     olmesartan (BENICAR) 40 MG tablet Take 1 tablet (40 mg total) by mouth daily. 90 tablet 2   rosuvastatin (CRESTOR) 20 MG tablet Take 1 tablet by mouth once daily 90 tablet 3   VITAMIN D PO Take by mouth. Pt states taking 600 g daily     No current facility-administered medications for this visit.      Assessment & Plan    Primary hypertension Assessment: BP is uncontrolled in office BP 143/75 mmHg;  above the goal (<130/80). Home readings much better, average 125/72 Tolerates current medications well without any side effects Denies SOB, palpitation, chest pain, headaches,or swelling Reiterated the importance of regular exercise and low salt diet   Plan:  Continue taking current medications for now. She will get a cuff that is easier for her to use (arm cuff).  If it matches her husbands, she  will continue with current medications.  If it reads differently, she will reach out to office.  Patient to keep record of BP readings with heart rate and report to [place] at the next visit Patient to follow up with Dr. Allyson Sabal in 1 year or [place] earlier if hypertension questions arise. Labs ordered today:  none   Phillips Hay PharmD CPP [facility]  [address] [place], [place] [address] [phone]

## 2023-04-19 ENCOUNTER — Ambulatory Visit: Payer: HMO | Attending: Internal Medicine | Admitting: Pharmacist Clinician (PhC)/ Clinical Pharmacy Specialist

## 2023-04-19 VITALS — BP 143/75 | HR 58

## 2023-04-19 DIAGNOSIS — I10 Essential (primary) hypertension: Secondary | ICD-10-CM

## 2023-04-19 NOTE — Patient Instructions (Signed)
Get a new home BP device.  Measure your upper arm at the mid-point to be sure your new cuff is the right size.     Take your BP meds as follows: continue with your current medications  Check your blood pressure at home daily and keep record of the readings.  Hypertension "High blood pressure"  Hypertension is often called "The Silent Killer." It rarely causes symptoms until it is extremely  high or has done damage to other organs in the body. For this reason, you should have your  blood pressure checked regularly by your physician. We will check your blood pressure  every time you see a provider at one of our offices.   Your blood pressure reading consists of two numbers. Ideally, blood pressure should be  below 120/80. The first ("top") number is called the systolic pressure. It measures the  pressure in your arteries as your heart beats. The second ("bottom") number is called the diastolic pressure. It measures the pressure in your arteries as the heart relaxes between beats.  The benefits of getting your blood pressure under control are enormous. A 10-point  reduction in systolic blood pressure can reduce your risk of stroke by 27% and heart failure by 28%  Your blood pressure goal is 130/80  To check your pressure at home you will need to:  1. Sit up in a chair, with feet flat on the floor and back supported. Do not cross your ankles or legs. 2. Rest your left arm so that the cuff is about heart level. If the cuff goes on your upper arm,  then just relax the arm on the table, arm of the chair or your lap. If you have a wrist cuff, we  suggest relaxing your wrist against your chest (think of it as Pledging the Flag with the  wrong arm).  3. Place the cuff snugly around your arm, about 1 inch above the crook of your elbow. The  cords should be inside the groove of your elbow.  4. Sit quietly, with the cuff in place, for about 5 minutes. After that 5 minutes press the power   button to start a reading. 5. Do not talk or move while the reading is taking place.  6. Record your readings on a sheet of paper. Although most cuffs have a memory, it is often  easier to see a pattern developing when the numbers are all in front of you.  7. You can repeat the reading after 1-3 minutes if it is recommended  Make sure your bladder is empty and you have not had caffeine or tobacco within the last 30 min  Always bring your blood pressure log with you to your appointments. If you have not brought your monitor in to be double checked for accuracy, please bring it to your next appointment.  You can find a list of quality blood pressure cuffs at validatebp.org

## 2023-04-21 ENCOUNTER — Encounter: Payer: Self-pay | Admitting: Pharmacist Clinician (PhC)/ Clinical Pharmacy Specialist

## 2023-04-21 NOTE — Assessment & Plan Note (Signed)
Assessment: BP is uncontrolled in office BP 143/75 mmHg;  above the goal (<130/80). Home readings much better, average 125/72 Tolerates current medications well without any side effects Denies SOB, palpitation, chest pain, headaches,or swelling Reiterated the importance of regular exercise and low salt diet   Plan:  Continue taking current medications for now. She will get a cuff that is easier for her to use (arm cuff).  If it matches her husbands, she will continue with current medications.  If it reads differently, she will reach out to office.  Patient to keep record of BP readings with heart rate and report to [place] at the next visit Patient to follow up with Dr. Allyson Sabal in 1 year or [place] earlier if hypertension questions arise. Labs ordered today:  none

## 2023-05-13 ENCOUNTER — Encounter: Payer: Self-pay | Admitting: Pharmacist Clinician (PhC)/ Clinical Pharmacy Specialist

## 2023-05-13 ENCOUNTER — Other Ambulatory Visit: Payer: Self-pay

## 2023-05-13 MED ORDER — HYDROCHLOROTHIAZIDE 25 MG PO TABS: 12.5000 mg | ORAL_TABLET | Freq: Every day | ORAL | 11 refills | Status: DC

## 2023-05-14 ENCOUNTER — Encounter: Payer: Self-pay | Admitting: Cardiovascular Disease

## 2023-05-15 ENCOUNTER — Encounter: Payer: Self-pay | Admitting: Cardiovascular Disease

## 2023-05-20 ENCOUNTER — Encounter: Payer: Self-pay | Admitting: Cardiovascular Disease

## 2023-05-20 MED ORDER — ROSUVASTATIN CALCIUM 20 MG PO TABS: 20.0000 mg | ORAL_TABLET | Freq: Every day | ORAL | 3 refills | Status: DC

## 2023-05-21 ENCOUNTER — Other Ambulatory Visit: Payer: Self-pay | Admitting: Cardiovascular Disease

## 2023-05-29 ENCOUNTER — Encounter: Payer: Self-pay | Admitting: Cardiovascular Disease

## 2023-06-01 ENCOUNTER — Encounter: Payer: Self-pay | Admitting: Cardiovascular Disease

## 2023-06-01 MED ORDER — HYDROCHLOROTHIAZIDE 25 MG PO TABS: 12.5000 mg | ORAL_TABLET | Freq: Every day | ORAL | 3 refills | Status: DC

## 2023-06-01 MED ORDER — CARVEDILOL 12.5 MG PO TABS: 12.5000 mg | ORAL_TABLET | Freq: Two times a day (BID) | ORAL | 3 refills | Status: DC

## 2023-06-30 ENCOUNTER — Telehealth: Payer: Self-pay | Admitting: Family Medicine

## 2023-06-30 NOTE — Telephone Encounter (Signed)
Copied from CRM [id number]. Topic: Medicare AWV >> Jun 30, 2023  3:39 PM Payton Doughty wrote: Reason for CRM: LM 06/30/2023 to schedule AWV   Verlee Rossetti; Care Guide Ambulatory Clinical Support [place] l [facility] Direct Dial: [phone]

## 2023-10-24 DIAGNOSIS — N958 Other specified menopausal and perimenopausal disorders: Secondary | ICD-10-CM | POA: Diagnosis not present

## 2023-10-24 DIAGNOSIS — M8588 Other specified disorders of bone density and structure, other site: Secondary | ICD-10-CM | POA: Diagnosis not present

## 2023-11-21 ENCOUNTER — Other Ambulatory Visit: Payer: Self-pay | Admitting: Family Medicine

## 2023-11-21 DIAGNOSIS — I10 Essential (primary) hypertension: Secondary | ICD-10-CM

## 2023-12-09 DIAGNOSIS — Z01419 Encounter for gynecological examination (general) (routine) without abnormal findings: Secondary | ICD-10-CM | POA: Diagnosis not present

## 2023-12-09 DIAGNOSIS — Z6822 Body mass index (BMI) 22.0-22.9, adult: Secondary | ICD-10-CM | POA: Diagnosis not present

## 2024-01-05 ENCOUNTER — Encounter: Payer: HMO | Admitting: Family Medicine

## 2024-01-06 ENCOUNTER — Encounter: Payer: Self-pay | Admitting: Family Medicine

## 2024-01-06 ENCOUNTER — Ambulatory Visit (INDEPENDENT_AMBULATORY_CARE_PROVIDER_SITE_OTHER): Payer: HMO | Admitting: Family Medicine

## 2024-01-06 VITALS — BP 140/80 | HR 57 | Temp 97.7°F | Resp 16 | Ht 61.0 in | Wt 118.2 lb

## 2024-01-06 DIAGNOSIS — Z0000 Encounter for general adult medical examination without abnormal findings: Secondary | ICD-10-CM | POA: Diagnosis not present

## 2024-01-06 DIAGNOSIS — E785 Hyperlipidemia, unspecified: Secondary | ICD-10-CM | POA: Diagnosis not present

## 2024-01-06 DIAGNOSIS — M25512 Pain in left shoulder: Secondary | ICD-10-CM | POA: Insufficient documentation

## 2024-01-06 LAB — LIPID PANEL
Cholesterol: 154 mg/dL (ref 0–200)
HDL: 69.2 mg/dL (ref >39.00)
LDL Cholesterol: 74 mg/dL (ref 0–99)
NonHDL: 84.32
Total CHOL/HDL Ratio: 2
Triglycerides: 50 mg/dL (ref 0.0–149.0)
VLDL: 10 mg/dL (ref 0.0–40.0)

## 2024-01-06 LAB — CBC WITH DIFFERENTIAL/PLATELET
Basophils Absolute: 0 10*3/uL (ref 0.0–0.1)
Basophils Relative: 0.8 % (ref 0.0–3.0)
Eosinophils Absolute: 0.5 10*3/uL (ref 0.0–0.7)
Eosinophils Relative: 9.6 % — ABNORMAL HIGH (ref 0.0–5.0)
HCT: 36.3 % (ref 36.0–46.0)
Hemoglobin: 12.1 g/dL (ref 12.0–15.0)
Lymphocytes Relative: 23.4 % (ref 12.0–46.0)
Lymphs Abs: 1.3 10*3/uL (ref 0.7–4.0)
MCHC: 33.4 g/dL (ref 30.0–36.0)
MCV: 96.6 fl (ref 78.0–100.0)
Monocytes Absolute: 0.5 10*3/uL (ref 0.1–1.0)
Monocytes Relative: 8.4 % (ref 3.0–12.0)
Neutro Abs: 3.3 10*3/uL (ref 1.4–7.7)
Neutrophils Relative %: 57.8 % (ref 43.0–77.0)
Platelets: 268 10*3/uL (ref 150.0–400.0)
RBC: 3.76 Mil/uL — ABNORMAL LOW (ref 3.87–5.11)
RDW: 13.6 % (ref 11.5–15.5)
WBC: 5.7 10*3/uL (ref 4.0–10.5)

## 2024-01-06 LAB — COMPREHENSIVE METABOLIC PANEL
ALT: 15 U/L (ref 0–35)
AST: 18 U/L (ref 0–37)
Albumin: 4.4 g/dL (ref 3.5–5.2)
Alkaline Phosphatase: 56 U/L (ref 39–117)
BUN: 11 mg/dL (ref 6–23)
CO2: 33 meq/L — ABNORMAL HIGH (ref 19–32)
Calcium: 9.7 mg/dL (ref 8.4–10.5)
Chloride: 99 meq/L (ref 96–112)
Creatinine, Ser: 0.57 mg/dL (ref 0.40–1.20)
GFR: 88.22 mL/min (ref >60.00)
Glucose, Bld: 93 mg/dL (ref 70–99)
Potassium: 4 meq/L (ref 3.5–5.1)
Sodium: 138 meq/L (ref 135–145)
Total Bilirubin: 0.5 mg/dL (ref 0.2–1.2)
Total Protein: 6.5 g/dL (ref 6.0–8.3)

## 2024-01-06 LAB — TSH: TSH: 1.62 u[IU]/mL (ref 0.35–5.50)

## 2024-01-06 NOTE — Progress Notes (Signed)
 Established Patient Office Visit  Subjective   Patient ID: Natasha Trujillo, female    DOB: 03-Jun-1946  Age: 78 y.o. MRN: [id number]  Chief Complaint  Patient presents with   Annual Exam    Pt states fasting.     HPI Discussed the use of AI scribe software for clinical note transcription with the patient, who gave verbal consent to proceed.  History of Present Illness   Natasha Trujillo is a 78 year old female who presents with left arm pain.  She has been experiencing left arm pain, which occurred on Tuesday and Wednesday earlier this week. The pain was described as an ache located in the left arm, radiating downwards but not upwards to the neck. The pain resolved by Thursday and has not recurred today. No recent unusual physical activity that could have caused the pain. No palpitations, shortness of breath, or new surgeries.  She reports chronic knee pain, attributed to arthritis. She uses Blue Emu on her knees, which provides significant relief. She has a follow-up appointment with her orthopedic specialist scheduled for Tuesday. Her past orthopedic history includes microfracture surgery on her knee in November 2014 and rotator cuff surgery on her shoulder in February 2018.  She mentions experiencing drainage from her left ear for about two weeks, which has since resolved. The drainage was clear and occurred primarily when lying on her left side. No pain associated with the ear drainage.  She received a flu shot at a pharmacy in late July or early August. She is uncertain about her COVID-19 vaccination status but recalls discussing the RSV vaccine with her husband, who is 2 years old.  She is active and participates in excursions during cruises. She travels frequently, including a recent trip to [place] and an upcoming trip to [place] with her family. No changes in family history reported. No changes in her vision or dental health, attending regular check-ups twice a year for  both.      Patient Active Problem List   Diagnosis Date Noted   Acute pain of left shoulder 01/06/2024   Elevated coronary artery calcium score 03/16/2023   COVID-19 12/27/2022   Muscle spasm of left shoulder 07/08/2022   Viral URI with cough 01/13/2022   Hx of adenomatous colonic polyps 04/03/2019   Family history of early CAD 01/24/2018   Strain of right foot 01/03/2017   Right foot pain 12/29/2016   Preventative health care 12/05/2016   Right knee pain 02/03/2015   OSTEOPENIA 03/03/2010   POSTMENOPAUSAL STATUS 07/26/2008   Hyperlipidemia 02/28/2007   Primary hypertension 02/28/2007   COLONOSCOPY, HX OF 02/28/2007   BREAST BIOPSY, HX OF 02/28/2007   Past Medical History:  Diagnosis Date   Hyperlipidemia    Hypertension    Osteopenia    Osteoporosis    Past Surgical History:  Procedure Laterality Date   BLEPHAROPLASTY Bilateral 08/2016   Dr Rozanna Box   KNEE SURGERY Left 09/12/13   SHOULDER SURGERY Right 01/05/2017   Dr.Caffrey   TUBAL LIGATION     Social History   Tobacco Use   Smoking status: Never   Smokeless tobacco: Never  Vaping Use   Vaping status: Never Used  Substance Use Topics   Alcohol use: Yes    Comment: occasional   Drug use: No   Social History   Socioeconomic History   Marital status: Married    Spouse name: Not on file   Number of children: Not on file   Years of  education: Not on file   Highest education level: 12th grade  Occupational History   Occupation: retired    Associate Professor: RETIRED  Tobacco Use   Smoking status: Never   Smokeless tobacco: Never  Vaping Use   Vaping status: Never Used  Substance and Sexual Activity   Alcohol use: Yes    Comment: occasional   Drug use: No   Sexual activity: Yes    Partners: Male  Other Topics Concern   Not on file  Social History Narrative   Exercise--walk 4 miles 5 days a week   Social Drivers of Corporate investment banker Strain: Low Risk  (01/01/2024)   Overall Financial  Resource Strain (CARDIA)    Difficulty of Paying Living Expenses: Not hard at all  Food Insecurity: No Food Insecurity (01/01/2024)   Hunger Vital Sign    Worried About Running Out of Food in the Last Year: Never true    Ran Out of Food in the Last Year: Never true  Transportation Needs: No Transportation Needs (01/01/2024)   PRAPARE - Administrator, Civil Service (Medical): No    Lack of Transportation (Non-Medical): No  Physical Activity: Sufficiently Active (01/01/2024)   Exercise Vital Sign    Days of Exercise per Week: 5 days    Minutes of Exercise per Session: 70 min  Stress: No Stress Concern Present (01/01/2024)   [name] of Occupational Health - Occupational Stress Questionnaire    Feeling of Stress : Not at all  Social Connections: Socially Integrated (01/01/2024)   Social Connection and Isolation Panel [NHANES]    Frequency of Communication with Friends and Family: Three times a week    Frequency of Social Gatherings with Friends and Family: Once a week    Attends Religious Services: More than 4 times per year    Active Member of [employer] or Organizations: Yes    Attends Engineer, structural: More than 4 times per year    Marital Status: Married  Catering manager Violence: Not on file   Family Status  Relation Name Status   Mother  Deceased   Father  Deceased   Brother  Alive   Mat Aunt  Deceased   Other nephew Deceased   Other  (Not Specified)   Other  (Not Specified)   Other  (Not Specified)   Son  Alive   Neg Hx  (Not Specified)  No partnership data on file   Family History  Problem Relation Age of Onset   Dementia Mother    Hypertension Mother    Heart disease Mother 25       CABG   Hypertension Father    Heart disease Father 71       MI   Diabetes Brother 13       type 1   Hypertension Brother    Heart disease Brother        cabg   Cancer Maternal Aunt 9       cervical   Diabetes Other        committed suicide    Coronary artery disease Other        female 1st degree relative <50/female 1st degree relative <60   Cervical cancer Other    Lung cancer Other    Heart attack Son 55   Colon cancer Neg Hx    Esophageal cancer Neg Hx    Rectal cancer Neg Hx    Stomach cancer Neg Hx    Allergies  Allergen Reactions   Lisinopril Other (See Comments)    Throat swelling   Adrenalin [Epinephrine] Other (See Comments)    dizziness   Amlodipine     LE edema on 10mg  dose, tolerates 5mg  dose well   Atorvastatin     Cramping in legs on 40mg  dose      Review of Systems  Constitutional:  Negative for chills, fever and malaise/fatigue.  HENT:  Negative for congestion and hearing loss.   Eyes:  Negative for blurred vision and discharge.  Respiratory:  Negative for cough, sputum production and shortness of breath.   Cardiovascular:  Negative for chest pain, palpitations and leg swelling.  Gastrointestinal:  Negative for abdominal pain, blood in stool, constipation, diarrhea, heartburn, nausea and vomiting.  Genitourinary:  Negative for dysuria, frequency, hematuria and urgency.  Musculoskeletal:  Negative for back pain, falls and myalgias.  Skin:  Negative for rash.  Neurological:  Negative for dizziness, sensory change, loss of consciousness, weakness and headaches.  Endo/Heme/Allergies:  Negative for environmental allergies. Does not bruise/bleed easily.  Psychiatric/Behavioral:  Negative for depression and suicidal ideas. The patient is not nervous/anxious and does not have insomnia.       Objective:     BP (!) 140/80 (BP Location: Right Arm, Patient Position: Sitting, Cuff Size: Normal)   Pulse (!) 57   Temp 97.7 F (36.5 C) (Oral)   Resp 16   Ht 5\' 1"  (1.549 m)   Wt 118 lb 3.2 oz (53.6 kg)   SpO2 99%   BMI 22.33 kg/m  BP Readings from Last 3 Encounters:  01/06/24 (!) 140/80  04/19/23 (!) 143/75  03/16/23 (!) 168/82   Wt Readings from Last 3 Encounters:  01/06/24 118 lb 3.2 oz (53.6 kg)   03/16/23 116 lb (52.6 kg)  12/27/22 116 lb (52.6 kg)   SpO2 Readings from Last 3 Encounters:  01/06/24 99%  12/27/22 94%  12/23/22 99%      Physical Exam Vitals and nursing note reviewed.  Constitutional:      General: She is not in acute distress.    Appearance: Normal appearance. She is well-developed.  HENT:     Head: Normocephalic and atraumatic.     Right Ear: Tympanic membrane, ear canal and external ear normal. There is no impacted cerumen.     Left Ear: Tympanic membrane, ear canal and external ear normal. There is no impacted cerumen.     Nose: Nose normal.     Mouth/Throat:     Mouth: Mucous membranes are moist.     Pharynx: Oropharynx is clear. No oropharyngeal exudate or posterior oropharyngeal erythema.  Eyes:     General: No scleral icterus.       Right eye: No discharge.        Left eye: No discharge.     Conjunctiva/sclera: Conjunctivae normal.     Pupils: Pupils are equal, round, and reactive to light.  Neck:     Thyroid: No thyromegaly or thyroid tenderness.     Vascular: No JVD.  Cardiovascular:     Rate and Rhythm: Normal rate and regular rhythm.     Heart sounds: Normal heart sounds. No murmur heard. Pulmonary:     Effort: Pulmonary effort is normal. No respiratory distress.     Breath sounds: Normal breath sounds.  Abdominal:     General: Bowel sounds are normal. There is no distension.     Palpations: Abdomen is soft. There is no mass.     Tenderness: There  is no abdominal tenderness. There is no guarding or rebound.  Musculoskeletal:        General: Normal range of motion.     Cervical back: Normal range of motion and neck supple.     Right lower leg: No edema.     Left lower leg: No edema.  Lymphadenopathy:     Cervical: No cervical adenopathy.  Skin:    General: Skin is warm and dry.     Findings: No erythema or rash.  Neurological:     Mental Status: She is alert and oriented to person, place, and time.     Cranial Nerves: No cranial  nerve deficit.     Deep Tendon Reflexes: Reflexes are normal and symmetric.  Psychiatric:        Mood and Affect: Mood normal.        Behavior: Behavior normal.        Thought Content: Thought content normal.        Judgment: Judgment normal.      No results found for any visits on 01/06/24.  Last CBC Lab Results  Component Value Date   WBC 6.1 12/23/2022   HGB 12.7 12/23/2022   HCT 37.8 12/23/2022   MCV 96.1 12/23/2022   MCH 32.3 09/13/2019   RDW 12.8 12/23/2022   PLT 290.0 12/23/2022   Last metabolic panel Lab Results  Component Value Date   GLUCOSE 92 12/23/2022   NA 137 12/23/2022   K 3.9 12/23/2022   CL 99 12/23/2022   CO2 30 12/23/2022   BUN 11 12/23/2022   CREATININE 0.59 12/23/2022   GFR 88.13 12/23/2022   CALCIUM 9.3 12/23/2022   PROT 6.5 12/23/2022   ALBUMIN 4.3 12/23/2022   BILITOT 0.5 12/23/2022   ALKPHOS 52 12/23/2022   AST 19 12/23/2022   ALT 15 12/23/2022   Last lipids Lab Results  Component Value Date   CHOL 147 12/23/2022   HDL 69.00 12/23/2022   LDLCALC 67 12/23/2022   TRIG 57.0 12/23/2022   CHOLHDL 2 12/23/2022   Last hemoglobin A1c Lab Results  Component Value Date   HGBA1C 5.9 03/06/2018   Last thyroid functions Lab Results  Component Value Date   TSH 1.68 05/27/2017   Last vitamin D Lab Results  Component Value Date   VD25OH 65 03/03/2010   Last vitamin B12 and Folate Lab Results  Component Value Date   VITAMINB12 467 07/19/2014      The 10-year ASCVD risk score (Arnett DK, et al., 2019) is: 27.1%    Assessment & Plan:   Problem List Items Addressed This Visit       Unprioritized   Acute pain of left shoulder   EKG --- sinus brady Pt has app with ortho coming up       Relevant Orders   EKG 12-Lead (Completed)   Hyperlipidemia   Encourage heart healthy diet such as MIND or DASH diet, increase exercise, avoid trans fats, simple carbohydrates and processed foods, consider a krill or fish or flaxseed oil cap  daily.        Relevant Orders   CBC with Differential/Platelet   Comprehensive metabolic panel   Lipid panel   TSH   Preventative health care - Primary   Ghm utd Check labs  See AVS Health Maintenance  Topic Date Due   Medicare Annual Wellness (AWV)  02/26/2023   INFLUENZA VACCINE  06/09/2023   COVID-19 Vaccine (6 - 2024-25 season) 07/10/2023   Colonoscopy  03/27/2024  MAMMOGRAM  01/14/2024   DTaP/Tdap/Td (4 - Tdap) 12/17/2029   Pneumonia Vaccine 75+ Years old  Completed   DEXA SCAN  Completed   Hepatitis C Screening  Completed   Zoster Vaccines- Shingrix  Completed   HPV VACCINES  Aged Out          No follow-ups on file.    Donato Schultz, DO

## 2024-01-06 NOTE — Assessment & Plan Note (Signed)
 Ghm utd Check labs  See AVS Health Maintenance  Topic Date Due   Medicare Annual Wellness (AWV)  02/26/2023   INFLUENZA VACCINE  06/09/2023   COVID-19 Vaccine (6 - 2024-25 season) 07/10/2023   Colonoscopy  03/27/2024   MAMMOGRAM  01/14/2024   DTaP/Tdap/Td (4 - Tdap) 12/17/2029   Pneumonia Vaccine 29+ Years old  Completed   DEXA SCAN  Completed   Hepatitis C Screening  Completed   Zoster Vaccines- Shingrix  Completed   HPV VACCINES  Aged Out

## 2024-01-06 NOTE — Assessment & Plan Note (Signed)
 Encourage heart healthy diet such as MIND or DASH diet, increase exercise, avoid trans fats, simple carbohydrates and processed foods, consider a krill or fish or flaxseed oil cap daily.

## 2024-01-06 NOTE — Assessment & Plan Note (Addendum)
 EKG --- sinus brady Pt has app with ortho coming up

## 2024-01-10 DIAGNOSIS — M25562 Pain in left knee: Secondary | ICD-10-CM | POA: Diagnosis not present

## 2024-01-10 DIAGNOSIS — M25561 Pain in right knee: Secondary | ICD-10-CM | POA: Diagnosis not present

## 2024-01-10 DIAGNOSIS — M25551 Pain in right hip: Secondary | ICD-10-CM | POA: Diagnosis not present

## 2024-01-11 ENCOUNTER — Encounter: Payer: Self-pay | Admitting: Family Medicine

## 2024-01-12 ENCOUNTER — Encounter: Payer: Self-pay | Admitting: Family Medicine

## 2024-02-03 DIAGNOSIS — Z1231 Encounter for screening mammogram for malignant neoplasm of breast: Secondary | ICD-10-CM | POA: Diagnosis not present

## 2024-02-14 ENCOUNTER — Other Ambulatory Visit: Payer: Self-pay | Admitting: Family Medicine

## 2024-02-14 DIAGNOSIS — I10 Essential (primary) hypertension: Secondary | ICD-10-CM

## 2024-03-21 ENCOUNTER — Encounter: Payer: Self-pay | Admitting: Cardiovascular Disease

## 2024-03-21 ENCOUNTER — Ambulatory Visit: Attending: Cardiovascular Disease | Admitting: Cardiovascular Disease

## 2024-03-21 VITALS — BP 159/85 | HR 63 | Ht 61.0 in | Wt 112.0 lb

## 2024-03-21 DIAGNOSIS — R931 Abnormal findings on diagnostic imaging of heart and coronary circulation: Secondary | ICD-10-CM | POA: Diagnosis not present

## 2024-03-21 DIAGNOSIS — I10 Essential (primary) hypertension: Secondary | ICD-10-CM

## 2024-03-21 DIAGNOSIS — E782 Mixed hyperlipidemia: Secondary | ICD-10-CM

## 2024-03-21 NOTE — Assessment & Plan Note (Signed)
 History of hyperlipidemia on rosuvastatin  20 mg a day with lipid profile performed 01/06/2024 revealing a total cholesterol of 154, LDL of 74 and HDL of 69.

## 2024-03-21 NOTE — Assessment & Plan Note (Signed)
 History of essential hypertension blood pressure measured today at 159/85.  This is higher than it usually runs.  She does admit to waking up at night, not sleeping and being somewhat sleep deprived and under stress.  She is on carvedilol , HydroDIURIL  and olmesartan .

## 2024-03-21 NOTE — Progress Notes (Signed)
 03/21/2024 Natasha Trujillo   07-Apr-1946  [id number]  Primary Physician Estill Hemming, DO Primary Cardiologist: Avanell Leigh MD Bennye Bravo, [place]  HPI:  Natasha Trujillo is a 78 y.o.  thin and fit appearing married Caucasian female mother of 3, grandmother of 3 grandchildren who is husband Gorman Laughter is also a patient of mine. She is transitioning from Dr. Felipe Horton to me after his recent retirement.  I last saw her in the office 03/16/2023.  She is retired from being in Airline pilot for [employer]. She smoked remotely as a young adult. Her risk factors include treated hypertension and hyperlipidemia as well as strong family history of heart disease. Her father had a myocardial infarction when she did not survive at age 60. Her mother had bypass surgery as did her brother and son. She has never had a heart attack or stroke. She is fairly active her husband 4 miles 5 days a week. She did have a normal 2D echo back in 2019 as well as a normal GXT. She had a coronary calcium  score performed 01/07/2021 which was elevated at 173 most of which was in the left main and LAD.  Since I saw her a year ago she has seen a Pharm.D. for evaluation of hypertension and for the most part her blood pressures been under better control.  Over the last 3 days she has noted some symptoms compatible with GERD although she has not had this in the past.  She denies chest pain, radiation of the pain to her neck jaw or arm or other associated symptoms.  The pain last for 20 seconds at a time.  If the symptoms persist she may need more thorough investigation to rule out ischemic etiology.   Current Meds  Medication Sig   aspirin 81 MG EC tablet Take 81 mg by mouth daily.   CALCIUM  PO Take by mouth. Pt states taking 1000 MG daily   carvedilol  (COREG ) 12.5 MG tablet Take 1 tablet (12.5 mg total) by mouth 2 (two) times daily with a meal.   hydrochlorothiazide  (HYDRODIURIL ) 25 MG tablet Take 0.5 tablets (12.5 mg  total) by mouth daily.   MAGNESIUM GLYCINATE PO Take by mouth.   Multiple Vitamin (MULTIVITAMIN) capsule Take 1 capsule by mouth daily.   olmesartan  (BENICAR ) 40 MG tablet Take 1 tablet by mouth once daily   rosuvastatin  (CRESTOR ) 20 MG tablet Take 1 tablet (20 mg total) by mouth daily.   VITAMIN D  PO Take by mouth. Pt states taking 600 g daily     Allergies  Allergen Reactions   Lisinopril  Other (See Comments)    Throat swelling   Adrenalin [Epinephrine] Other (See Comments)    dizziness   Amlodipine      LE edema on 10mg  dose, tolerates 5mg  dose well   Atorvastatin      Cramping in legs on 40mg  dose    Social History   Socioeconomic History   Marital status: Married    Spouse name: Not on file   Number of children: Not on file   Years of education: Not on file   Highest education level: 12th grade  Occupational History   Occupation: retired    Associate Professor: RETIRED  Tobacco Use   Smoking status: Never   Smokeless tobacco: Never  Vaping Use   Vaping status: Never Used  Substance and Sexual Activity   Alcohol use: Yes    Comment: occasional   Drug use: No   Sexual  activity: Yes    Partners: Male  Other Topics Concern   Not on file  Social History Narrative   Exercise--walk 4 miles 5 days a week   Social Drivers of Corporate investment banker Strain: Low Risk  (01/01/2024)   Overall Financial Resource Strain (CARDIA)    Difficulty of Paying Living Expenses: Not hard at all  Food Insecurity: No Food Insecurity (01/01/2024)   Hunger Vital Sign    Worried About Running Out of Food in the Last Year: Never true    Ran Out of Food in the Last Year: Never true  Transportation Needs: No Transportation Needs (01/01/2024)   PRAPARE - Administrator, Civil Service (Medical): No    Lack of Transportation (Non-Medical): No  Physical Activity: Sufficiently Active (01/01/2024)   Exercise Vital Sign    Days of Exercise per Week: 5 days    Minutes of Exercise per  Session: 70 min  Stress: No Stress Concern Present (01/01/2024)   [name] of Occupational Health - Occupational Stress Questionnaire    Feeling of Stress : Not at all  Social Connections: Socially Integrated (01/01/2024)   Social Connection and Isolation Panel [NHANES]    Frequency of Communication with Friends and Family: Three times a week    Frequency of Social Gatherings with Friends and Family: Once a week    Attends Religious Services: More than 4 times per year    Active Member of [employer] or Organizations: Yes    Attends Engineer, structural: More than 4 times per year    Marital Status: Married  Catering manager Violence: Not on file     Review of Systems: General: negative for chills, fever, night sweats or weight changes.  Cardiovascular: negative for chest pain, dyspnea on exertion, edema, orthopnea, palpitations, paroxysmal nocturnal dyspnea or shortness of breath Dermatological: negative for rash Respiratory: negative for cough or wheezing Urologic: negative for hematuria Abdominal: negative for nausea, vomiting, diarrhea, bright red blood per rectum, melena, or hematemesis Neurologic: negative for visual changes, syncope, or dizziness All other systems reviewed and are otherwise negative except as noted above.    Blood pressure (!) 159/85, pulse 63, height 5\' 1"  (1.549 m), weight 112 lb (50.8 kg), SpO2 98%.  General appearance: alert and no distress Neck: no adenopathy, no carotid bruit, no JVD, supple, symmetrical, trachea midline, and thyroid  not enlarged, symmetric, no tenderness/mass/nodules Lungs: clear to auscultation bilaterally Heart: regular rate and rhythm, S1, S2 normal, no murmur, click, rub or gallop Extremities: extremities normal, atraumatic, no cyanosis or edema Pulses: 2+ and symmetric Skin: Skin color, texture, turgor normal. No rashes or lesions Neurologic: Grossly normal  EKG not performed today      ASSESSMENT AND PLAN:    Hyperlipidemia History of hyperlipidemia on rosuvastatin  20 mg a day with lipid profile performed 01/06/2024 revealing a total cholesterol of 154, LDL of 74 and HDL of 69.  Primary hypertension History of essential hypertension blood pressure measured today at 159/85.  This is higher than it usually runs.  She does admit to waking up at night, not sleeping and being somewhat sleep deprived and under stress.  She is on carvedilol , HydroDIURIL  and olmesartan .  Elevated coronary artery calcium  score Elevated coronary calcium  score of 173 most of which was in the left main and LAD performed 01/07/2021.  She has had a normal GXT in the past.  She has been asymptomatic until recently when she has noticed some reflux type symptoms over the  last 3 days.  If her symptoms persist she may need more thorough investigation including coronary CTA.     Avanell Leigh MD FACP,FACC,FAHA, [facility] 03/21/2024 10:37 AM

## 2024-03-21 NOTE — Assessment & Plan Note (Signed)
 Elevated coronary calcium  score of 173 most of which was in the left main and LAD performed 01/07/2021.  She has had a normal GXT in the past.  She has been asymptomatic until recently when she has noticed some reflux type symptoms over the last 3 days.  If her symptoms persist she may need more thorough investigation including coronary CTA.

## 2024-03-21 NOTE — Patient Instructions (Signed)
 Medication Instructions:  Your physician recommends that you continue on your current medications as directed. Please refer to the Current Medication list given to you today.  *If you need a refill on your cardiac medications before your next appointment, please call your pharmacy*   Follow-Up: At [facility], you and your health needs are our priority.  As part of our continuing mission to provide you with exceptional heart care, our providers are all part of one team.  This team includes your primary Cardiologist (physician) and Advanced Practice Providers or APPs (Physician Assistants and Nurse Practitioners) who all work together to provide you with the care you need, when you need it.  Your next appointment:   6-8 week(s)  Provider:   Marcie Sever, PA-C, Palmer Bobo, NP, Sharren Decree, PA-C, Hao Meng, PA-C, Marlana Silvan, NP, or Katlyn West, NP         Then, Lauro Portal, MD  will plan to see you again in 12 month(s).     We recommend signing up for the patient portal called "MyChart".  Sign up information is provided on this After Visit Summary.  MyChart is used to connect with patients for Virtual Visits (Telemedicine).  Patients are able to view lab/test results, encounter notes, upcoming appointments, etc.  Non-urgent messages can be sent to your provider as well.   To learn more about what you can do with MyChart, go to [email or web address].

## 2024-04-26 ENCOUNTER — Other Ambulatory Visit: Payer: Self-pay | Admitting: Cardiovascular Disease

## 2024-04-30 ENCOUNTER — Ambulatory Visit: Admitting: Nurse Practitioner

## 2024-04-30 NOTE — Progress Notes (Deleted)
 Office Visit    Patient Name: Natasha Trujillo Date of Encounter: 04/30/2024  Primary Care Provider:  Antonio Meth, Jamee SAUNDERS, DO Primary Cardiologist:  Dorn Lesches, MD  Chief Complaint    78 year old female with a history of elevated coronary artery calcium  score, mitral valve regurgitation, hypertension, and hyperlipidemia who presents for follow-up related to CAD and hypertension.  Past Medical History    Past Medical History:  Diagnosis Date   Hyperlipidemia    Hypertension    Osteopenia    Osteoporosis    Past Surgical History:  Procedure Laterality Date   BLEPHAROPLASTY Bilateral 08/2016   Dr Reyes Pizza   KNEE SURGERY Left 09/12/13   SHOULDER SURGERY Right 01/05/2017   Dr.Caffrey   TUBAL LIGATION      Allergies  Allergies  Allergen Reactions   Lisinopril  Other (See Comments)    Throat swelling   Adrenalin [Epinephrine] Other (See Comments)    dizziness   Amlodipine      LE edema on 10mg  dose, tolerates 5mg  dose well   Atorvastatin      Cramping in legs on 40mg  dose     Labs/Other Studies Reviewed    The following studies were reviewed today:  Cardiac Studies & Procedures   ______________________________________________________________________________________________   STRESS TESTS  EXERCISE TOLERANCE TEST (ETT) 02/09/2018  Narrative  Blood pressure demonstrated a normal response to exercise.  There was no ST segment deviation noted during stress.  No T wave inversion was noted during stress.  Negative, adequate stress test.   ECHOCARDIOGRAM  ECHOCARDIOGRAM COMPLETE 02/09/2018  Narrative *[facility]* [address] [place], [place] [address] [phone]  ------------------------------------------------------------------- Transthoracic Echocardiography  Patient:    Natasha, Trujillo MR #:       [id number] Study Date: 02/09/2018 Gender:     F Age:        71 Height:     157.5 cm Weight:     54.9 kg BSA:         1.55 m^2 Pt. Status: Room:  ATTENDING    Victory MICAEL Sharps, MD TISA     Victory MICAEL Sharps, MD REFERRING    Victory MICAEL Sharps, MD SONOGRAPHER  Sherida Lawyer, RDCS PERFORMING   [facility], Outpatient  cc:  ------------------------------------------------------------------- LV EF: 55% -   60%  ------------------------------------------------------------------- Indications:      Hypertension (I10).  ------------------------------------------------------------------- History:   Risk factors:  Hypertension. Dyslipidemia.  ------------------------------------------------------------------- Study Conclusions  - Left ventricle: The cavity size was normal. Systolic function was normal. The estimated ejection fraction was in the range of 55% to 60%. Wall motion was normal; there were no regional wall motion abnormalities. Doppler parameters are consistent with abnormal left ventricular relaxation (grade 1 diastolic dysfunction). There was no evidence of elevated ventricular filling pressure by Doppler parameters. - Aortic valve: There was no regurgitation. - Mitral valve: There was mild regurgitation. - Right ventricle: The cavity size was normal. Wall thickness was normal. Systolic function was normal. - Right atrium: The atrium was normal in size. - Tricuspid valve: There was mild regurgitation. - Pulmonary arteries: Systolic pressure was mildly increased. PA peak pressure: 38 mm Hg (S). - Inferior vena cava: The vessel was normal in size. - Pericardium, extracardiac: There was no pericardial effusion.  ------------------------------------------------------------------- Study data:  No prior study was available for comparison.  Study status:  Routine.  Procedure:  The patient reported no pain pre or post test. Transthoracic echocardiography. Image quality was adequate.  Transthoracic echocardiography.  M-mode, complete 2D, spectral Doppler, and color Doppler.  Birthdate: Patient  birthdate: [birth year].  Age:  Patient is 78 yr old.  Sex: Gender: female.    BMI: 22.1 kg/m^2.  Blood pressure:     150/84 Patient status:  Outpatient.  Study date:  Study date: 02/09/2018. Study time: 09:54 AM.  Location:  [facility]  -------------------------------------------------------------------  ------------------------------------------------------------------- Left ventricle:  The cavity size was normal. Systolic function was normal. The estimated ejection fraction was in the range of 55% to 60%. Wall motion was normal; there were no regional wall motion abnormalities. Doppler parameters are consistent with abnormal left ventricular relaxation (grade 1 diastolic dysfunction). There was no evidence of elevated ventricular filling pressure by Doppler parameters.  ------------------------------------------------------------------- Aortic valve:   Trileaflet; normal thickness leaflets. Mobility was not restricted.  Doppler:  Transvalvular velocity was within the normal range. There was no stenosis. There was no regurgitation.  ------------------------------------------------------------------- Aorta:  Aortic root: The aortic root was normal in size.  ------------------------------------------------------------------- Mitral valve:   Structurally normal valve.   Mobility was not restricted.  Doppler:  Transvalvular velocity was within the normal range. There was no evidence for stenosis. There was mild regurgitation.    Valve area by pressure half-time: 3.55 cm^2. Indexed valve area by pressure half-time: 2.28 cm^2/m^2.    Peak gradient (D): 3 mm Hg.  ------------------------------------------------------------------- Left atrium:  The atrium was normal in size.  ------------------------------------------------------------------- Right ventricle:  The cavity size was normal. Wall thickness was normal. Systolic function was  normal.  ------------------------------------------------------------------- Pulmonic valve:    Structurally normal valve.   Cusp separation was normal.  Doppler:  Transvalvular velocity was within the normal range. There was no evidence for stenosis. There was no regurgitation.  ------------------------------------------------------------------- Tricuspid valve:   Structurally normal valve.    Doppler: Transvalvular velocity was within the normal range. There was mild regurgitation.  ------------------------------------------------------------------- Pulmonary artery:   The main pulmonary artery was normal-sized. Systolic pressure was mildly increased.  ------------------------------------------------------------------- Right atrium:  The atrium was normal in size.  ------------------------------------------------------------------- Pericardium:  There was no pericardial effusion.  ------------------------------------------------------------------- Systemic veins: Inferior vena cava: The vessel was normal in size.  ------------------------------------------------------------------- Measurements  Left ventricle                         Value          Reference LV ID, ED, PLAX chordal                47    mm       43 - 52 LV ID, ES, PLAX chordal                30    mm       23 - 38 LV fx shortening, PLAX chordal         36    %        >=29 LV PW thickness, ED                    8     mm       --------- IVS/LV PW ratio, ED                    1              <=1.3 Stroke volume, 2D  93    ml       --------- Stroke volume/bsa, 2D                  60    ml/m^2   --------- LV e&', lateral                         7.9   cm/s     --------- LV E/e&', lateral                       10.75          --------- LV e&', medial                          4.6   cm/s     --------- LV E/e&', medial                        18.46          --------- LV e&', average                          6.25  cm/s     --------- LV E/e&', average                       13.58          ---------  Ventricular septum                     Value          Reference IVS thickness, ED                      8     mm       ---------  LVOT                                   Value          Reference LVOT ID, S                             21    mm       --------- LVOT area                              3.46  cm^2     --------- LVOT peak velocity, S                  117   cm/s     --------- LVOT mean velocity, S                  77.6  cm/s     --------- LVOT VTI, S                            26.8  cm       --------- LVOT peak gradient, S                  5     mm Hg    ---------  Aorta  Value          Reference Aortic root ID, ED                     33    mm       ---------  Left atrium                            Value          Reference LA ID, A-P, ES                         31    mm       --------- LA ID/bsa, A-P                         1.99  cm/m^2   <=2.2 LA volume, S                           43.8  ml       --------- LA volume/bsa, S                       28.2  ml/m^2   --------- LA volume, ES, 1-p A4C                 35.3  ml       --------- LA volume/bsa, ES, 1-p A4C             22.7  ml/m^2   --------- LA volume, ES, 1-p A2C                 45.4  ml       --------- LA volume/bsa, ES, 1-p A2C             29.2  ml/m^2   ---------  Mitral valve                           Value          Reference Mitral E-wave peak velocity            84.9  cm/s     --------- Mitral A-wave peak velocity            89.7  cm/s     --------- Mitral deceleration time               211   ms       150 - 230 Mitral pressure half-time              62    ms       --------- Mitral peak gradient, D                3     mm Hg    --------- Mitral E/A ratio, peak                 0.9            --------- Mitral valve area, PHT, DP             3.55  cm^2     --------- Mitral valve area/bsa,  PHT, DP         2.28  cm^2/m^2 ---------  Pulmonary arteries  Value          Reference PA pressure, S, DP             (H)     38    mm Hg    <=30  Tricuspid valve                        Value          Reference Tricuspid regurg peak velocity         274   cm/s     --------- Tricuspid peak RV-RA gradient          30    mm Hg    ---------  Systemic veins                         Value          Reference Estimated CVP                          8     mm Hg    ---------  Right ventricle                        Value          Reference TAPSE                                  28.6  mm       --------- RV pressure, S, DP             (H)     38    mm Hg    <=30 RV s&', lateral, S                      12.2  cm/s     ---------  Legend: (L)  and  (H)  mark values outside specified reference range.  ------------------------------------------------------------------- Prepared and Electronically Authenticated by  Leim Moose, M.D. 2019-04-04T16:26:48      CT SCANS  CT CARDIAC SCORING (SELF PAY ONLY) 02/03/2021  Addendum 02/03/2021  3:21 PM ADDENDUM REPORT: 02/03/2021 15:18  CLINICAL DATA:  Cardiovascular Disease Risk stratification  EXAM: Coronary Calcium  Score  TECHNIQUE: A gated, non-contrast computed tomography scan of the heart was performed using 3mm slice thickness. Axial images were analyzed on a dedicated workstation. Calcium  scoring of the coronary arteries was performed using the Agatston method.  FINDINGS: Coronary arteries: Normal origins.  Coronary Calcium  Score:  Left main: 128  Left anterior descending artery: 43  Left circumflex artery: 0  Right coronary artery: 2  Total: 173  Percentile: 73  Pericardium: Normal.  Ascending Aorta: Mild dilatation measuring 38mm  Non-cardiac: See separate report from [facility].  IMPRESSION: Coronary calcium  score of 173. This was 73rd percentile for age-, race-, and sex-matched  controls.  RECOMMENDATIONS: Coronary artery calcium  (CAC) score is a strong predictor of incident coronary heart disease (CHD) and provides predictive information beyond traditional risk factors. CAC scoring is reasonable to use in the decision to withhold, postpone, or initiate statin therapy in intermediate-risk or selected borderline-risk asymptomatic adults (age 40-75 years and LDL-C >=70 to <190 mg/dL) who do not have diabetes or established atherosclerotic cardiovascular disease (ASCVD).* In intermediate-risk (10-year ASCVD risk >=7.5% to <20%) adults or selected borderline-risk (10-year ASCVD risk >=  5% to <7.5%) adults in whom a CAC score is measured for the purpose of making a treatment decision the following recommendations have been made:  If CAC=0, it is reasonable to withhold statin therapy and reassess in 5 to 10 years, as long as higher risk conditions are absent (diabetes mellitus, family history of premature CHD in first degree relatives (males <55 years; females <65 years), cigarette smoking, or LDL >=190 mg/dL).  If CAC is 1 to 99, it is reasonable to initiate statin therapy for patients >=59 years of age.  If CAC is >=100 or >=75th percentile, it is reasonable to initiate statin therapy at any age.  Cardiology referral should be considered for patients with CAC scores >=400 or >=75th percentile.  *2018 AHA/ACC/AACVPR/AAPA/ABC/ACPM/ADA/AGS/APhA/ASPC/NLA/PCNA Guideline on the Management of Blood Cholesterol: A Report of the American College of Cardiology/American Heart Association Task Force on Clinical Practice Guidelines. J Am Coll Cardiol. 2019;73(24):3168-3209.  Lonni Nanas, MD   Electronically Signed By: Lonni Nanas MD On: 02/03/2021 15:18  Narrative EXAM: OVER-READ INTERPRETATION  CT CHEST  The following report is an over-read performed by radiologist Dr. Rockey Kilts of [facility] on 02/03/2021. This  over-read does not include interpretation of cardiac or coronary anatomy or pathology. The calcium  score interpretation by the cardiologist is attached.  COMPARISON:  12/30/2020 chest radiograph.  FINDINGS: Vascular: Aortic atherosclerosis.  Tortuous thoracic aorta.  Mediastinum/Nodes: Calcified right hilar node is consistent with old granulomatous disease.  Lungs/Pleura: No pleural fluid. Calcified granulomas including the right middle and right lower lobes.  2 mm subpleural left upper lobe pulmonary nodules on 15/3 and 17/3.  Upper Abdomen: Old granulomatous disease in the liver and spleen. Normal imaged portions of the stomach, kidneys, adrenal glands, kidneys.  Musculoskeletal: Convex right thoracic spine curvature.  IMPRESSION: 1.  No acute findings in the imaged extracardiac chest. 2. Aortic Atherosclerosis (ICD10-I70.0). 3. Tiny left upper lobe pulmonary nodules. No follow-up needed if patient is low-risk. Non-contrast chest CT can be considered in 12 months if patient is high-risk. This recommendation follows the consensus statement: Guidelines for Management of Incidental Pulmonary Nodules Detected on CT Images: From the Fleischner Society 2017; Radiology 2017; 284:228-243.  Electronically Signed: By: Rockey Kilts M.D. On: 02/03/2021 11:18     ______________________________________________________________________________________________     Recent Labs: 01/06/2024: ALT 15; BUN 11; Creatinine, Ser 0.57; Hemoglobin 12.1; Platelets 268.0; Potassium 4.0; Sodium 138; TSH 1.62  Recent Lipid Panel    Component Value Date/Time   CHOL 154 01/06/2024 0955   CHOL 140 09/13/2019 1001   TRIG 50.0 01/06/2024 0955   HDL 69.20 01/06/2024 0955   HDL 89 09/13/2019 1001   CHOLHDL 2 01/06/2024 0955   VLDL 10.0 01/06/2024 0955   LDLCALC 74 01/06/2024 0955   LDLCALC 41 09/13/2019 1001    History of Present Illness    78 year old female with the above past medical history  including elevated coronary artery calcium  score, mitral valve regurgitation, hypertension, and hyperlipidemia.  Previously followed by Dr. Claudene, transitioned care to Dr. Court.  Echocardiogram in 2019 showed EF 55 to 60%, normal LV systolic function, no RWMA, G1 DD, normal RV systolic function, mild mitral valve regurgitation, mild tricuspid valve regurgitation.  ETT in 2019 was negative for ischemia.  CT calcium  score in 2022 was 173 (73rd percentile).  She was last seen in the office on 03/21/2024 and was stable overall from a cardiac standpoint.  She did report elevated BP, symptoms compatible with GERD.  She denied chest pain.  It was noted that should she have progressive symptoms, ischemic evaluation should be considered (possible coronary CTA).  She presents today for follow-up.  Since her last visit  Elevated coronary artery calcium  score: Mitral valve regurgitation:  Hypertension: Hyperlipidemia: Disposition:  Home Medications    Current Outpatient Medications  Medication Sig Dispense Refill   aspirin 81 MG EC tablet Take 81 mg by mouth daily.     CALCIUM  PO Take by mouth. Pt states taking 1000 MG daily     carvedilol  (COREG ) 12.5 MG tablet Take 1 tablet (12.5 mg total) by mouth 2 (two) times daily with a meal. 180 tablet 3   hydrochlorothiazide  (HYDRODIURIL ) 25 MG tablet Take 0.5 tablets (12.5 mg total) by mouth daily. 45 tablet 3   MAGNESIUM GLYCINATE PO Take by mouth.     Multiple Vitamin (MULTIVITAMIN) capsule Take 1 capsule by mouth daily.     olmesartan  (BENICAR ) 40 MG tablet Take 1 tablet by mouth once daily 90 tablet 0   rosuvastatin  (CRESTOR ) 20 MG tablet Take 1 tablet by mouth once daily 90 tablet 3   VITAMIN D  PO Take by mouth. Pt states taking 600 g daily     No current facility-administered medications for this visit.     Review of Systems    ***.  All other systems reviewed and are otherwise negative except as noted above.    Physical Exam    VS:  There  were no vitals taken for this visit. , BMI There is no height or weight on file to calculate BMI.     GEN: Well nourished, well developed, in no acute distress. HEENT: normal. Neck: Supple, no JVD, carotid bruits, or masses. Cardiac: RRR, no murmurs, rubs, or gallops. No clubbing, cyanosis, edema.  Radials/DP/PT 2+ and equal bilaterally.  Respiratory:  Respirations regular and unlabored, clear to auscultation bilaterally. GI: Soft, nontender, nondistended, BS + x 4. MS: no deformity or atrophy. Skin: warm and dry, no rash. Neuro:  Strength and sensation are intact. Psych: Normal affect.  Accessory Clinical Findings    ECG personally reviewed by me today -    - no acute changes.   Lab Results  Component Value Date   WBC 5.7 01/06/2024   HGB 12.1 01/06/2024   HCT 36.3 01/06/2024   MCV 96.6 01/06/2024   PLT 268.0 01/06/2024   Lab Results  Component Value Date   CREATININE 0.57 01/06/2024   BUN 11 01/06/2024   NA 138 01/06/2024   K 4.0 01/06/2024   CL 99 01/06/2024   CO2 33 (H) 01/06/2024   Lab Results  Component Value Date   ALT 15 01/06/2024   AST 18 01/06/2024   ALKPHOS 56 01/06/2024   BILITOT 0.5 01/06/2024   Lab Results  Component Value Date   CHOL 154 01/06/2024   HDL 69.20 01/06/2024   LDLCALC 74 01/06/2024   TRIG 50.0 01/06/2024   CHOLHDL 2 01/06/2024    Lab Results  Component Value Date   HGBA1C 5.9 03/06/2018    Assessment & Plan    1.  ***  No BP recorded.  {Refresh Note OR Click here to enter BP  :1}***   Damien JAYSON Braver, NP 04/30/2024, 6:36 AM

## 2024-05-07 ENCOUNTER — Ambulatory Visit: Admitting: Cardiology

## 2024-05-10 ENCOUNTER — Encounter: Payer: Self-pay | Admitting: Family Medicine

## 2024-05-11 ENCOUNTER — Other Ambulatory Visit: Payer: Self-pay | Admitting: Family Medicine

## 2024-05-11 DIAGNOSIS — I10 Essential (primary) hypertension: Secondary | ICD-10-CM

## 2024-05-22 ENCOUNTER — Encounter: Payer: Self-pay | Admitting: Nurse Practitioner

## 2024-05-22 ENCOUNTER — Ambulatory Visit: Attending: Cardiology | Admitting: Nurse Practitioner

## 2024-05-22 VITALS — BP 142/86 | HR 67 | Ht 61.0 in | Wt 114.2 lb

## 2024-05-22 DIAGNOSIS — I10 Essential (primary) hypertension: Secondary | ICD-10-CM

## 2024-05-22 DIAGNOSIS — I340 Nonrheumatic mitral (valve) insufficiency: Secondary | ICD-10-CM

## 2024-05-22 DIAGNOSIS — E785 Hyperlipidemia, unspecified: Secondary | ICD-10-CM | POA: Diagnosis not present

## 2024-05-22 DIAGNOSIS — R072 Precordial pain: Secondary | ICD-10-CM

## 2024-05-22 DIAGNOSIS — R931 Abnormal findings on diagnostic imaging of heart and coronary circulation: Secondary | ICD-10-CM

## 2024-05-22 MED ORDER — HYDROCHLOROTHIAZIDE 25 MG PO TABS: 25.0000 mg | ORAL_TABLET | Freq: Every day | ORAL | 3 refills | Status: AC

## 2024-05-22 MED ORDER — METOPROLOL TARTRATE 100 MG PO TABS: ORAL_TABLET | ORAL | 0 refills | Status: DC

## 2024-05-22 NOTE — Patient Instructions (Addendum)
 Medication Instructions:  Increase Hydrochlorothiazide  25 mg daily Metoprolol  Tartrate (Take 1 tablet 2 hours prior to cardiac ct) *If you need a refill on your cardiac medications before your next appointment, please call your pharmacy*  Lab Work: BMET today or 1 week prior to cardiac CT  Testing/Procedures: Your physician has requested that you have an echocardiogram. Echocardiography is a painless test that uses sound waves to create images of your heart. It provides your doctor with information about the size and shape of your heart and how well your heart's chambers and valves are working. This procedure takes approximately one hour. There are no restrictions for this procedure. Please do NOT wear cologne, perfume, aftershave, or lotions (deodorant is allowed). Please arrive 15 minutes prior to your appointment time.  Please note: We ask at that you not bring children with you during ultrasound (echo/ vascular) testing. Due to room size and safety concerns, children are not allowed in the ultrasound rooms during exams. Our front office staff cannot provide observation of children in our lobby area while testing is being conducted. An adult accompanying a patient to their appointment will only be allowed in the ultrasound room at the discretion of the ultrasound technician under special circumstances. We apologize for any inconvenience.     Your cardiac CT will be scheduled at one of the below locations:   [facility] [address] [place], [place] [address] [id number]  [facility] [address]  [place], [place] [address]  If scheduled at [facility], please arrive at the [facility] and Children's Entrance (Entrance C2) of [facility] 30 minutes prior to test start time. You can use the FREE valet parking offered at entrance C (encouraged to control the heart rate for the test)  Proceed to the [facility]  Department (first floor) to check-in and test prep.   All radiology patients and guests should use entrance C2 at [facility], accessed from [facility], even though the hospital's physical address listed is [address].    If scheduled at the Heart and Vascular Tower at [address], please enter the parking lot using the [address] entrance and use the FREE valet service at the patient drop-off area. Enter the buidling and check-in with registration on the main floor.  If scheduled at [facility] or [facility], please arrive 15 mins early for check-in and test prep.  There is spacious parking and easy access to the radiology department from the [facility] entrance. Please enter here and check-in with the desk attendant.   If scheduled at [facility], please arrive 30 minutes early for check-in and test prep.  Please follow these instructions carefully (unless otherwise directed):  An IV will be required for this test and Nitroglycerin  will be given.  Hold all erectile dysfunction medications at least 3 days (72 hrs) prior to test. (Ie viagra, cialis, sildenafil, tadalafil, etc)   On the Night Before the Test: Be sure to Drink plenty of water. Do not consume any caffeinated/decaffeinated beverages or chocolate 12 hours prior to your test. Do not take any antihistamines 12 hours prior to your test. If the patient has contrast allergy: Patient will need a prescription for Prednisone  and very clear instructions (as follows): Prednisone  50 mg - take 13 hours prior to test Take another Prednisone  50 mg 7 hours prior to test Take another Prednisone  50 mg  1 hour prior to test Take Benadryl 50 mg 1 hour prior to test Patient must complete all four doses of above prophylactic medications. Patient will need a ride after test due to Benadryl.  On the Day of the Test: Drink plenty of  water until 1 hour prior to the test. Do not eat any food 1 hour prior to test. You may take your regular medications prior to the test.  Take metoprolol  (Lopressor ) two hours prior to test. If you take Furosemide /Hydrochlorothiazide /Spironolactone/Chlorthalidone , please HOLD on the morning of the test. Patients who wear a continuous glucose monitor MUST remove the device prior to scanning. FEMALES- please wear underwire-free bra if available, avoid dresses & tight clothing      After the Test: Drink plenty of water. After receiving IV contrast, you may experience a mild flushed feeling. This is normal. On occasion, you may experience a mild rash up to 24 hours after the test. This is not dangerous. If this occurs, you can take Benadryl 25 mg, Zyrtec, Claritin , or Allegra and increase your fluid intake. (Patients taking Tikosyn should avoid Benadryl, and may take Zyrtec, Claritin , or Allegra) If you experience trouble breathing, this can be serious. If it is severe call 911 IMMEDIATELY. If it is mild, please call our office.  We will call to schedule your test 2-4 weeks out understanding that some insurance companies will need an authorization prior to the service being performed.   For more information and frequently asked questions, please visit our website : [email or web address]  For non-scheduling related questions, please contact the cardiac imaging nurse navigator should you have any questions/concerns: Cardiac Imaging Nurse Navigators Direct Office Dial: [phone]   For scheduling needs, including cancellations and rescheduling, please call [place], [phone].   Follow-Up: At [facility], you and your health needs are our priority.  As part of our continuing mission to provide you with exceptional heart care, our providers are all part of one team.  This team includes your primary Cardiologist (physician) and Advanced Practice Providers or APPs (Physician  Assistants and Nurse Practitioners) who all work together to provide you with the care you need, when you need it.  Your next appointment:   6-8 weeks   Provider:   Dorn Lesches, MD or Damien Braver, NP          We recommend signing up for the patient portal called MyChart.  Sign up information is provided on this After Visit Summary.  MyChart is used to connect with patients for Virtual Visits (Telemedicine).  Patients are able to view lab/test results, encounter notes, upcoming appointments, etc.  Non-urgent messages can be sent to your provider as well.   To learn more about what you can do with MyChart, go to [email or web address].   Other Instructions

## 2024-05-22 NOTE — Progress Notes (Unsigned)
 Office Visit    Patient Name: Natasha Trujillo Date of Encounter: 05/22/2024  Primary Care Provider:  Antonio Meth, Jamee SAUNDERS, DO Primary Cardiologist:  Dorn Lesches, MD  Chief Complaint    78 year old female with a history of elevated coronary artery calcium  score, mitral valve regurgitation, hypertension, and hyperlipidemia who presents for follow-up related to CAD and hypertension.  Past Medical History    Past Medical History:  Diagnosis Date   Hyperlipidemia    Hypertension    Osteopenia    Osteoporosis    Past Surgical History:  Procedure Laterality Date   BLEPHAROPLASTY Bilateral 08/2016   Dr Reyes Pizza   KNEE SURGERY Left 09/12/13   SHOULDER SURGERY Right 01/05/2017   Dr.Caffrey   TUBAL LIGATION      Allergies  Allergies  Allergen Reactions   Lisinopril  Other (See Comments)    Throat swelling   Adrenalin [Epinephrine] Other (See Comments)    dizziness   Amlodipine      LE edema on 10mg  dose, tolerates 5mg  dose well   Atorvastatin      Cramping in legs on 40mg  dose     Labs/Other Studies Reviewed    The following studies were reviewed today:  Cardiac Studies & Procedures   ______________________________________________________________________________________________   STRESS TESTS  EXERCISE TOLERANCE TEST (ETT) 02/09/2018  Interpretation Summary  Blood pressure demonstrated a normal response to exercise.  There was no ST segment deviation noted during stress.  No T wave inversion was noted during stress.  Negative, adequate stress test.   ECHOCARDIOGRAM  ECHOCARDIOGRAM COMPLETE 02/09/2018  Narrative *[facility]* [address] [place], [place] [address] [phone]  ------------------------------------------------------------------- Transthoracic Echocardiography  Patient:    Natasha, Trujillo MR #:       [id number] Study Date: 02/09/2018 Gender:     F Age:        71 Height:     157.5 cm Weight:     54.9  kg BSA:        1.55 m^2 Pt. Status: Room:  ATTENDING    Victory MICAEL Sharps, MD TISA     Victory MICAEL Sharps, MD REFERRING    Victory MICAEL Sharps, MD SONOGRAPHER  Sherida Lawyer, RDCS PERFORMING   [facility], Outpatient  cc:  ------------------------------------------------------------------- LV EF: 55% -   60%  ------------------------------------------------------------------- Indications:      Hypertension (I10).  ------------------------------------------------------------------- History:   Risk factors:  Hypertension. Dyslipidemia.  ------------------------------------------------------------------- Study Conclusions  - Left ventricle: The cavity size was normal. Systolic function was normal. The estimated ejection fraction was in the range of 55% to 60%. Wall motion was normal; there were no regional wall motion abnormalities. Doppler parameters are consistent with abnormal left ventricular relaxation (grade 1 diastolic dysfunction). There was no evidence of elevated ventricular filling pressure by Doppler parameters. - Aortic valve: There was no regurgitation. - Mitral valve: There was mild regurgitation. - Right ventricle: The cavity size was normal. Wall thickness was normal. Systolic function was normal. - Right atrium: The atrium was normal in size. - Tricuspid valve: There was mild regurgitation. - Pulmonary arteries: Systolic pressure was mildly increased. PA peak pressure: 38 mm Hg (S). - Inferior vena cava: The vessel was normal in size. - Pericardium, extracardiac: There was no pericardial effusion.  ------------------------------------------------------------------- Study data:  No prior study was available for comparison.  Study status:  Routine.  Procedure:  The patient reported no pain pre or post test. Transthoracic echocardiography. Image quality was adequate.  Transthoracic echocardiography.  M-mode, complete 2D, spectral Doppler, and color Doppler.   Birthdate: Patient birthdate: Aug 31, 1946.  Age:  Patient is 78 yr old.  Sex: Gender: female.    BMI: 22.1 kg/m^2.  Blood pressure:     150/84 Patient status:  Outpatient.  Study date:  Study date: 02/09/2018. Study time: 09:54 AM.  Location:  [facility]  -------------------------------------------------------------------  ------------------------------------------------------------------- Left ventricle:  The cavity size was normal. Systolic function was normal. The estimated ejection fraction was in the range of 55% to 60%. Wall motion was normal; there were no regional wall motion abnormalities. Doppler parameters are consistent with abnormal left ventricular relaxation (grade 1 diastolic dysfunction). There was no evidence of elevated ventricular filling pressure by Doppler parameters.  ------------------------------------------------------------------- Aortic valve:   Trileaflet; normal thickness leaflets. Mobility was not restricted.  Doppler:  Transvalvular velocity was within the normal range. There was no stenosis. There was no regurgitation.  ------------------------------------------------------------------- Aorta:  Aortic root: The aortic root was normal in size.  ------------------------------------------------------------------- Mitral valve:   Structurally normal valve.   Mobility was not restricted.  Doppler:  Transvalvular velocity was within the normal range. There was no evidence for stenosis. There was mild regurgitation.    Valve area by pressure half-time: 3.55 cm^2. Indexed valve area by pressure half-time: 2.28 cm^2/m^2.    Peak gradient (D): 3 mm Hg.  ------------------------------------------------------------------- Left atrium:  The atrium was normal in size.  ------------------------------------------------------------------- Right ventricle:  The cavity size was normal. Wall thickness was normal. Systolic function was  normal.  ------------------------------------------------------------------- Pulmonic valve:    Structurally normal valve.   Cusp separation was normal.  Doppler:  Transvalvular velocity was within the normal range. There was no evidence for stenosis. There was no regurgitation.  ------------------------------------------------------------------- Tricuspid valve:   Structurally normal valve.    Doppler: Transvalvular velocity was within the normal range. There was mild regurgitation.  ------------------------------------------------------------------- Pulmonary artery:   The main pulmonary artery was normal-sized. Systolic pressure was mildly increased.  ------------------------------------------------------------------- Right atrium:  The atrium was normal in size.  ------------------------------------------------------------------- Pericardium:  There was no pericardial effusion.  ------------------------------------------------------------------- Systemic veins: Inferior vena cava: The vessel was normal in size.  ------------------------------------------------------------------- Measurements  Left ventricle                         Value          Reference LV ID, ED, PLAX chordal                47    mm       43 - 52 LV ID, ES, PLAX chordal                30    mm       23 - 38 LV fx shortening, PLAX chordal         36    %        >=29 LV PW thickness, ED                    8     mm       --------- IVS/LV PW ratio, ED                    1              <=1.3 Stroke volume, 2D  93    ml       --------- Stroke volume/bsa, 2D                  60    ml/m^2   --------- LV e&', lateral                         7.9   cm/s     --------- LV E/e&', lateral                       10.75          --------- LV e&', medial                          4.6   cm/s     --------- LV E/e&', medial                        18.46          --------- LV e&', average                          6.25  cm/s     --------- LV E/e&', average                       13.58          ---------  Ventricular septum                     Value          Reference IVS thickness, ED                      8     mm       ---------  LVOT                                   Value          Reference LVOT ID, S                             21    mm       --------- LVOT area                              3.46  cm^2     --------- LVOT peak velocity, S                  117   cm/s     --------- LVOT mean velocity, S                  77.6  cm/s     --------- LVOT VTI, S                            26.8  cm       --------- LVOT peak gradient, S                  5     mm Hg    ---------  Aorta  Value          Reference Aortic root ID, ED                     33    mm       ---------  Left atrium                            Value          Reference LA ID, A-P, ES                         31    mm       --------- LA ID/bsa, A-P                         1.99  cm/m^2   <=2.2 LA volume, S                           43.8  ml       --------- LA volume/bsa, S                       28.2  ml/m^2   --------- LA volume, ES, 1-p A4C                 35.3  ml       --------- LA volume/bsa, ES, 1-p A4C             22.7  ml/m^2   --------- LA volume, ES, 1-p A2C                 45.4  ml       --------- LA volume/bsa, ES, 1-p A2C             29.2  ml/m^2   ---------  Mitral valve                           Value          Reference Mitral E-wave peak velocity            84.9  cm/s     --------- Mitral A-wave peak velocity            89.7  cm/s     --------- Mitral deceleration time               211   ms       150 - 230 Mitral pressure half-time              62    ms       --------- Mitral peak gradient, D                3     mm Hg    --------- Mitral E/A ratio, peak                 0.9            --------- Mitral valve area, PHT, DP             3.55  cm^2     --------- Mitral valve area/bsa,  PHT, DP         2.28  cm^2/m^2 ---------  Pulmonary arteries  Value          Reference PA pressure, S, DP             (H)     38    mm Hg    <=30  Tricuspid valve                        Value          Reference Tricuspid regurg peak velocity         274   cm/s     --------- Tricuspid peak RV-RA gradient          30    mm Hg    ---------  Systemic veins                         Value          Reference Estimated CVP                          8     mm Hg    ---------  Right ventricle                        Value          Reference TAPSE                                  28.6  mm       --------- RV pressure, S, DP             (H)     38    mm Hg    <=30 RV s&', lateral, S                      12.2  cm/s     ---------  Legend: (L)  and  (H)  mark values outside specified reference range.  ------------------------------------------------------------------- Prepared and Electronically Authenticated by  Leim Moose, M.D. 2019-04-04T16:26:48      CT SCANS  CT CARDIAC SCORING (SELF PAY ONLY) 02/03/2021  Addendum 02/03/2021  3:21 PM ADDENDUM REPORT: 02/03/2021 15:18  CLINICAL DATA:  Cardiovascular Disease Risk stratification  EXAM: Coronary Calcium  Score  TECHNIQUE: A gated, non-contrast computed tomography scan of the heart was performed using 3mm slice thickness. Axial images were analyzed on a dedicated workstation. Calcium  scoring of the coronary arteries was performed using the Agatston method.  FINDINGS: Coronary arteries: Normal origins.  Coronary Calcium  Score:  Left main: 128  Left anterior descending artery: 43  Left circumflex artery: 0  Right coronary artery: 2  Total: 173  Percentile: 73  Pericardium: Normal.  Ascending Aorta: Mild dilatation measuring 38mm  Non-cardiac: See separate report from [facility].  IMPRESSION: Coronary calcium  score of 173. This was 73rd percentile for age-, race-, and sex-matched  controls.  RECOMMENDATIONS: Coronary artery calcium  (CAC) score is a strong predictor of incident coronary heart disease (CHD) and provides predictive information beyond traditional risk factors. CAC scoring is reasonable to use in the decision to withhold, postpone, or initiate statin therapy in intermediate-risk or selected borderline-risk asymptomatic adults (age 79-75 years and LDL-C >=70 to <190 mg/dL) who do not have diabetes or established atherosclerotic cardiovascular disease (ASCVD).* In intermediate-risk (10-year ASCVD risk >=7.5% to <20%) adults or selected borderline-risk (10-year ASCVD risk >=  5% to <7.5%) adults in whom a CAC score is measured for the purpose of making a treatment decision the following recommendations have been made:  If CAC=0, it is reasonable to withhold statin therapy and reassess in 5 to 10 years, as long as higher risk conditions are absent (diabetes mellitus, family history of premature CHD in first degree relatives (males <55 years; females <65 years), cigarette smoking, or LDL >=190 mg/dL).  If CAC is 1 to 99, it is reasonable to initiate statin therapy for patients >=40 years of age.  If CAC is >=100 or >=75th percentile, it is reasonable to initiate statin therapy at any age.  Cardiology referral should be considered for patients with CAC scores >=400 or >=75th percentile.  *2018 AHA/ACC/AACVPR/AAPA/ABC/ACPM/ADA/AGS/APhA/ASPC/NLA/PCNA Guideline on the Management of Blood Cholesterol: A Report of the American College of Cardiology/American Heart Association Task Force on Clinical Practice Guidelines. J Am Coll Cardiol. 2019;73(24):3168-3209.  Lonni Nanas, MD   Electronically Signed By: Lonni Nanas MD On: 02/03/2021 15:18  Narrative EXAM: OVER-READ INTERPRETATION  CT CHEST  The following report is an over-read performed by radiologist Dr. Rockey Kilts of [facility] on 02/03/2021. This  over-read does not include interpretation of cardiac or coronary anatomy or pathology. The calcium  score interpretation by the cardiologist is attached.  COMPARISON:  12/30/2020 chest radiograph.  FINDINGS: Vascular: Aortic atherosclerosis.  Tortuous thoracic aorta.  Mediastinum/Nodes: Calcified right hilar node is consistent with old granulomatous disease.  Lungs/Pleura: No pleural fluid. Calcified granulomas including the right middle and right lower lobes.  2 mm subpleural left upper lobe pulmonary nodules on 15/3 and 17/3.  Upper Abdomen: Old granulomatous disease in the liver and spleen. Normal imaged portions of the stomach, kidneys, adrenal glands, kidneys.  Musculoskeletal: Convex right thoracic spine curvature.  IMPRESSION: 1.  No acute findings in the imaged extracardiac chest. 2. Aortic Atherosclerosis (ICD10-I70.0). 3. Tiny left upper lobe pulmonary nodules. No follow-up needed if patient is low-risk. Non-contrast chest CT can be considered in 12 months if patient is high-risk. This recommendation follows the consensus statement: Guidelines for Management of Incidental Pulmonary Nodules Detected on CT Images: From the Fleischner Society 2017; Radiology 2017; 284:228-243.  Electronically Signed: By: Rockey Kilts M.D. On: 02/03/2021 11:18     ______________________________________________________________________________________________     Recent Labs: 01/06/2024: ALT 15; BUN 11; Creatinine, Ser 0.57; Hemoglobin 12.1; Platelets 268.0; Potassium 4.0; Sodium 138; TSH 1.62  Recent Lipid Panel    Component Value Date/Time   CHOL 154 01/06/2024 0955   CHOL 140 09/13/2019 1001   TRIG 50.0 01/06/2024 0955   HDL 69.20 01/06/2024 0955   HDL 89 09/13/2019 1001   CHOLHDL 2 01/06/2024 0955   VLDL 10.0 01/06/2024 0955   LDLCALC 74 01/06/2024 0955   LDLCALC 41 09/13/2019 1001    History of Present Illness    78 year old female with the above past medical history  including elevated coronary artery calcium  score, mitral valve regurgitation, hypertension, and hyperlipidemia.  Previously followed by Dr. Claudene, she later transitioned care to Dr. Court.  Echocardiogram in 2019 showed EF 55 to 60%, normal LV systolic function, no RWMA, G1 DD, normal RV systolic function, mild mitral valve regurgitation, mild tricuspid valve regurgitation.  ETT in 2019 was negative for ischemia.  CT calcium  score in 2022 was 173 (73rd percentile).  She was last seen in the office on 03/21/2024 and was stable overall from a cardiac standpoint.  She did report elevated BP, symptoms compatible with GERD.  She denied  chest pain.  It was noted that should she have progressive symptoms, ischemic evaluation should be considered (possible coronary CTA).  She presents today for follow-up.  Since her last visit She has been stable from a cardiac standpoint.  However, she continues to note intermittent chest discomfort, lasts less than 30 seconds and resolves spontaneously.  She has symptoms 1 time a week symptoms occur both at rest and with exertion.  She denies any significant dyspnea, she has had some mild nonpitting bilateral lower extremity edema.  Through shared decision making, will pursue coronary CT angiogram.  Will update echocardiogram in the setting of mitral valve regurgitation.  BP has remained somewhat elevated.  Will increase HCTZ to 25 mg daily.  Will check BMET today.  Follow-up in 6 to 8 weeks, sooner if needed.  Elevated coronary artery calcium  score: Mitral valve regurgitation:  Hypertension: Hyperlipidemia: Disposition:  Home Medications    Current Outpatient Medications  Medication Sig Dispense Refill   aspirin 81 MG EC tablet Take 81 mg by mouth daily. (Patient taking differently: Take 81 mg by mouth. Patient states sometimes.)     CALCIUM  PO Take by mouth. Pt states taking 1000 MG daily     carvedilol  (COREG ) 12.5 MG tablet Take 1 tablet (12.5 mg total) by mouth 2  (two) times daily with a meal. 180 tablet 3   hydrochlorothiazide  (HYDRODIURIL ) 25 MG tablet Take 0.5 tablets (12.5 mg total) by mouth daily. 45 tablet 3   MAGNESIUM GLYCINATE PO Take by mouth. (Patient taking differently: Take 665 mg by mouth daily.)     Multiple Vitamin (MULTIVITAMIN) capsule Take 1 capsule by mouth daily.     olmesartan  (BENICAR ) 40 MG tablet Take 1 tablet (40 mg total) by mouth daily. 90 tablet 0   rosuvastatin  (CRESTOR ) 20 MG tablet Take 1 tablet by mouth once daily 90 tablet 3   VITAMIN D  PO Take by mouth. Pt states taking 600 g daily (Patient taking differently: Take 600 mg by mouth daily.)     No current facility-administered medications for this visit.     Review of Systems    ***.  All other systems reviewed and are otherwise negative except as noted above.    Physical Exam    VS:  BP (!) 142/86   Pulse 67   Ht 5' 1 (1.549 m)   Wt 114 lb 3.2 oz (51.8 kg)   SpO2 96%   BMI 21.58 kg/m  , BMI Body mass index is 21.58 kg/m.     GEN: Well nourished, well developed, in no acute distress. HEENT: normal. Neck: Supple, no JVD, carotid bruits, or masses. Cardiac: RRR, no murmurs, rubs, or gallops. No clubbing, cyanosis, edema.  Radials/DP/PT 2+ and equal bilaterally.  Respiratory:  Respirations regular and unlabored, clear to auscultation bilaterally. GI: Soft, nontender, nondistended, BS + x 4. MS: no deformity or atrophy. Skin: warm and dry, no rash. Neuro:  Strength and sensation are intact. Psych: Normal affect.  Accessory Clinical Findings    ECG personally reviewed by me today -    - no acute changes.   Lab Results  Component Value Date   WBC 5.7 01/06/2024   HGB 12.1 01/06/2024   HCT 36.3 01/06/2024   MCV 96.6 01/06/2024   PLT 268.0 01/06/2024   Lab Results  Component Value Date   CREATININE 0.57 01/06/2024   BUN 11 01/06/2024   NA 138 01/06/2024   K 4.0 01/06/2024   CL 99 01/06/2024   CO2 33 (  H) 01/06/2024   Lab Results  Component  Value Date   ALT 15 01/06/2024   AST 18 01/06/2024   ALKPHOS 56 01/06/2024   BILITOT 0.5 01/06/2024   Lab Results  Component Value Date   CHOL 154 01/06/2024   HDL 69.20 01/06/2024   LDLCALC 74 01/06/2024   TRIG 50.0 01/06/2024   CHOLHDL 2 01/06/2024    Lab Results  Component Value Date   HGBA1C 5.9 03/06/2018    Assessment & Plan    1.  ***  The patient's 1st BP is elevated (>139/89)*** Repeat BP and {Click to enter a 2nd BP Refresh Note  :1}   Damien JAYSON Braver, NP 05/22/2024, 2:50 PM

## 2024-05-23 ENCOUNTER — Telehealth (HOSPITAL_COMMUNITY): Payer: Self-pay | Admitting: *Deleted

## 2024-05-23 ENCOUNTER — Encounter: Payer: Self-pay | Admitting: Nurse Practitioner

## 2024-05-23 ENCOUNTER — Ambulatory Visit: Payer: Self-pay | Admitting: Cardiology

## 2024-05-23 LAB — BASIC METABOLIC PANEL WITH GFR
BUN/Creatinine Ratio: 20 (ref 12–28)
BUN: 11 mg/dL (ref 8–27)
CO2: 22 mmol/L (ref 20–29)
Calcium: 9.4 mg/dL (ref 8.7–10.3)
Chloride: 98 mmol/L (ref 96–106)
Creatinine, Ser: 0.56 mg/dL — ABNORMAL LOW (ref 0.57–1.00)
Glucose: 91 mg/dL (ref 70–99)
Potassium: 4.3 mmol/L (ref 3.5–5.2)
Sodium: 136 mmol/L (ref 134–144)
eGFR: 95 mL/min/1.73 (ref >59)

## 2024-05-23 NOTE — Telephone Encounter (Signed)
 Reaching out to patient to offer assistance regarding upcoming cardiac imaging study; pt verbalizes understanding of appt date/time, parking situation and where to check in, pre-test NPO status and medications ordered, and verified current allergies; name and call back number provided for further questions should they arise Johney Frame RN Navigator Cardiac Imaging [facility] [phone] office [phone] cell

## 2024-05-24 ENCOUNTER — Ambulatory Visit (HOSPITAL_COMMUNITY)
Admission: RE | Admit: 2024-05-24 | Discharge: 2024-05-24 | Disposition: A | Discharge status: Home / Self Care | Source: Ambulatory Visit | Attending: Cardiology | Admitting: Cardiology

## 2024-05-24 DIAGNOSIS — K449 Diaphragmatic hernia without obstruction or gangrene: Secondary | ICD-10-CM | POA: Insufficient documentation

## 2024-05-24 DIAGNOSIS — R072 Precordial pain: Secondary | ICD-10-CM | POA: Insufficient documentation

## 2024-05-24 DIAGNOSIS — R931 Abnormal findings on diagnostic imaging of heart and coronary circulation: Secondary | ICD-10-CM

## 2024-05-24 DIAGNOSIS — I2510 Atherosclerotic heart disease of native coronary artery without angina pectoris: Secondary | ICD-10-CM | POA: Insufficient documentation

## 2024-05-24 MED ORDER — IOHEXOL 350 MG/ML SOLN
100.0000 mL | Freq: Once | INTRAVENOUS | Status: AC | PRN
  Administered 2024-05-24: 100 mL via INTRAVENOUS

## 2024-05-24 MED ORDER — NITROGLYCERIN 0.4 MG SL SUBL
0.8000 mg | SUBLINGUAL_TABLET | Freq: Once | SUBLINGUAL | Status: AC
  Administered 2024-05-24: 0.8 mg via SUBLINGUAL

## 2024-06-12 DIAGNOSIS — R531 Weakness: Secondary | ICD-10-CM | POA: Diagnosis not present

## 2024-06-22 DIAGNOSIS — R531 Weakness: Secondary | ICD-10-CM | POA: Diagnosis not present

## 2024-06-25 DIAGNOSIS — R531 Weakness: Secondary | ICD-10-CM | POA: Diagnosis not present

## 2024-06-27 ENCOUNTER — Ambulatory Visit (HOSPITAL_COMMUNITY)
Admission: RE | Admit: 2024-06-27 | Discharge: 2024-06-27 | Disposition: A | Discharge status: Home / Self Care | Source: Ambulatory Visit | Attending: Cardiovascular Disease | Admitting: Cardiovascular Disease

## 2024-06-27 DIAGNOSIS — I340 Nonrheumatic mitral (valve) insufficiency: Secondary | ICD-10-CM | POA: Insufficient documentation

## 2024-06-27 LAB — ECHOCARDIOGRAM COMPLETE
Area-P 1/2: 2.7 cm2
S' Lateral: 3.2 cm

## 2024-07-02 ENCOUNTER — Ambulatory Visit: Attending: Cardiovascular Disease | Admitting: Cardiovascular Disease

## 2024-07-02 ENCOUNTER — Encounter: Payer: Self-pay | Admitting: Cardiovascular Disease

## 2024-07-02 VITALS — BP 144/83 | HR 60 | Ht 61.0 in | Wt 112.7 lb

## 2024-07-02 DIAGNOSIS — I10 Essential (primary) hypertension: Secondary | ICD-10-CM

## 2024-07-02 DIAGNOSIS — R531 Weakness: Secondary | ICD-10-CM | POA: Diagnosis not present

## 2024-07-02 DIAGNOSIS — R931 Abnormal findings on diagnostic imaging of heart and coronary circulation: Secondary | ICD-10-CM | POA: Diagnosis not present

## 2024-07-02 DIAGNOSIS — E782 Mixed hyperlipidemia: Secondary | ICD-10-CM | POA: Diagnosis not present

## 2024-07-02 NOTE — Assessment & Plan Note (Signed)
 History of essential hypertension with blood pressure measured today 144/83.  She is on olmesartan , hydrochlorothiazide , carvedilol .  I did ask her to keep a 30-day blood pressure log and she will come back to see a Pharm.D. after that to review.

## 2024-07-02 NOTE — Progress Notes (Signed)
 07/02/2024 Natasha Trujillo   11-Mar-1946  [id number]  Primary Physician Natasha Cyndee Jamee JONELLE, DO Primary Cardiologist: Natasha JINNY Lesches MD Natasha Trujillo, Natasha Trujillo  HPI:  Natasha Trujillo is a 78 y.o.   thin and fit appearing married Caucasian female mother of 3, grandmother of 3 grandchildren who is husband Natasha Trujillo is also a patient of mine. She transitioned from Dr. Claudene to me after his recent retirement.  I last saw her in the office 03/16/2023.  She is retired from being in Airline pilot for [employer]. She smoked remotely as a young adult. Her risk factors include treated hypertension and hyperlipidemia as well as strong family history of heart disease. Her father had a myocardial infarction when she did not survive at age 39. Her mother had bypass surgery as did her brother and son. She has never had a heart attack or stroke. She is fairly active her husband 4 miles 5 days a week. She did have a normal 2D echo back in 2019 as well as a normal GXT. She had a coronary calcium  score performed 01/07/2021 which was elevated at 173 most of which was in the left main and LAD.   Since I saw her in the office 3 months ago she has done well.  The symptoms that she was describing of occasional chest pain shortness of breath have resolved.  A 2D echocardiogram performed 06/27/2024 revealed normal LV systolic function, grade 1 diastolic dysfunction with mild MR.  A coronary CTA performed 05/26/2024 revealed a coronary calcium  score of 245 with mild nonobstructive CAD.   Current Meds  Medication Sig   aspirin 81 MG EC tablet Take 81 mg by mouth daily. (Patient taking differently: Take 81 mg by mouth. Patient states sometimes.)   CALCIUM  PO Take by mouth. Pt states taking 1000 MG daily   carvedilol  (COREG ) 12.5 MG tablet Take 1 tablet (12.5 mg total) by mouth 2 (two) times daily with a meal.   hydrochlorothiazide  (HYDRODIURIL ) 25 MG tablet Take 1 tablet (25 mg total) by mouth daily.   MAGNESIUM GLYCINATE  PO Take by mouth. (Patient taking differently: Take 665 mg by mouth daily.)   Multiple Vitamin (MULTIVITAMIN) capsule Take 1 capsule by mouth daily.   olmesartan  (BENICAR ) 40 MG tablet Take 1 tablet (40 mg total) by mouth daily.   rosuvastatin  (CRESTOR ) 20 MG tablet Take 1 tablet by mouth once daily   VITAMIN D  PO Take by mouth. Pt states taking 600 g daily (Patient taking differently: Take 600 mg by mouth daily.)     Allergies  Allergen Reactions   Lisinopril  Other (See Comments)    Throat swelling   Adrenalin [Epinephrine] Other (See Comments)    dizziness   Amlodipine      LE edema on 10mg  dose, tolerates 5mg  dose well   Atorvastatin      Cramping in legs on 40mg  dose    Social History   Socioeconomic History   Marital status: Married    Spouse name: Not on file   Number of children: Not on file   Years of education: Not on file   Highest education level: 12th grade  Occupational History   Occupation: retired    Associate Professor: RETIRED  Tobacco Use   Smoking status: Never   Smokeless tobacco: Never  Vaping Use   Vaping status: Never Used  Substance and Sexual Activity   Alcohol use: Yes    Comment: occasional   Drug use: No   Sexual activity:  Yes    Partners: Male  Other Topics Concern   Not on file  Social History Narrative   Exercise--walk 4 miles 5 days a week   Social Drivers of Corporate investment banker Strain: Low Risk  (01/01/2024)   Overall Financial Resource Strain (CARDIA)    Difficulty of Paying Living Expenses: Not hard at all  Food Insecurity: No Food Insecurity (01/01/2024)   Hunger Vital Sign    Worried About Running Out of Food in the Last Year: Never true    Ran Out of Food in the Last Year: Never true  Transportation Needs: No Transportation Needs (01/01/2024)   PRAPARE - Administrator, Civil Service (Medical): No    Lack of Transportation (Non-Medical): No  Physical Activity: Sufficiently Active (01/01/2024)   Exercise Vital Sign     Days of Exercise per Week: 5 days    Minutes of Exercise per Session: 70 min  Stress: No Stress Concern Present (01/01/2024)   [name] of Occupational Health - Occupational Stress Questionnaire    Feeling of Stress : Not at all  Social Connections: Socially Integrated (01/01/2024)   Social Connection and Isolation Panel    Frequency of Communication with Friends and Family: Three times a week    Frequency of Social Gatherings with Friends and Family: Once a week    Attends Religious Services: More than 4 times per year    Active Member of [employer] or Organizations: Yes    Attends Engineer, structural: More than 4 times per year    Marital Status: Married  Catering manager Violence: Not on file     Review of Systems: General: negative for chills, fever, night sweats or weight changes.  Cardiovascular: negative for chest pain, dyspnea on exertion, edema, orthopnea, palpitations, paroxysmal nocturnal dyspnea or shortness of breath Dermatological: negative for rash Respiratory: negative for cough or wheezing Urologic: negative for hematuria Abdominal: negative for nausea, vomiting, diarrhea, bright red blood per rectum, melena, or hematemesis Neurologic: negative for visual changes, syncope, or dizziness All other systems reviewed and are otherwise negative except as noted above.    Blood pressure (!) 144/83, pulse 60, height 5' 1 (1.549 m), weight 112 lb 11.2 oz (51.1 kg), SpO2 97%.  General appearance: alert and no distress Neck: no adenopathy, no carotid bruit, no JVD, supple, symmetrical, trachea midline, and thyroid  not enlarged, symmetric, no tenderness/mass/nodules Lungs: clear to auscultation bilaterally Heart: regular rate and rhythm, S1, S2 normal, no murmur, click, rub or gallop Extremities: extremities normal, atraumatic, no cyanosis or edema Pulses: 2+ and symmetric Skin: Skin color, texture, turgor normal. No rashes or lesions Neurologic: Grossly  normal  EKG not performed today      ASSESSMENT AND PLAN:   Hyperlipidemia History of hyperlipidemia on statin therapy with lipid profile performed 12/29/2023 revealing a total cholesterol 154, LDL of 74 and HDL 69.  Primary hypertension History of essential hypertension with blood pressure measured today 144/83.  She is on olmesartan , hydrochlorothiazide , carvedilol .  I did ask her to keep a 30-day blood pressure log and she will come back to see a Pharm.D. after that to review.  Elevated coronary artery calcium  score Recent coronary CTA performed 05/25/2024 revealed a coronary calcium  score of 245 with mild nonobstructive CAD.  She says that since I saw her several months ago her symptoms have since resolved.     Natasha DOROTHA Lesches MD FACP,FACC,FAHA, [facility] 07/02/2024 9:17 AM

## 2024-07-02 NOTE — Assessment & Plan Note (Signed)
 History of hyperlipidemia on statin therapy with lipid profile performed 12/29/2023 revealing a total cholesterol 154, LDL of 74 and HDL 69.

## 2024-07-02 NOTE — Assessment & Plan Note (Signed)
 Recent coronary CTA performed 05/25/2024 revealed a coronary calcium  score of 245 with mild nonobstructive CAD.  She says that since I saw her several months ago her symptoms have since resolved.

## 2024-07-02 NOTE — Patient Instructions (Signed)
 Medication Instructions:  Your physician recommends that you continue on your current medications as directed. Please refer to the Current Medication list given to you today.  *If you need a refill on your cardiac medications before your next appointment, please call your pharmacy*   Follow-Up: At [facility], you and your health needs are our priority.  As part of our continuing mission to provide you with exceptional heart care, our providers are all part of one team.  This team includes your primary Cardiologist (physician) and Advanced Practice Providers or APPs (Physician Assistants and Nurse Practitioners) who all work together to provide you with the care you need, when you need it.  Your next appointment:   6 month(s)  Provider:   Damien Braver, NP         Then, Dorn Lesches, MD will plan to see you again in 12 month(s).    We recommend signing up for the patient portal called MyChart.  Sign up information is provided on this After Visit Summary.  MyChart is used to connect with patients for Virtual Visits (Telemedicine).  Patients are able to view lab/test results, encounter notes, upcoming appointments, etc.  Non-urgent messages can be sent to your provider as well.   To learn more about what you can do with MyChart, go to [email or web address].   Other Instructions Dr. Lesches has requested that you schedule an appointment with one of our clinical pharmacists for a blood pressure check appointment within the next 4-5 weeks.  If you monitor your blood pressure (BP) at home, please bring your BP cuff and your BP readings with you to this appointment  HOW TO TAKE YOUR BLOOD PRESSURE: Rest 5 minutes before taking your blood pressure. Don't smoke or drink caffeinated beverages for at least 30 minutes before. Take your blood pressure before (not after) you eat. Sit comfortably with your back supported and both feet on the floor (don't cross your legs). Elevate your  arm to heart level on a table or a desk. Use the proper sized cuff. It should fit smoothly and snugly around your bare upper arm. There should be enough room to slip a fingertip under the cuff. The bottom edge of the cuff should be 1 inch above the crease of the elbow. Ideally, take 3 measurements at one sitting and record the average.

## 2024-07-04 ENCOUNTER — Other Ambulatory Visit: Payer: Self-pay | Admitting: Cardiovascular Disease

## 2024-07-10 DIAGNOSIS — R531 Weakness: Secondary | ICD-10-CM | POA: Diagnosis not present

## 2024-07-18 DIAGNOSIS — R531 Weakness: Secondary | ICD-10-CM | POA: Diagnosis not present

## 2024-07-23 DIAGNOSIS — R531 Weakness: Secondary | ICD-10-CM | POA: Diagnosis not present

## 2024-07-31 ENCOUNTER — Encounter: Payer: Self-pay | Admitting: Internal Medicine

## 2024-08-01 ENCOUNTER — Encounter: Payer: Self-pay | Admitting: Internal Medicine

## 2024-08-01 DIAGNOSIS — R531 Weakness: Secondary | ICD-10-CM | POA: Diagnosis not present

## 2024-08-03 ENCOUNTER — Encounter: Payer: Self-pay | Admitting: Pharmacist

## 2024-08-03 ENCOUNTER — Ambulatory Visit: Attending: Cardiovascular Disease | Admitting: Pharmacist

## 2024-08-03 ENCOUNTER — Other Ambulatory Visit (HOSPITAL_COMMUNITY): Payer: Self-pay

## 2024-08-03 VITALS — BP 145/76

## 2024-08-03 DIAGNOSIS — I10 Essential (primary) hypertension: Secondary | ICD-10-CM

## 2024-08-03 MED ORDER — OMRON 3 SERIES BP MONITOR DEVI
1.0000 | Freq: Every day | 0 refills | Status: AC
  Filled 2024-08-03: qty 1, 30d supply, fill #0

## 2024-08-03 NOTE — Patient Instructions (Signed)
 No Changes made by your pharmacist Nickola Baron, PharmD at today's visit:   Bring all of your meds, your BP cuff and your record of home blood pressures to your next appointment.    HOW TO TAKE YOUR BLOOD PRESSURE AT HOME  Rest 5 minutes before taking your blood pressure.  Don't smoke or drink caffeinated beverages for at least 30 minutes before. Take your blood pressure before (not after) you eat. Sit comfortably with your back supported and both feet on the floor (don't cross your legs). Elevate your arm to heart level on a table or a desk. Use the proper sized cuff. It should fit smoothly and snugly around your bare upper arm. There should be enough room to slip a fingertip under the cuff. The bottom edge of the cuff should be 1 inch above the crease of the elbow. Ideally, take 3 measurements at one sitting and record the average.  Important lifestyle changes to control high blood pressure  Intervention  Effect on the BP  Lose extra pounds and watch your waistline Weight loss is one of the most effective lifestyle changes for controlling blood pressure. If you're overweight or obese, losing even a small amount of weight can help reduce blood pressure. Blood pressure might go down by about 1 millimeter of mercury (mm Hg) with each kilogram (about 2.2 pounds) of weight lost.  Exercise regularly As a general goal, aim for at least 30 minutes of moderate physical activity every day. Regular physical activity can lower high blood pressure by about 5 to 8 mm Hg.  Eat a healthy diet Eating a diet rich in whole grains, fruits, vegetables, and low-fat dairy products and low in saturated fat and cholesterol. A healthy diet can lower high blood pressure by up to 11 mm Hg.  Reduce salt (sodium) in your diet Even a small reduction of sodium in the diet can improve heart health and reduce high blood pressure by about 5 to 6 mm Hg.  Limit alcohol One drink equals 12 ounces of beer, 5 ounces of wine,  or 1.5 ounces of 80-proof liquor.  Limiting alcohol to less than one drink a day for women or two drinks a day for men can help lower blood pressure by about 4 mm Hg.   If you have any questions or concerns please use My Chart to send questions or call the office at [phone]

## 2024-08-03 NOTE — Progress Notes (Signed)
 Patient ID: Natasha Trujillo                 DOB: [birth year]                      MRN: [id number]      HPI: Natasha Trujillo is a 78 y.o. female referred by Dr. Court to HTN clinic. PMH is significant for  At last visit with Dr.Berry pt's BP was high pt was advised to keep log for BP. Her current BP medications are carvedilol  12.5 mg twice daily, HCTZ 25 mg daily, olmesartan  40 mg daily   Patient presented today for hypertension clinic. Brought in hoem BP log ~ 125/68 heart rate 65. Her machine is few years old. Her husband has Omron brand machine with large cuff which she does not like to use as it dose not fit well on her arm. She add salt to her food and enjoys salty snacks. Willing to cut back on salt. Does not eat out much and exercise 5 days per week ( cardio+strength training)  Current HTN meds: carvedilol  12.5 mg twice daily, HCTZ 25 mg daily, olmesartan  40 mg daily  Previously tried: amlodipine  10 mg - swelling- can tolerates 5 mg  BP goal: <130/80    Social History:  Alcohol: 3 glass per months  Smoking : never  Diet: eats mostly healthy, mainly eats home cooked dinner, add salt to food while cooking, uses some canned food doe not rinse.  Snack: chocolates, grapes, salted nuts ( 5 days per week)  Drink: back tea - 2 cups, water, no sugary beverages  Exercise: walk 4 miles 4 days a week,   Home BP readings: 125/68 heart rate 65    Wt Readings from Last 3 Encounters:  07/02/24 112 lb 11.2 oz (51.1 kg)  05/22/24 114 lb 3.2 oz (51.8 kg)  03/21/24 112 lb (50.8 kg)   BP Readings from Last 3 Encounters:  08/03/24 (!) 145/76  07/02/24 (!) 144/83  05/24/24 (!) 171/78   Pulse Readings from Last 3 Encounters:  07/02/24 60  05/24/24 62  05/22/24 67    Renal function: CrCl cannot be calculated (Patient's most recent lab result is older than the maximum 21 days allowed.).  Past Medical History:  Diagnosis Date   Hyperlipidemia    Hypertension    Osteopenia     Osteoporosis     Current Outpatient Medications on File Prior to Visit  Medication Sig Dispense Refill   aspirin 81 MG EC tablet Take 81 mg by mouth daily. (Patient taking differently: Take 81 mg by mouth. Patient states sometimes.)     CALCIUM  PO Take by mouth. Pt states taking 1000 MG daily     carvedilol  (COREG ) 12.5 MG tablet TAKE 1 TABLET BY MOUTH TWICE DAILY WITH A MEAL 180 tablet 3   hydrochlorothiazide  (HYDRODIURIL ) 25 MG tablet Take 1 tablet (25 mg total) by mouth daily. 90 tablet 3   MAGNESIUM GLYCINATE PO Take by mouth. (Patient taking differently: Take 665 mg by mouth daily.)     metoprolol  tartrate (LOPRESSOR ) 100 MG tablet Take 1 tablet 2 hours prior to cardiac CT (Patient not taking: Reported on 07/02/2024) 1 tablet 0   Multiple Vitamin (MULTIVITAMIN) capsule Take 1 capsule by mouth daily.     olmesartan  (BENICAR ) 40 MG tablet Take 1 tablet (40 mg total) by mouth daily. 90 tablet 0   rosuvastatin  (CRESTOR ) 20 MG tablet Take 1 tablet by mouth once daily 90  tablet 3   VITAMIN D  PO Take by mouth. Pt states taking 600 g daily (Patient taking differently: Take 600 mg by mouth daily.)     No current facility-administered medications on file prior to visit.    Allergies  Allergen Reactions   Lisinopril  Other (See Comments)    Throat swelling   Adrenalin [Epinephrine] Other (See Comments)    dizziness   Amlodipine      LE edema on 10mg  dose, tolerates 5mg  dose well   Atorvastatin      Cramping in legs on 40mg  dose    Blood pressure (!) 145/76.   Assessment/Plan:  1. Hypertension -  Problem  Primary Hypertension   Qualifier: Diagnosis of  By: Antonio ROSALEA Rockers  Current HTN meds: carvedilol  12.5 mg twice daily, HCTZ 25 mg daily, olmesartan  40 mg daily  Previously tried: amlodipine  10 mg - swelling- can tolerates 5 mg  BP goal: <130/80     Primary hypertension Assessment: BP is uncontrolled in office BP 145/76 mmHg heart rate 59 (goal<130/80) Current BP meds  carvedilol  12.5 mg twice daily, HCTZ 25 mg daily, olmesartan  40 mg daily Tolerates current meds well without any side effects Denies SOB, palpitation, chest pain, headaches,or swelling Salt intake needs improvement - willing to cut back on canned food and salty nuts  Exercise 5 days per week - 4 miles walks and strength training   Plan:  No meds change, pt want to try to lower salt and see the effect on BP  Continue taking carvedilol  12.5 mg twice daily, HCTZ 25 mg daily, olmesartan  40 mg daily  Home BP never validated and it is old - sent new BP monitor prescription to [facility] pharmacy  Patient to keep record of BP readings with heart rate and report to us  at the next visit Patient to see PharmD in 3 weeks for follow up  Follow up lab(s): none  In future can consider adding Norvasc  lower dose       Thank you  Robbi Blanch, Pharm.D [place] Elspeth BIRCH. [facility] [address], [place], [place] [address] Phone: [phone]; Fax: [phone]

## 2024-08-03 NOTE — Assessment & Plan Note (Addendum)
 Assessment: BP is uncontrolled in office BP 145/76 mmHg heart rate 59 (goal<130/80) Current BP meds carvedilol  12.5 mg twice daily, HCTZ 25 mg daily, olmesartan  40 mg daily Tolerates current meds well without any side effects Denies SOB, palpitation, chest pain, headaches,or swelling Salt intake needs improvement - willing to cut back on canned food and salty nuts  Exercise 5 days per week - 4 miles walks and strength training   Plan:  No meds change, pt want to try to lower salt and see the effect on BP  Continue taking carvedilol  12.5 mg twice daily, HCTZ 25 mg daily, olmesartan  40 mg daily  Home BP never validated and it is old - sent new BP monitor prescription to [facility] pharmacy  Patient to keep record of BP readings with heart rate and report to us  at the next visit Patient to see PharmD in 3 weeks for follow up  Follow up lab(s): none  In future can consider adding Norvasc  lower dose

## 2024-08-16 ENCOUNTER — Other Ambulatory Visit: Payer: Self-pay | Admitting: Family Medicine

## 2024-08-16 DIAGNOSIS — I10 Essential (primary) hypertension: Secondary | ICD-10-CM

## 2024-08-20 NOTE — Progress Notes (Unsigned)
 Patient ID: Natasha Trujillo                 DOB: 1946-02-15                      MRN: [id number]      HPI: Natasha Trujillo is a 78 y.o. female referred by Dr. Court to HTN clinic. PMH is significant for  At last visit with Dr.Berry pt's BP was high pt was advised to keep log for BP. Her current BP medications are carvedilol  12.5 mg twice daily, HCTZ 25 mg daily, olmesartan  40 mg daily   Patient presented today for hypertension clinic. Brought in home BP monitor for validation and home BP log most readings are at goal except some 135-140 range SBP in the evening readings. Has improved her diet for salt intake. Exercise regularly. She checks her evening BP before she takes her second dose of carvedilol  and she takes olmesartan  in the evening too.  Current HTN meds: carvedilol  12.5 mg twice daily, HCTZ 25 mg daily(am) olmesartan  40 mg daily (pm) Previously tried: amlodipine  10 mg - swelling- can tolerates 5 mg  BP goal: <130/80    Social History:  Alcohol: 3 glass per months  Smoking : never  Diet: eats mostly healthy, mainly eats home cooked dinner, add salt to food while cooking, uses some canned food doe not rinse.  Snack: chocolates, grapes, salted nuts ( 5 days per week)  Drink: back tea - 2 cups, water, no sugary beverages  Exercise: walk 4 miles 4 days a week,   Home BP readings: 125/68 heart rate 65    Wt Readings from Last 3 Encounters:  07/02/24 112 lb 11.2 oz (51.1 kg)  05/22/24 114 lb 3.2 oz (51.8 kg)  03/21/24 112 lb (50.8 kg)   BP Readings from Last 3 Encounters:  08/22/24 130/72  08/03/24 (!) 145/76  07/02/24 (!) 144/83   Pulse Readings from Last 3 Encounters:  08/22/24 (!) 59  07/02/24 60  05/24/24 62    Renal function: CrCl cannot be calculated (Patient's most recent lab result is older than the maximum 21 days allowed.).  Past Medical History:  Diagnosis Date   Hyperlipidemia    Hypertension    Osteopenia    Osteoporosis     Current  Outpatient Medications on File Prior to Visit  Medication Sig Dispense Refill   aspirin 81 MG EC tablet Take 81 mg by mouth daily.     Blood Pressure Monitoring (OMRON 3 SERIES BP MONITOR) DEVI Use as directed daily. 1 each 0   CALCIUM  PO Take by mouth. Pt states taking 1000 MG daily     carvedilol  (COREG ) 12.5 MG tablet TAKE 1 TABLET BY MOUTH TWICE DAILY WITH A MEAL 180 tablet 3   hydrochlorothiazide  (HYDRODIURIL ) 25 MG tablet Take 1 tablet (25 mg total) by mouth daily. 90 tablet 3   MAGNESIUM GLYCINATE PO Take by mouth. (Patient taking differently: Take 665 mg by mouth daily.)     Multiple Vitamin (MULTIVITAMIN) capsule Take 1 capsule by mouth daily.     olmesartan  (BENICAR ) 40 MG tablet Take 1 tablet by mouth once daily 90 tablet 0   rosuvastatin  (CRESTOR ) 20 MG tablet Take 1 tablet by mouth once daily 90 tablet 3   VITAMIN D  PO Take by mouth. Pt states taking 600 g daily     No current facility-administered medications on file prior to visit.    Allergies  Allergen Reactions  Lisinopril  Other (See Comments)    Throat swelling   Adrenalin [Epinephrine] Other (See Comments)    dizziness   Amlodipine      LE edema on 10mg  dose, tolerates 5mg  dose well   Atorvastatin      Cramping in legs on 40mg  dose    Blood pressure 130/72, pulse (!) 59.   Assessment/Plan:  1. Hypertension -  Problem  Primary Hypertension   Qualifier: Diagnosis of  By: Antonio ROSALEA Rockers  Current HTN meds: carvedilol  12.5 mg twice daily, HCTZ 25 mg daily, olmesartan  40 mg daily  Previously tried: amlodipine  10 mg - swelling- can tolerates 5 mg  BP goal: <130/80      Primary hypertension Assessment: BP is controlled in office BP 130/72 mmHg heart rate 59 (goal<130/80) Current BP meds carvedilol  12.5 mg twice daily, HCTZ 25 mg daily, olmesartan  40 mg daily Tolerates current meds well without any side effects Denies SOB, palpitation, chest pain, headaches,or swelling Cut back on salt intake - home BP  monitor validated today - both omron and other brand arm cuffs are accurate  Exercise 5 days per week - 4 miles walks and strength training   Plan:  No meds change, given at goal home BP readings  Continue taking carvedilol  12.5 mg twice daily, HCTZ 25 mg daily, olmesartan  40 mg daily Patient to keep record of BP readings with heart rate and report to us  at the next visit Patient to see PharmD in 4 weeks for follow up  Follow up lab(s): none  In future can consider adding Norvasc  lower dose or change hydrochlorothiazide  to chlorthalidone  for better BP response       Thank you  Robbi Blanch, Pharm.D [place] Elspeth BIRCH. [facility] [address], [place], [place] [address] Phone: [phone]; Fax: [phone]

## 2024-08-21 ENCOUNTER — Encounter: Payer: Self-pay | Admitting: Family Medicine

## 2024-08-22 ENCOUNTER — Ambulatory Visit: Attending: Cardiovascular Disease | Admitting: Pharmacist

## 2024-08-22 ENCOUNTER — Encounter: Payer: Self-pay | Admitting: Pharmacist

## 2024-08-22 VITALS — BP 130/72 | HR 59

## 2024-08-22 DIAGNOSIS — I10 Essential (primary) hypertension: Secondary | ICD-10-CM

## 2024-08-22 NOTE — Assessment & Plan Note (Signed)
 Assessment: BP is controlled in office BP 130/72 mmHg heart rate 59 (goal<130/80) Current BP meds carvedilol  12.5 mg twice daily, HCTZ 25 mg daily, olmesartan  40 mg daily Tolerates current meds well without any side effects Denies SOB, palpitation, chest pain, headaches,or swelling Cut back on salt intake - home BP monitor validated today - both omron and other brand arm cuffs are accurate  Exercise 5 days per week - 4 miles walks and strength training   Plan:  No meds change, given at goal home BP readings  Continue taking carvedilol  12.5 mg twice daily, HCTZ 25 mg daily, olmesartan  40 mg daily Patient to keep record of BP readings with heart rate and report to us  at the next visit Patient to see PharmD in 4 weeks for follow up  Follow up lab(s): none  In future can consider adding Norvasc  lower dose or change hydrochlorothiazide  to chlorthalidone  for better BP response

## 2024-08-23 ENCOUNTER — Encounter: Payer: Self-pay | Admitting: Cardiovascular Disease

## 2024-09-06 NOTE — Progress Notes (Signed)
 Natasha Trujillo                                          MRN: [id number]   09/06/2024   The [facility]BCI Quality Team Specialist reviewed this patient medical record for the purposes of chart review for care gap closure. The following were reviewed: abstraction for care gap closure-controlling blood pressure.    VBCI Quality Team

## 2024-10-02 ENCOUNTER — Ambulatory Visit: Admitting: Pharmacist

## 2024-10-02 NOTE — Progress Notes (Signed)
 Patient ID: Natasha Trujillo                 DOB: 1946/03/12                      MRN: [id number]      HPI: Natasha Trujillo is a 78 y.o. female referred by Dr. Court to HTN clinic. PMH is significant for HLD, Osteoporosis, hypertension.   visit with Dr.Berry pt's BP was high pt was advised to keep log for BP. Her current BP medications are carvedilol  12.5 mg twice daily, HCTZ 25 mg daily, olmesartan  40 mg daily  At last visit with me Brought in home BP monitor for validation and home BP log most readings are at goal except some 135-140 range SBP in the evening readings.  She checks her evening BP before she takes her second dose of carvedilol  and she takes olmesartan  in the evening too.   Today patient is presented for follow up. Brought in home BP log. Home BP at goal. Has been watching salt intake and taking BP meds regularly. Advised patient to reach out to us  if BP persistently remains above the goal.    Current HTN meds: carvedilol  12.5 mg twice daily, HCTZ 25 mg daily(am) olmesartan  40 mg daily (pm) Previously tried: amlodipine  10 mg - swelling- can tolerates 5 mg  BP goal: <130/80  Home BP monitor was validated on 08/22/2024    Social History:  Alcohol: 3 glass per months  Smoking : never  Diet: eats mostly healthy, mainly eats home cooked dinner, add salt to food while cooking, uses some canned food does not rinse.  Snack: chocolates, grapes, salted nuts ( 5 days per week)  Drink: back tea - 2 cups, water, no sugary beverages  Exercise: walk 4 miles 4 days a week,   Home BP readings: 125/68 heart rate 65    Wt Readings from Last 3 Encounters:  07/02/24 112 lb 11.2 oz (51.1 kg)  05/22/24 114 lb 3.2 oz (51.8 kg)  03/21/24 112 lb (50.8 kg)   BP Readings from Last 3 Encounters:  10/08/24 129/73  08/22/24 130/72  08/03/24 (!) 145/76   Pulse Readings from Last 3 Encounters:  10/08/24 68  08/22/24 (!) 59  07/02/24 60    Renal function: CrCl cannot be  calculated (Patient's most recent lab result is older than the maximum 21 days allowed.).  Past Medical History:  Diagnosis Date   Hyperlipidemia    Hypertension    Osteopenia    Osteoporosis     Current Outpatient Medications on File Prior to Visit  Medication Sig Dispense Refill   aspirin 81 MG EC tablet Take 81 mg by mouth daily.     Blood Pressure Monitoring (OMRON 3 SERIES BP MONITOR) DEVI Use as directed daily. 1 each 0   CALCIUM  PO Take by mouth. Pt states taking 1000 MG daily     carvedilol  (COREG ) 12.5 MG tablet TAKE 1 TABLET BY MOUTH TWICE DAILY WITH A MEAL 180 tablet 3   hydrochlorothiazide  (HYDRODIURIL ) 25 MG tablet Take 1 tablet (25 mg total) by mouth daily. 90 tablet 3   MAGNESIUM GLYCINATE PO Take by mouth. (Patient taking differently: Take 665 mg by mouth daily.)     Multiple Vitamin (MULTIVITAMIN) capsule Take 1 capsule by mouth daily.     olmesartan  (BENICAR ) 40 MG tablet Take 1 tablet by mouth once daily 90 tablet 0   rosuvastatin  (CRESTOR ) 20 MG tablet Take 1  tablet by mouth once daily 90 tablet 3   VITAMIN D  PO Take by mouth. Pt states taking 600 g daily     No current facility-administered medications on file prior to visit.    Allergies  Allergen Reactions   Lisinopril  Other (See Comments)    Throat swelling   Adrenalin [Epinephrine] Other (See Comments)    dizziness   Amlodipine      LE edema on 10mg  dose, tolerates 5mg  dose well   Atorvastatin      Cramping in legs on 40mg  dose    Blood pressure 129/73, pulse 68, SpO2 98%.   Assessment/Plan:  1. Hypertension -  Problem  Primary Hypertension   Qualifier: Diagnosis of  By: Antonio ROSALEA Rockers  Current HTN meds: carvedilol  12.5 mg twice daily, HCTZ 25 mg daily(am) olmesartan  40 mg daily (pm) Previously tried: amlodipine  10 mg - swelling- can tolerates 5 mg  BP goal: <130/80  Home BP monitor was validated on 08/22/2024       Primary hypertension Assessment: In office BP 129/73 heart rate 68  and home BP ~ 125/ 75 on validated home BP monitor (goal<130/80) Current BP meds carvedilol  12.5 mg twice daily, HCTZ 25 mg daily, olmesartan  40 mg daily Tolerates current meds well without any side effects Denies SOB, palpitation, chest pain, headaches,or swelling Watches salt intake  Exercise 5 days per week - 4 miles walks and strength training   Plan:  No meds change, given at goal home and office  BP readings  Continue taking carvedilol  12.5 mg twice daily, HCTZ 25 mg daily, olmesartan  40 mg daily Patient to keep record of BP readings with heart rate and report to us  at the next visit Patient to contact us  if home BP persistently remains above the goal (<130/80) In future can consider adding Norvasc  lower dose or change hydrochlorothiazide  to chlorthalidone  for better BP response  6 months follow up with Damien Braver, NP due Feb 2026       Thank you  Robbi Blanch, Pharm.D [facility]. [facility] [address], [place], [place] [address] Phone: [phone]; Fax: [phone]

## 2024-10-02 NOTE — Progress Notes (Deleted)
 Patient ID: Natasha Trujillo                 DOB: 1946/09/10                      MRN: [id number]      HPI: Natasha Trujillo is a 78 y.o. female referred by Dr. Court to HTN clinic. PMH is significant for HLD, Osteoporosis, hypertension.   At last visit with Dr.Berry pt's BP was high pt was advised to keep log for BP. Her current BP medications are carvedilol  12.5 mg twice daily, HCTZ 25 mg daily, olmesartan  40 mg daily  At last visit with me Brought in home BP monitor for validation and home BP log most readings are at goal except some 135-140 range SBP in the evening readings.  She checks her evening BP before she takes her second dose of carvedilol  and she takes olmesartan  in the evening too.    Current HTN meds: carvedilol  12.5 mg twice daily, HCTZ 25 mg daily(am) olmesartan  40 mg daily (pm) Previously tried: amlodipine  10 mg - swelling- can tolerates 5 mg  BP goal: <130/80  Home BP monitor was validated on 08/22/2024    Social History:  Alcohol: 3 glass per months  Smoking : never  Diet: eats mostly healthy, mainly eats home cooked dinner, add salt to food while cooking, uses some canned food does not rinse.  Snack: chocolates, grapes, salted nuts ( 5 days per week)  Drink: back tea - 2 cups, water, no sugary beverages  Exercise: walk 4 miles 4 days a week,   Home BP readings: 125/68 heart rate 65    Wt Readings from Last 3 Encounters:  07/02/24 112 lb 11.2 oz (51.1 kg)  05/22/24 114 lb 3.2 oz (51.8 kg)  03/21/24 112 lb (50.8 kg)   BP Readings from Last 3 Encounters:  08/22/24 130/72  08/03/24 (!) 145/76  07/02/24 (!) 144/83   Pulse Readings from Last 3 Encounters:  08/22/24 (!) 59  07/02/24 60  05/24/24 62    Renal function: CrCl cannot be calculated (Patient's most recent lab result is older than the maximum 21 days allowed.).  Past Medical History:  Diagnosis Date   Hyperlipidemia    Hypertension    Osteopenia    Osteoporosis     Current  Outpatient Medications on File Prior to Visit  Medication Sig Dispense Refill   aspirin 81 MG EC tablet Take 81 mg by mouth daily.     Blood Pressure Monitoring (OMRON 3 SERIES BP MONITOR) DEVI Use as directed daily. 1 each 0   CALCIUM  PO Take by mouth. Pt states taking 1000 MG daily     carvedilol  (COREG ) 12.5 MG tablet TAKE 1 TABLET BY MOUTH TWICE DAILY WITH A MEAL 180 tablet 3   hydrochlorothiazide  (HYDRODIURIL ) 25 MG tablet Take 1 tablet (25 mg total) by mouth daily. 90 tablet 3   MAGNESIUM GLYCINATE PO Take by mouth. (Patient taking differently: Take 665 mg by mouth daily.)     Multiple Vitamin (MULTIVITAMIN) capsule Take 1 capsule by mouth daily.     olmesartan  (BENICAR ) 40 MG tablet Take 1 tablet by mouth once daily 90 tablet 0   rosuvastatin  (CRESTOR ) 20 MG tablet Take 1 tablet by mouth once daily 90 tablet 3   VITAMIN D  PO Take by mouth. Pt states taking 600 g daily     No current facility-administered medications on file prior to visit.    Allergies  Allergen Reactions   Lisinopril  Other (See Comments)    Throat swelling   Adrenalin [Epinephrine] Other (See Comments)    dizziness   Amlodipine      LE edema on 10mg  dose, tolerates 5mg  dose well   Atorvastatin      Cramping in legs on 40mg  dose    There were no vitals taken for this visit.   Assessment/Plan:  1. Hypertension -  No problems updated.   No problem-specific Assessment & Plan notes found for this encounter.      Thank you  Robbi Blanch, Pharm.D  [facility]. [facility] [address], [place], [place] [address] Phone: [phone]; Fax: [phone]

## 2024-10-08 ENCOUNTER — Ambulatory Visit: Attending: Cardiology | Admitting: Pharmacist

## 2024-10-08 ENCOUNTER — Encounter: Payer: Self-pay | Admitting: Pharmacist

## 2024-10-08 VITALS — BP 129/73 | HR 68

## 2024-10-08 DIAGNOSIS — I10 Essential (primary) hypertension: Secondary | ICD-10-CM

## 2024-10-08 NOTE — Assessment & Plan Note (Signed)
 Assessment: In office BP 129/73 heart rate 68 and home BP ~ 125/ 75 on validated home BP monitor (goal<130/80) Current BP meds carvedilol  12.5 mg twice daily, HCTZ 25 mg daily, olmesartan  40 mg daily Tolerates current meds well without any side effects Denies SOB, palpitation, chest pain, headaches,or swelling Watches salt intake  Exercise 5 days per week - 4 miles walks and strength training   Plan:  No meds change, given at goal home and office  BP readings  Continue taking carvedilol  12.5 mg twice daily, HCTZ 25 mg daily, olmesartan  40 mg daily Patient to keep record of BP readings with heart rate and report to us  at the next visit Patient to contact us  if home BP persistently remains above the goal (<130/80) In future can consider adding Norvasc  lower dose or change hydrochlorothiazide  to chlorthalidone  for better BP response  6 months follow up with Damien Braver, NP due Feb 2026

## 2024-10-09 ENCOUNTER — Ambulatory Visit: Admitting: Internal Medicine

## 2024-10-11 ENCOUNTER — Ambulatory Visit: Admitting: Internal Medicine

## 2024-10-11 ENCOUNTER — Encounter: Payer: Self-pay | Admitting: Internal Medicine

## 2024-10-11 VITALS — BP 130/70 | HR 60 | Ht 61.0 in | Wt 115.0 lb

## 2024-10-11 DIAGNOSIS — R143 Flatulence: Secondary | ICD-10-CM

## 2024-10-11 DIAGNOSIS — Z860101 Personal history of adenomatous and serrated colon polyps: Secondary | ICD-10-CM

## 2024-10-11 MED ORDER — NA SULFATE-K SULFATE-MG SULF 17.5-3.13-1.6 GM/177ML PO SOLN: 1.0000 | Freq: Once | ORAL | 0 refills | Status: AC

## 2024-10-11 NOTE — Patient Instructions (Addendum)
 Diet Tips for Less Gas  To help reduce gas and bloating, try these dietary changes:  - Eat regular meals and take your time eating. Avoid skipping meals or waiting too long between meals.[1]  - Drink plenty of fluids, especially water. Aim for at least 8 cups a day. Limit tea and coffee to 3 cups daily, and cut back on alcohol and fizzy (carbonated) drinks.[1]  - Limit foods that commonly cause gas. These include beans, lentils, broccoli, cabbage, onions, and some whole grains. If you notice certain foods make your symptoms worse, try eating less of them.[2][3]  - Limit high-fiber foods if they make symptoms worse. Foods like bran, wholemeal bread, and cereals high in bran can increase gas. If you add fiber, choose soluble fiber (like oats or oat-based cereals) instead of insoluble fiber.[1]  - Eat smaller meals more often. Large meals can make bloating and gas worse.[1][3]  - Limit fresh fruit to 3 portions per day. A portion is about the size of a small apple or 80 grams.[1]  Probiotics are important for health, they are best gotten through foods.  Pills are not proven to be effective in some research is suggesting they may actually cause problems.  Here are some examples of probiotic foods (also known as fermented foods):  Yogurt-high protein whole fat yogurt is best I believe, Greek yogurt is an example.  Add your own fruit because sugar is almost always added to yogurt with fruit in it found at the store.  Kefir-kind of like liquid yogurt  Miso-an Asian fermented bean paste  Sauerkraut  Kimchi-Asian fermented cabbage  Apple cider vinegar-use the liquid not Gummies or pills.  Some people can tolerate a spoonful of this alone but many fine adding it to water reduces the acidity.  You can work up to higher doses.  If taking it   undiluted rinse your mouth or drink water afterwards to reduce risk of dental damage.  A tablespoon a day is a good dose.  You could take more also.  Some  people also find this helpful with reflux.  You have been scheduled for a colonoscopy. Please follow written instructions given to you at your visit today.   If you use inhalers (even only as needed), please bring them with you on the day of your procedure.  DO NOT TAKE 7 DAYS PRIOR TO TEST- Trulicity (dulaglutide) Ozempic, Wegovy (semaglutide) Mounjaro, Zepbound (tirzepatide) Bydureon Bcise (exanatide extended release)  DO NOT TAKE 1 DAY PRIOR TO YOUR TEST Rybelsus (semaglutide) Adlyxin (lixisenatide) Victoza (liraglutide) Byetta (exanatide) ___________________________________________________________________________  I appreciate the opportunity to care for you. Lupita Commander, MD, [facility]

## 2024-10-11 NOTE — Progress Notes (Signed)
 Natasha Trujillo 78 y.o. September 16, 1946 [id number]  Assessment & Plan:   Encounter Diagnoses  Name Primary?   Hx of adenomatous colonic polyps Yes   Flatulence    Schedule surveillance colonoscopy.The risks and benefits as well as alternatives of endoscopic procedure(s) have been discussed and reviewed. All questions answered. The patient agrees to proceed.   Diet tips to reduce flatulence provided in the AVS, I have also added a list of probiotic foods.  Explained this is a better way to get probiotics, no good evidence to support pill version of probiotic supplementation.   Subjective:  Patient consented to the use of artificial intelligence scribe  Chief Complaint: History of colon polyps  HPI 78 year old woman with a history of adenomatous colon polyps.  Last colonoscopy May 2020 with a 6 mm adenoma in 2003 she had a 12 mm adenoma.  2007 and 2014 no polyps.  She does have diverticulosis.  She would like to repeat a colonoscopy for surveillance.  Her 3 year old daughter in law was recently diagnosed with rectal cancer and is undergoing neoadjuvant chemotherapy.  Gastrointestinal symptoms - Experiences gas-related symptoms, including pain and flatulence, sometimes uncontrollable, particularly during ambulation. - Maintains a high-fiber diet and consumes yogurt regularly. - No issues with most dairy products, including cheese and yogurt, though excessive whole milk can cause symptoms. - Has tried Beano and Align probiotic with uncertain benefit. - No urinary incontinence. - Bowel movements are regular.  She remains very active.  Recently took a 16-day cruise with lots of walking up to 20,000 steps a day.  Allergies  Allergen Reactions   Lisinopril  Other (See Comments)    Throat swelling   Adrenalin [Epinephrine] Other (See Comments)    dizziness   Amlodipine      LE edema on 10mg  dose, tolerates 5mg  dose well   Atorvastatin      Cramping in legs on 40mg  dose    Current Meds  Medication Sig   aspirin 81 MG EC tablet Take 81 mg by mouth daily.   Blood Pressure Monitoring (OMRON 3 SERIES BP MONITOR) DEVI Use as directed daily.   CALCIUM  PO Take by mouth. Pt states taking 1000 MG daily   carvedilol  (COREG ) 12.5 MG tablet TAKE 1 TABLET BY MOUTH TWICE DAILY WITH A MEAL   hydrochlorothiazide  (HYDRODIURIL ) 25 MG tablet Take 1 tablet (25 mg total) by mouth daily.   MAGNESIUM GLYCINATE PO Take by mouth. (Patient taking differently: Take 665 mg by mouth daily.)   Multiple Vitamin (MULTIVITAMIN) capsule Take 1 capsule by mouth daily.   Na Sulfate-K Sulfate-Mg Sulfate concentrate (SUPREP) 17.5-3.13-1.6 GM/177ML SOLN Take 1 kit (354 mLs total) by mouth once for 1 dose.   olmesartan  (BENICAR ) 40 MG tablet Take 1 tablet by mouth once daily   rosuvastatin  (CRESTOR ) 20 MG tablet Take 1 tablet by mouth once daily   VITAMIN D  PO Take by mouth. Pt states taking 600 g daily   Past Medical History:  Diagnosis Date   Hyperlipidemia    Hypertension    Osteopenia    Osteoporosis    Past Surgical History:  Procedure Laterality Date   BLEPHAROPLASTY Bilateral 08/2016   Dr Reyes Pizza   KNEE SURGERY Left 09/12/13   SHOULDER SURGERY Right 01/05/2017   Dr.Caffrey   TUBAL LIGATION     Social History   Social History Narrative   Retired, married, 2 sons 1 daughter, also has grandchildren   Never smoker no alcohol 2-3 caffeinated beverages a day no other tobacco or  drug use      Exercise--walk 4 miles 5 days a week   family history includes Cancer (age of onset: 62) in her maternal aunt; Cervical cancer in an other family member; Coronary artery disease in an other family member; Dementia in her mother; Diabetes in an other family member; Diabetes (age of onset: 43) in her brother; Heart attack (age of onset: 33) in her son; Heart disease in her brother; Heart disease (age of onset: 14) in her father and mother; Hypertension in her brother, father, and mother;  Lung cancer in an other family member.   Review of Systems  As per HPI otherwise negative Objective:   Physical Exam @BP  130/70   Pulse 60   Ht 5' 1 (1.549 m)   Wt 115 lb (52.2 kg)   BMI 21.73 kg/m @  General:  NAD Eyes:   anicteric Lungs:  clear Heart::  S1S2 no rubs, murmurs or gallops Abdomen:  soft and nontender, BS+ Ext:   no edema, cyanosis or clubbing    Data Reviewed:  See HPI

## 2024-10-15 ENCOUNTER — Ambulatory Visit: Admitting: Internal Medicine

## 2024-10-15 ENCOUNTER — Encounter: Payer: Self-pay | Admitting: Internal Medicine

## 2024-10-15 VITALS — BP 147/75 | HR 58 | Temp 97.4°F | Resp 15 | Ht 61.0 in | Wt 115.0 lb

## 2024-10-15 DIAGNOSIS — Z860101 Personal history of adenomatous and serrated colon polyps: Secondary | ICD-10-CM

## 2024-10-15 DIAGNOSIS — K5730 Diverticulosis of large intestine without perforation or abscess without bleeding: Secondary | ICD-10-CM

## 2024-10-15 DIAGNOSIS — Z1211 Encounter for screening for malignant neoplasm of colon: Secondary | ICD-10-CM

## 2024-10-15 MED ORDER — SODIUM CHLORIDE 0.9 % IV SOLN: 500.0000 mL | Freq: Once | INTRAVENOUS | Status: DC

## 2024-10-15 NOTE — Progress Notes (Signed)
 Report given to PACU, vss

## 2024-10-15 NOTE — Progress Notes (Signed)
 Pt's states no medical or surgical changes since previsit or office visit.

## 2024-10-15 NOTE — Op Note (Signed)
 [facility] Patient Name: Natasha Trujillo Procedure Date: 10/15/2024 8:23 AM MRN: [id number] Endoscopist: Lupita FORBES Commander , MD, [id number] Age: 78 Referring MD:  Date of Birth: July 20, 1946 Gender: Female Account #: [id number] Procedure:                Colonoscopy Indications:              Last colonoscopy: May 2020 Medicines:                Monitored Anesthesia Care Procedure:                Pre-Anesthesia Assessment:                           - Prior to the procedure, a History and Physical                            was performed, and patient medications and                            allergies were reviewed. The patient's tolerance of                            previous anesthesia was also reviewed. The risks                            and benefits of the procedure and the sedation                            options and risks were discussed with the patient.                            All questions were answered, and informed consent                            was obtained. Prior Anticoagulants: The patient has                            taken no anticoagulant or antiplatelet agents. ASA                            Grade Assessment: II - A patient with mild systemic                            disease. After reviewing the risks and benefits,                            the patient was deemed in satisfactory condition to                            undergo the procedure.                           After obtaining informed consent, the colonoscope  was passed under direct vision. Throughout the                            procedure, the patient's blood pressure, pulse, and                            oxygen saturations were monitored continuously. The                            Olympus Scope [id number] was introduced through the                            anus and advanced to the the cecum, identified by                            appendiceal orifice and  ileocecal valve. The                            colonoscopy was performed without difficulty. The                            patient tolerated the procedure well. The quality                            of the bowel preparation was good. The ileocecal                            valve, appendiceal orifice, and rectum were                            photographed. The bowel preparation used was SUPREP                            via split dose instruction. Scope In: 9:22:16 AM Scope Out: 9:36:51 AM Scope Withdrawal Time: 0 hours 8 minutes 7 seconds  Total Procedure Duration: 0 hours 14 minutes 35 seconds  Findings:                 The perianal and digital rectal examinations were                            normal.                           Multiple diverticula were found in the sigmoid                            colon.                           The exam was otherwise without abnormality on                            direct and retroflexion views. Complications:            No immediate complications. Estimated Blood  Loss:     Estimated blood loss: none. Impression:               - Diverticulosis in the sigmoid colon.                           - The examination was otherwise normal on direct                            and retroflexion views.                           - No specimens collected.                           - Personal history of colonic polyps.                           - Personal history of colonic polyps. Recommendation:           - Patient has a contact number available for                            emergencies. The signs and symptoms of potential                            delayed complications were discussed with the                            patient. Return to normal activities tomorrow.                            Written discharge instructions were provided to the                            patient.                           - Resume previous diet.                            - Continue present medications.                           - No repeat colonoscopy due to age and the absence                            of colonic polyps. Lupita FORBES Commander, MD 10/15/2024 9:45:46 AM This report has been signed electronically.

## 2024-10-15 NOTE — Progress Notes (Signed)
 History and Physical Interval Note:  10/15/2024 9:14 AM  Natasha Trujillo  has presented today for endoscopic procedure(s), with the diagnosis of  Encounter Diagnosis  Name Primary?   Hx of adenomatous colonic polyps Yes  .  The various methods of evaluation and treatment have been discussed with the patient and/or family. After consideration of risks, benefits and other options for treatment, the patient has consented to  the endoscopic procedure(s).   The patient's history has been reviewed, patient examined, no change in status, stable for endoscopic procedure(s).  I have reviewed the patient's chart and labs.  Questions were answered to the patient's satisfaction.     [name], MD, [facility]

## 2024-10-15 NOTE — Patient Instructions (Addendum)
 No polyps or cancer seen.  You do have some diverticulosis, this is very common and typically not a problem for people.  Please read the handout.  No need for routine repeat colonoscopy.  We would consider colonoscopy again to investigate signs or symptoms if necessary.  I appreciate the opportunity to care for you. Natasha [name], MD, FACG   YOU HAD AN ENDOSCOPIC PROCEDURE TODAY AT THE [facility]:   Refer to the procedure report that was given to you for any specific questions about what was found during the examination.  If the procedure report does not answer your questions, please call your gastroenterologist to clarify.  If you requested that your care partner not be given the details of your procedure findings, then the procedure report has been included in a sealed envelope for you to review at your convenience later.  YOU SHOULD EXPECT: Some feelings of bloating in the abdomen. Passage of more gas than usual.  Walking can help get rid of the air that was put into your GI tract during the procedure and reduce the bloating. If you had a lower endoscopy (such as a colonoscopy or flexible sigmoidoscopy) you may notice spotting of blood in your stool or on the toilet paper. If you underwent a bowel prep for your procedure, you may not have a normal bowel movement for a few days.  Please Note:  You might notice some irritation and congestion in your nose or some drainage.  This is from the oxygen used during your procedure.  There is no need for concern and it should clear up in a day or so.  SYMPTOMS TO REPORT IMMEDIATELY:  Following lower endoscopy (colonoscopy or flexible sigmoidoscopy):  Excessive amounts of blood in the stool  Significant tenderness or worsening of abdominal pains  Swelling of the abdomen that is new, acute  Fever of 100F or higher   For urgent or emergent issues, a gastroenterologist can be reached at any hour by calling [phone]. Do not use  MyChart messaging for urgent concerns.    DIET:  We do recommend a small meal at first, but then you may proceed to your regular diet.  Drink plenty of fluids but you should avoid alcoholic beverages for 24 hours.  ACTIVITY:  You should plan to take it easy for the rest of today and you should NOT DRIVE or use heavy machinery until tomorrow (because of the sedation medicines used during the test).    FOLLOW UP: Our staff will call the number listed on your records the next business day following your procedure.  We will call around 7:15- 8:00 am to check on you and address any questions or concerns that you may have regarding the information given to you following your procedure. If we do not reach you, we will leave a message.     If any biopsies were taken you will be contacted by phone or by letter within the next 1-3 weeks.  Please call us  at [phone] if you have not heard about the biopsies in 3 weeks.    SIGNATURES/CONFIDENTIALITY: You and/or your care partner have signed paperwork which will be entered into your electronic medical record.  These signatures attest to the fact that that the information above on your After Visit Summary has been reviewed and is understood.  Full responsibility of the confidentiality of this discharge information lies with you and/or your care-partner.

## 2024-10-16 ENCOUNTER — Telehealth: Payer: Self-pay | Admitting: *Deleted

## 2024-10-16 NOTE — Telephone Encounter (Signed)
 Attempted f/u phone call. No answer. Left message.

## 2024-11-09 ENCOUNTER — Other Ambulatory Visit: Payer: Self-pay | Admitting: Family Medicine

## 2024-11-09 DIAGNOSIS — I10 Essential (primary) hypertension: Secondary | ICD-10-CM

## 2024-11-10 ENCOUNTER — Encounter: Payer: Self-pay | Admitting: Family Medicine

## 2024-11-10 DIAGNOSIS — I10 Essential (primary) hypertension: Secondary | ICD-10-CM

## 2024-11-12 MED ORDER — OLMESARTAN MEDOXOMIL 40 MG PO TABS: 40.0000 mg | ORAL_TABLET | Freq: Every day | ORAL | 0 refills | Status: AC

## 2025-01-07 ENCOUNTER — Ambulatory Visit: Admitting: Nurse Practitioner

## 2025-01-11 ENCOUNTER — Encounter: Admitting: Family Medicine
